# Patient Record
Sex: Male | Born: 1935
Health system: Southern US, Community
[De-identification: ages and names within clinical notes are randomized; demographics above are authoritative.]

## PROBLEM LIST (undated history)

## (undated) DIAGNOSIS — C801 Malignant (primary) neoplasm, unspecified: Secondary | ICD-10-CM

## (undated) DIAGNOSIS — G2581 Restless legs syndrome: Secondary | ICD-10-CM

## (undated) DIAGNOSIS — N400 Enlarged prostate without lower urinary tract symptoms: Secondary | ICD-10-CM

## (undated) DIAGNOSIS — F419 Anxiety disorder, unspecified: Secondary | ICD-10-CM

## (undated) DIAGNOSIS — I10 Essential (primary) hypertension: Secondary | ICD-10-CM

## (undated) DIAGNOSIS — F4310 Post-traumatic stress disorder, unspecified: Secondary | ICD-10-CM

## (undated) DIAGNOSIS — K219 Gastro-esophageal reflux disease without esophagitis: Secondary | ICD-10-CM

## (undated) DIAGNOSIS — R1013 Epigastric pain: Secondary | ICD-10-CM

## (undated) DIAGNOSIS — M1990 Unspecified osteoarthritis, unspecified site: Secondary | ICD-10-CM

## (undated) DIAGNOSIS — I472 Ventricular tachycardia: Secondary | ICD-10-CM

## (undated) DIAGNOSIS — F32A Depression, unspecified: Secondary | ICD-10-CM

## (undated) DIAGNOSIS — R918 Other nonspecific abnormal finding of lung field: Secondary | ICD-10-CM

## (undated) DIAGNOSIS — Z85038 Personal history of other malignant neoplasm of large intestine: Secondary | ICD-10-CM

## (undated) DIAGNOSIS — I4729 Other ventricular tachycardia: Secondary | ICD-10-CM

## (undated) DIAGNOSIS — R51 Headache: Secondary | ICD-10-CM

## (undated) DIAGNOSIS — K5790 Diverticulosis of intestine, part unspecified, without perforation or abscess without bleeding: Secondary | ICD-10-CM

## (undated) DIAGNOSIS — E119 Type 2 diabetes mellitus without complications: Secondary | ICD-10-CM

## (undated) DIAGNOSIS — D494 Neoplasm of unspecified behavior of bladder: Secondary | ICD-10-CM

## (undated) DIAGNOSIS — R519 Headache, unspecified: Secondary | ICD-10-CM

## (undated) DIAGNOSIS — D369 Benign neoplasm, unspecified site: Secondary | ICD-10-CM

## (undated) DIAGNOSIS — F329 Major depressive disorder, single episode, unspecified: Secondary | ICD-10-CM

## (undated) DIAGNOSIS — H409 Unspecified glaucoma: Secondary | ICD-10-CM

## (undated) DIAGNOSIS — D649 Anemia, unspecified: Secondary | ICD-10-CM

## (undated) DIAGNOSIS — G629 Polyneuropathy, unspecified: Secondary | ICD-10-CM

## (undated) HISTORY — DX: Anemia, unspecified: D64.9

## (undated) HISTORY — DX: Gastro-esophageal reflux disease without esophagitis: K21.9

## (undated) HISTORY — DX: Major depressive disorder, single episode, unspecified: F32.9

## (undated) HISTORY — PX: COLONOSCOPY: SHX174

## (undated) HISTORY — PX: JOINT REPLACEMENT: SHX530

## (undated) HISTORY — DX: Essential (primary) hypertension: I10

## (undated) HISTORY — PX: INNER EAR SURGERY: SHX679

## (undated) HISTORY — DX: Depression, unspecified: F32.A

## (undated) HISTORY — PX: COLON SURGERY: SHX602

## (undated) HISTORY — DX: Epigastric pain: R10.13

## (undated) HISTORY — DX: Benign neoplasm, unspecified site: D36.9

## (undated) HISTORY — PX: EYE SURGERY: SHX253

## (undated) HISTORY — DX: Polyneuropathy, unspecified: G62.9

## (undated) HISTORY — DX: Benign prostatic hyperplasia without lower urinary tract symptoms: N40.0

## (undated) HISTORY — DX: Unspecified glaucoma: H40.9

## (undated) HISTORY — DX: Malignant (primary) neoplasm, unspecified: C80.1

## (undated) HISTORY — DX: Type 2 diabetes mellitus without complications: E11.9

## (undated) HISTORY — DX: Unspecified osteoarthritis, unspecified site: M19.90

## (undated) HISTORY — PX: KNEE ARTHROSCOPY: SUR90

## (undated) HISTORY — DX: Personal history of other malignant neoplasm of large intestine: Z85.038

## (undated) HISTORY — DX: Diverticulosis of intestine, part unspecified, without perforation or abscess without bleeding: K57.90

## (undated) HISTORY — DX: Post-traumatic stress disorder, unspecified: F43.10

## (undated) HISTORY — PX: TOTAL KNEE ARTHROPLASTY: SHX125

---

## 1999-02-01 ENCOUNTER — Encounter: Payer: Self-pay | Admitting: Internal Medicine

## 2000-05-09 ENCOUNTER — Encounter: Payer: Self-pay | Admitting: Internal Medicine

## 2001-07-22 ENCOUNTER — Encounter: Payer: Self-pay | Admitting: Internal Medicine

## 2002-04-23 ENCOUNTER — Ambulatory Visit (HOSPITAL_COMMUNITY): Admission: RE | Admit: 2002-04-23 | Discharge: 2002-04-23 | Payer: Self-pay | Admitting: Internal Medicine

## 2002-04-23 ENCOUNTER — Encounter: Payer: Self-pay | Admitting: Internal Medicine

## 2002-08-11 ENCOUNTER — Encounter: Payer: Self-pay | Admitting: Internal Medicine

## 2003-07-15 ENCOUNTER — Encounter: Payer: Self-pay | Admitting: Internal Medicine

## 2003-10-27 ENCOUNTER — Encounter (INDEPENDENT_AMBULATORY_CARE_PROVIDER_SITE_OTHER): Payer: Self-pay | Admitting: Specialist

## 2003-10-27 ENCOUNTER — Inpatient Hospital Stay (HOSPITAL_COMMUNITY): Admission: RE | Admit: 2003-10-27 | Discharge: 2003-11-01 | Payer: Self-pay | Admitting: General Surgery

## 2004-07-10 ENCOUNTER — Ambulatory Visit: Payer: Self-pay | Admitting: Internal Medicine

## 2004-07-31 ENCOUNTER — Encounter: Payer: Self-pay | Admitting: Internal Medicine

## 2004-09-04 ENCOUNTER — Encounter: Payer: Self-pay | Admitting: Internal Medicine

## 2004-10-02 ENCOUNTER — Ambulatory Visit: Payer: Self-pay | Admitting: Internal Medicine

## 2005-01-03 ENCOUNTER — Ambulatory Visit: Payer: Self-pay | Admitting: Internal Medicine

## 2005-01-15 ENCOUNTER — Encounter: Admission: RE | Admit: 2005-01-15 | Discharge: 2005-01-15 | Payer: Self-pay | Admitting: Internal Medicine

## 2005-02-07 ENCOUNTER — Ambulatory Visit: Payer: Self-pay | Admitting: Internal Medicine

## 2005-02-08 ENCOUNTER — Ambulatory Visit: Payer: Self-pay | Admitting: Internal Medicine

## 2005-02-20 ENCOUNTER — Ambulatory Visit: Payer: Self-pay | Admitting: Internal Medicine

## 2005-02-20 ENCOUNTER — Encounter (INDEPENDENT_AMBULATORY_CARE_PROVIDER_SITE_OTHER): Payer: Self-pay | Admitting: Specialist

## 2005-03-27 ENCOUNTER — Emergency Department (HOSPITAL_COMMUNITY): Admission: EM | Admit: 2005-03-27 | Discharge: 2005-03-27 | Payer: Self-pay | Admitting: Emergency Medicine

## 2005-08-28 ENCOUNTER — Encounter: Payer: Self-pay | Admitting: Internal Medicine

## 2006-09-20 ENCOUNTER — Encounter: Payer: Self-pay | Admitting: Internal Medicine

## 2006-11-18 ENCOUNTER — Encounter: Payer: Self-pay | Admitting: Internal Medicine

## 2007-02-05 ENCOUNTER — Ambulatory Visit: Payer: Self-pay | Admitting: Internal Medicine

## 2007-02-05 LAB — CONVERTED CEMR LAB
AST: 35 units/L (ref 0–37)
Albumin: 3.6 g/dL (ref 3.5–5.2)
BUN: 14 mg/dL (ref 6–23)
Basophils Relative: 0.6 % (ref 0.0–1.0)
Chloride: 103 meq/L (ref 96–112)
Creatinine, Ser: 1 mg/dL (ref 0.4–1.5)
Eosinophils Absolute: 0.1 10*3/uL (ref 0.0–0.6)
Eosinophils Relative: 2.2 % (ref 0.0–5.0)
GFR calc Af Amer: 95 mL/min
GFR calc non Af Amer: 78 mL/min
Glucose, Bld: 115 mg/dL — ABNORMAL HIGH (ref 70–99)
HDL: 37.8 mg/dL — ABNORMAL LOW (ref >39.0)
Hemoglobin: 15.1 g/dL (ref 13.0–17.0)
LDL Cholesterol: 134 mg/dL — ABNORMAL HIGH (ref 0–99)
Monocytes Absolute: 0.5 10*3/uL (ref 0.2–0.7)
Neutrophils Relative %: 53.5 % (ref 43.0–77.0)
Potassium: 3.5 meq/L (ref 3.5–5.1)
RDW: 13.8 % (ref 11.5–14.6)
Sodium: 144 meq/L (ref 135–145)
Specific Gravity, Urine: 1.025 (ref 1.000–1.03)
Total CHOL/HDL Ratio: 5
Total Protein, Urine: NEGATIVE mg/dL
Total Protein: 6.7 g/dL (ref 6.0–8.3)
Triglycerides: 81 mg/dL (ref 0–149)
VLDL: 16 mg/dL (ref 0–40)
WBC: 4.4 10*3/uL — ABNORMAL LOW (ref 4.5–10.5)

## 2007-02-11 ENCOUNTER — Ambulatory Visit: Payer: Self-pay | Admitting: Internal Medicine

## 2007-03-11 ENCOUNTER — Ambulatory Visit: Payer: Self-pay | Admitting: Internal Medicine

## 2007-05-23 ENCOUNTER — Encounter: Payer: Self-pay | Admitting: Internal Medicine

## 2007-05-28 ENCOUNTER — Encounter: Payer: Self-pay | Admitting: Internal Medicine

## 2007-08-11 ENCOUNTER — Ambulatory Visit: Payer: Self-pay | Admitting: Internal Medicine

## 2007-08-11 ENCOUNTER — Encounter: Payer: Self-pay | Admitting: Internal Medicine

## 2007-08-11 DIAGNOSIS — J309 Allergic rhinitis, unspecified: Secondary | ICD-10-CM | POA: Insufficient documentation

## 2007-08-11 DIAGNOSIS — K3189 Other diseases of stomach and duodenum: Secondary | ICD-10-CM

## 2007-08-11 DIAGNOSIS — I10 Essential (primary) hypertension: Secondary | ICD-10-CM | POA: Insufficient documentation

## 2007-08-11 DIAGNOSIS — R1013 Epigastric pain: Secondary | ICD-10-CM

## 2007-08-11 DIAGNOSIS — M810 Age-related osteoporosis without current pathological fracture: Secondary | ICD-10-CM | POA: Insufficient documentation

## 2007-08-11 DIAGNOSIS — F4310 Post-traumatic stress disorder, unspecified: Secondary | ICD-10-CM

## 2007-08-11 DIAGNOSIS — M1990 Unspecified osteoarthritis, unspecified site: Secondary | ICD-10-CM | POA: Insufficient documentation

## 2007-08-11 DIAGNOSIS — S62609A Fracture of unspecified phalanx of unspecified finger, initial encounter for closed fracture: Secondary | ICD-10-CM

## 2007-08-11 DIAGNOSIS — N400 Enlarged prostate without lower urinary tract symptoms: Secondary | ICD-10-CM | POA: Insufficient documentation

## 2007-08-11 DIAGNOSIS — G609 Hereditary and idiopathic neuropathy, unspecified: Secondary | ICD-10-CM | POA: Insufficient documentation

## 2007-08-11 DIAGNOSIS — Z85038 Personal history of other malignant neoplasm of large intestine: Secondary | ICD-10-CM | POA: Insufficient documentation

## 2007-08-11 DIAGNOSIS — Z9889 Other specified postprocedural states: Secondary | ICD-10-CM

## 2007-08-17 ENCOUNTER — Encounter: Payer: Self-pay | Admitting: Internal Medicine

## 2007-08-26 ENCOUNTER — Encounter: Payer: Self-pay | Admitting: Internal Medicine

## 2007-09-05 ENCOUNTER — Encounter: Payer: Self-pay | Admitting: Internal Medicine

## 2007-09-15 ENCOUNTER — Encounter: Payer: Self-pay | Admitting: Internal Medicine

## 2007-11-20 ENCOUNTER — Encounter: Payer: Self-pay | Admitting: Internal Medicine

## 2007-12-19 ENCOUNTER — Encounter: Payer: Self-pay | Admitting: Internal Medicine

## 2008-01-27 ENCOUNTER — Ambulatory Visit: Payer: Self-pay | Admitting: Internal Medicine

## 2008-02-17 ENCOUNTER — Encounter: Payer: Self-pay | Admitting: Internal Medicine

## 2008-02-17 ENCOUNTER — Ambulatory Visit: Payer: Self-pay | Admitting: Internal Medicine

## 2008-02-17 DIAGNOSIS — D369 Benign neoplasm, unspecified site: Secondary | ICD-10-CM

## 2008-02-17 HISTORY — DX: Benign neoplasm, unspecified site: D36.9

## 2008-02-19 ENCOUNTER — Encounter: Payer: Self-pay | Admitting: Internal Medicine

## 2008-04-12 ENCOUNTER — Ambulatory Visit: Payer: Self-pay | Admitting: Internal Medicine

## 2008-04-12 DIAGNOSIS — R609 Edema, unspecified: Secondary | ICD-10-CM

## 2008-04-12 DIAGNOSIS — E119 Type 2 diabetes mellitus without complications: Secondary | ICD-10-CM

## 2008-04-15 ENCOUNTER — Encounter: Payer: Self-pay | Admitting: Internal Medicine

## 2008-04-15 LAB — CONVERTED CEMR LAB: Protein, Ur: 48 mg/24hr — ABNORMAL LOW (ref 50–100)

## 2008-04-16 ENCOUNTER — Encounter: Payer: Self-pay | Admitting: Internal Medicine

## 2008-06-15 ENCOUNTER — Ambulatory Visit: Payer: Self-pay | Admitting: Internal Medicine

## 2008-07-09 ENCOUNTER — Emergency Department (HOSPITAL_COMMUNITY): Admission: EM | Admit: 2008-07-09 | Discharge: 2008-07-09 | Payer: Self-pay | Admitting: *Deleted

## 2008-07-14 ENCOUNTER — Encounter: Payer: Self-pay | Admitting: Internal Medicine

## 2008-07-16 ENCOUNTER — Ambulatory Visit: Payer: Self-pay | Admitting: Internal Medicine

## 2008-07-16 DIAGNOSIS — R740 Nonspecific elevation of levels of transaminase and lactic acid dehydrogenase [LDH]: Secondary | ICD-10-CM

## 2008-07-16 DIAGNOSIS — N281 Cyst of kidney, acquired: Secondary | ICD-10-CM

## 2008-07-16 DIAGNOSIS — M109 Gout, unspecified: Secondary | ICD-10-CM

## 2008-07-29 ENCOUNTER — Ambulatory Visit (HOSPITAL_COMMUNITY): Admission: RE | Admit: 2008-07-29 | Discharge: 2008-07-29 | Payer: Self-pay | Admitting: Urology

## 2008-09-21 ENCOUNTER — Encounter: Payer: Self-pay | Admitting: Internal Medicine

## 2008-10-04 ENCOUNTER — Encounter: Payer: Self-pay | Admitting: Internal Medicine

## 2008-10-18 ENCOUNTER — Encounter: Payer: Self-pay | Admitting: Internal Medicine

## 2008-10-29 ENCOUNTER — Encounter: Payer: Self-pay | Admitting: Internal Medicine

## 2008-11-28 ENCOUNTER — Encounter: Payer: Self-pay | Admitting: Internal Medicine

## 2009-02-21 ENCOUNTER — Encounter: Payer: Self-pay | Admitting: Internal Medicine

## 2009-03-18 ENCOUNTER — Encounter: Payer: Self-pay | Admitting: Internal Medicine

## 2009-03-21 ENCOUNTER — Encounter: Payer: Self-pay | Admitting: Internal Medicine

## 2009-04-20 ENCOUNTER — Ambulatory Visit: Payer: Self-pay | Admitting: Internal Medicine

## 2009-04-20 ENCOUNTER — Encounter: Payer: Self-pay | Admitting: Internal Medicine

## 2009-05-08 ENCOUNTER — Telehealth (INDEPENDENT_AMBULATORY_CARE_PROVIDER_SITE_OTHER): Payer: Self-pay | Admitting: *Deleted

## 2009-05-12 ENCOUNTER — Encounter: Payer: Self-pay | Admitting: Internal Medicine

## 2009-05-26 ENCOUNTER — Encounter: Payer: Self-pay | Admitting: Internal Medicine

## 2009-07-11 ENCOUNTER — Encounter: Payer: Self-pay | Admitting: Internal Medicine

## 2009-08-03 ENCOUNTER — Ambulatory Visit: Payer: Self-pay | Admitting: Internal Medicine

## 2009-09-13 ENCOUNTER — Encounter: Payer: Self-pay | Admitting: Internal Medicine

## 2010-01-31 ENCOUNTER — Ambulatory Visit: Payer: Self-pay | Admitting: Internal Medicine

## 2010-03-03 ENCOUNTER — Encounter: Payer: Self-pay | Admitting: Internal Medicine

## 2010-03-13 ENCOUNTER — Encounter: Payer: Self-pay | Admitting: Internal Medicine

## 2010-07-13 ENCOUNTER — Ambulatory Visit: Payer: Self-pay | Admitting: Internal Medicine

## 2010-07-16 ENCOUNTER — Encounter: Payer: Self-pay | Admitting: Internal Medicine

## 2010-07-19 ENCOUNTER — Encounter: Payer: Self-pay | Admitting: Internal Medicine

## 2010-07-19 ENCOUNTER — Telehealth (INDEPENDENT_AMBULATORY_CARE_PROVIDER_SITE_OTHER): Payer: Self-pay | Admitting: *Deleted

## 2010-07-25 ENCOUNTER — Encounter: Payer: Self-pay | Admitting: Internal Medicine

## 2010-07-28 ENCOUNTER — Encounter: Payer: Self-pay | Admitting: Internal Medicine

## 2010-08-04 IMAGING — US US ABDOMEN COMPLETE
1 series · 13 of 25 positions shown · non-contrast
Comparison: Renal ultrasound 01/15/2005

CLINICAL DATA: Fever, jaundice, abdominal pain

ABDOMEN ULTRASOUND
TECHNIQUE: Complete abdominal ultrasound examination was performed
including evaluation of the liver, gallbladder, bile ducts,
pancreas, kidneys, spleen, IVC, and abdominal aorta.

[Series 1: unknown · 0.34mm/px · 13 of 57 slices shown]
[im 1/57]
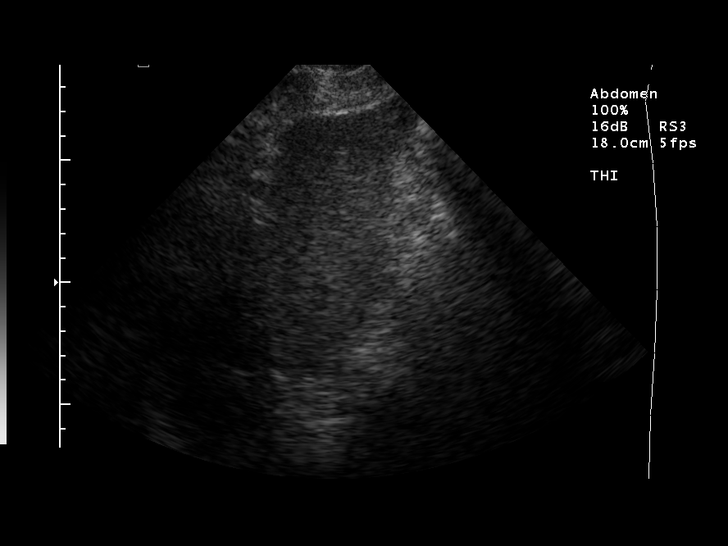
[im 5/57]
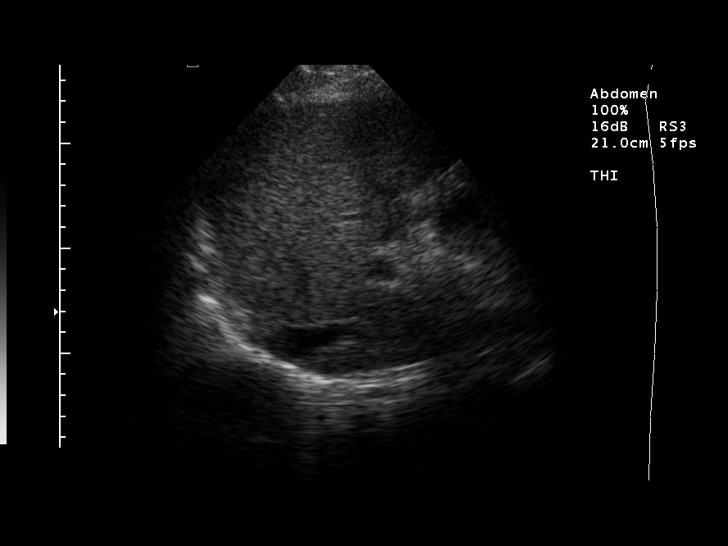
[im 10/57]
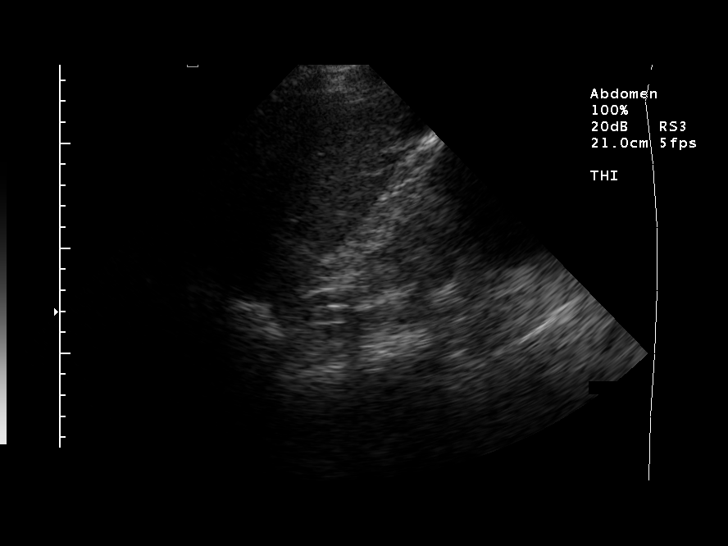
[im 15/57]
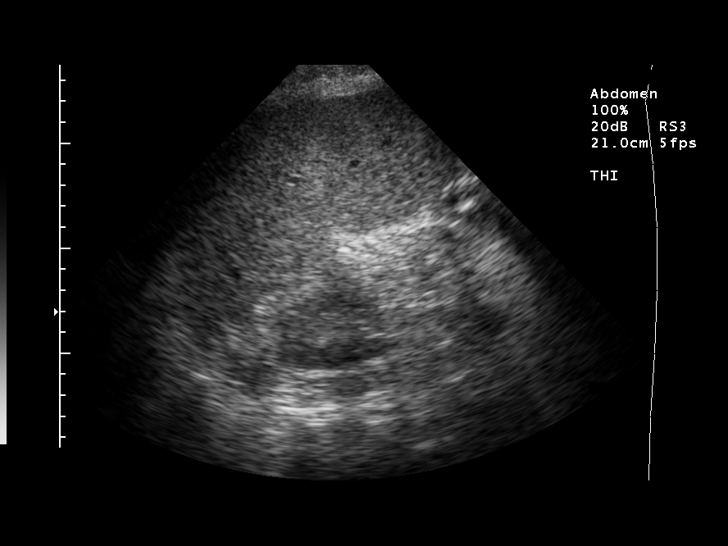
[im 19/57]
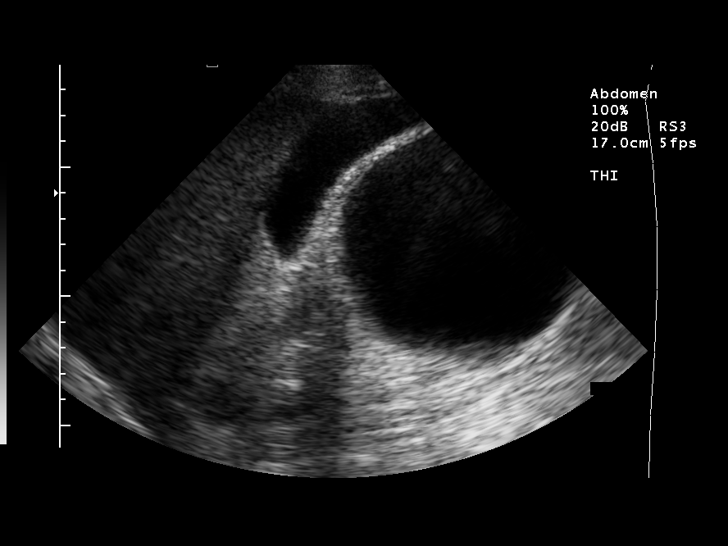
[im 24/57]
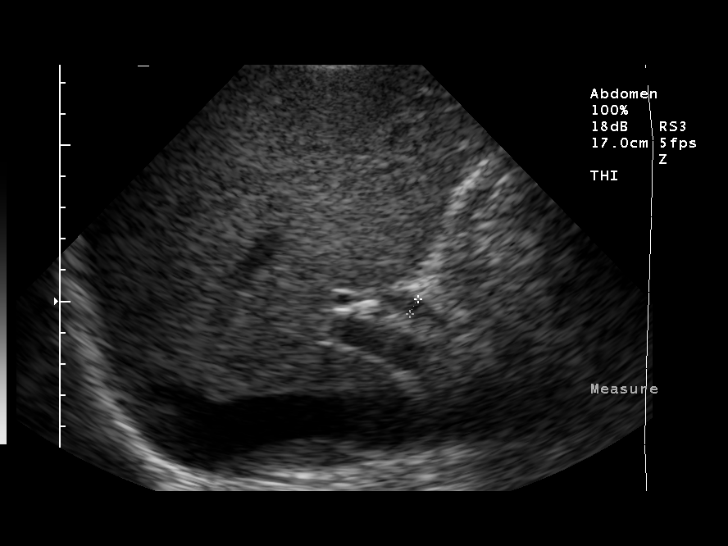
[im 29/57]
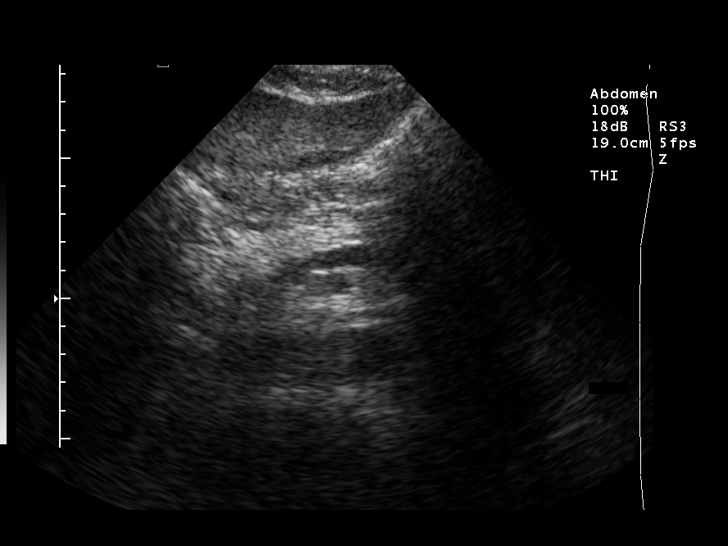
[im 33/57]
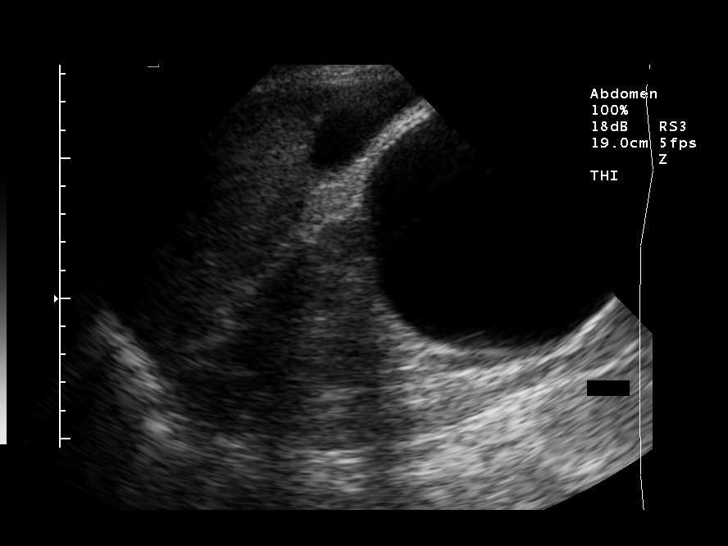
[im 38/57]
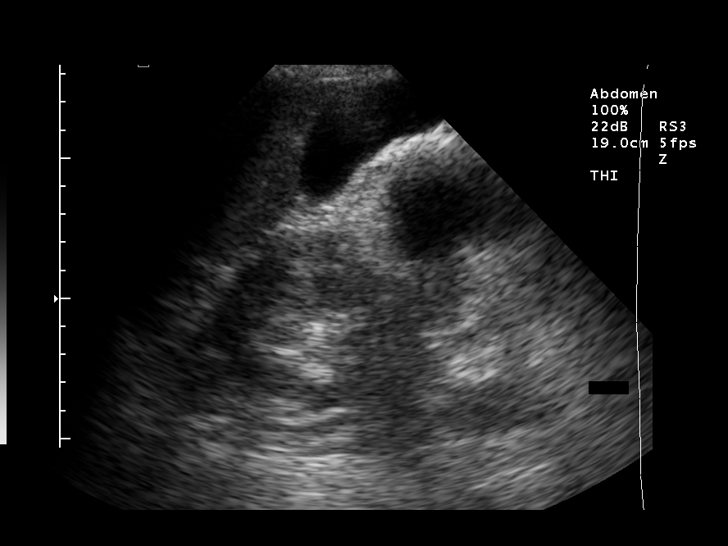
[im 43/57]
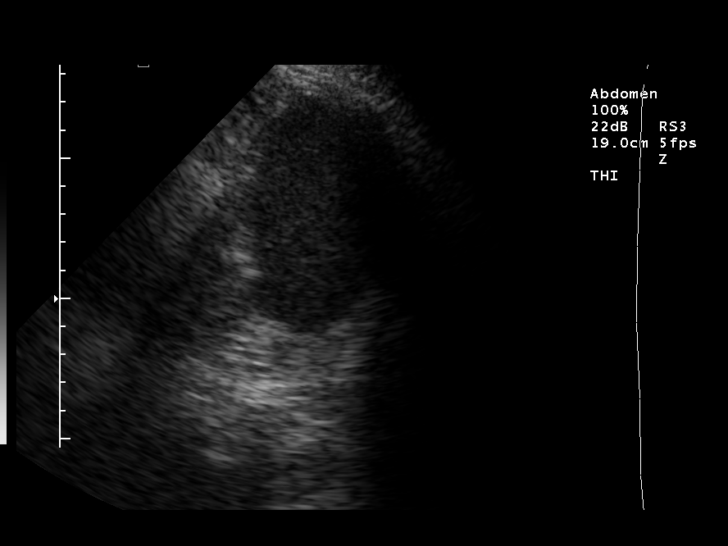
[im 47/57]
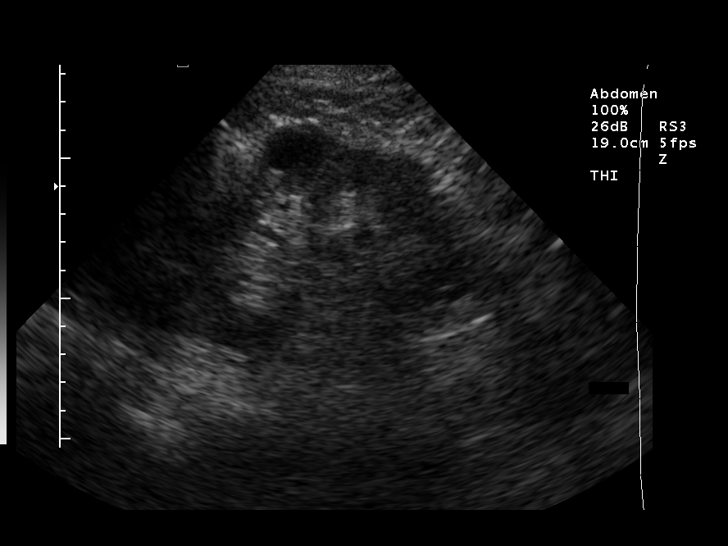
[im 52/57]
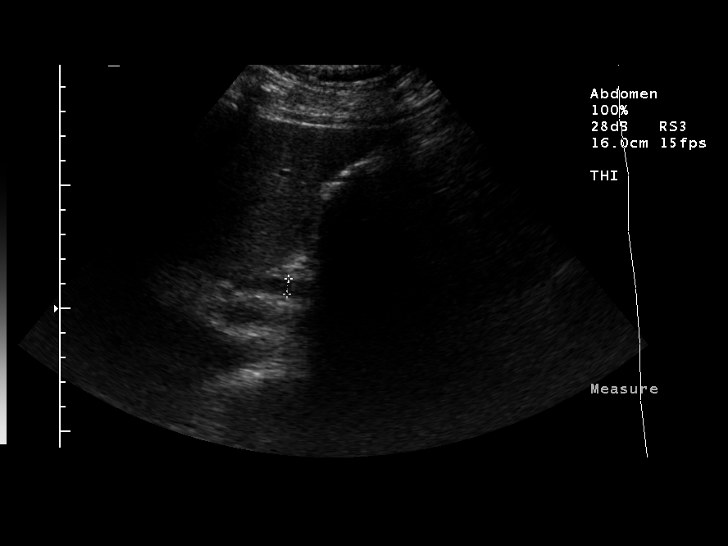
[im 57/57]
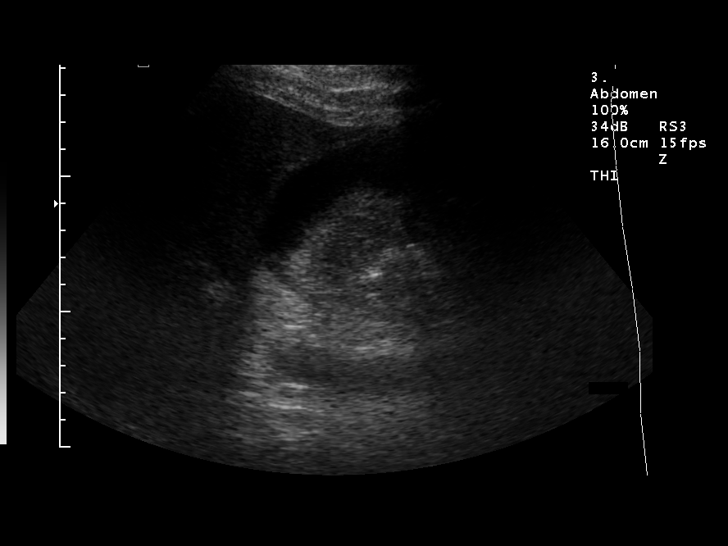

[13 of 25 positions shown; findings below may reference images not displayed]

FINDINGS: Gallbladder normal without stones or wall thickening.
Common bile duct normal caliber for age, 6.3 mm diameter.
Echogenic liver, likely fatty infiltration, though this can be seen
with cirrhosis and certain infiltrative disorders.
No focal hepatic mass or intrahepatic biliary dilatation.
Suboptimal pancreatic and aortic visualization due to bowel gas.
Spleen unremarkable, 9.1 cm length.
Kidneys measure 12.3 cm length right and 4.6 cm left.
Large right renal cyst, 10.1 x 8.9 x 10.0 cm, with wall thickening
and question debris dependently.
This lesion has increased in size since 3449 when it measured 8 cm
greatest diameter.
Small cyst mid left kidney stable, 2.4 x 2.0 x 2.2 cm.
IVC unremarkable.
No ascites.
IMPRESSION: Echogenic liver, question fatty infiltration as above.
Suboptimal pancreatic and aortic visualization.
Increased size of a mildly complicated cystic lesion of the right
kidney, now 10.1 cm greatest diameter, previously 8 cm in 3449,
recommend routine / non emergent follow-up multi phase MR or CT
characterization pre/post contrast.

## 2010-08-22 ENCOUNTER — Telehealth (INDEPENDENT_AMBULATORY_CARE_PROVIDER_SITE_OTHER): Payer: Self-pay | Admitting: *Deleted

## 2010-08-24 ENCOUNTER — Encounter: Payer: Self-pay | Admitting: Internal Medicine

## 2010-09-03 ENCOUNTER — Encounter: Payer: Self-pay | Admitting: Internal Medicine

## 2010-09-14 NOTE — Medication Information (Signed)
Summary: [facility]  [facility]   Imported By: Lennie Odor 07/27/2010 10:47:50  _____________________________________________________________________  External Attachment:    Type:   Image     Comment:   External Document

## 2010-09-14 NOTE — Medication Information (Signed)
Summary: [facility]  [facility]   Imported By: Lester [place] 08/01/2010 09:18:46  _____________________________________________________________________  External Attachment:    Type:   Image     Comment:   External Document

## 2010-09-14 NOTE — Medication Information (Signed)
Summary: [facility]  [facility]   Imported By: Sherian Rein 08/29/2010 11:33:35  _____________________________________________________________________  External Attachment:    Type:   Image     Comment:   External Document

## 2010-09-14 NOTE — Progress Notes (Signed)
  Phone Note Other Incoming   Request: Send information Summary of Call: Received request for document to be completed by Physician. [facility] document forwarded to Dr. Debby Bud.

## 2010-09-14 NOTE — Progress Notes (Signed)
  Phone Note Other Incoming   Request: Send information Summary of Call: Request for records received from [employer]. Request forwarded to [employer].

## 2010-09-14 NOTE — Assessment & Plan Note (Signed)
Summary: REFILL AND REFERRALS/[facility]   Vital Signs:  Patient profile:   77 year old male Height:      74 inches Weight:      218 pounds BMI:     28.09 O2 Sat:      97 % on Room air Temp:     98.4 degrees F oral Pulse rate:   75 / minute BP sitting:   138 / 80  (left arm) Cuff size:   large  Vitals Entered By: Bill Salinas CMA (January 31, 2010 1:14 PM)  O2 Flow:  Room air CC: follow-up visit/ pt needs a refill on boniva   Primary Care Provider:  Khylie Larmore  CC:  follow-up visit/ pt needs a refill on boniva.  History of Present Illness: Patient presents for follow-up. He is saying he needs a neurologist:   Pt had Life screening - had normal Ankle - brachial index - no peripheral vascular disease to explain paresthesia in left leg.   He continues to have weakness and paresthesia on the left side. Today he is using a platform cane for ambulation assist. He feels that his condition has been progressive since his last visit in December '10.  He had a fall and struck the side of his head and since then he has had drainage from the right ear.    Current Medications (verified): 1)  Omeprazole 20 Mg  Tbec (Omeprazole) .... Take 1 Tablet By Mouth Once A Day 2)  Chlorpheniramine Maleate 4 Mg  Tabs (Chlorpheniramine Maleate) .... Take 1 Tablet By Mouth Once A Day 3)  Calcium 500 500 Mg  Tabs (Calcium Carbonate) .... Take 1 Tablet By Mouth Once A Day 4)  Hydrochlorothiazide 25 Mg  Tabs (Hydrochlorothiazide) .... Take 1 Tablet By Mouth Once A Day 5)  Salsalate 750 Mg  Tabs (Salsalate) .... Take 1 Tablet By Mouth Once A Day 6)  Fish Oil 1000 Mg  Caps (Omega-3 Fatty Acids) .... Take 1 Tablet By Mouth Once A Day 7)  Effexor 37.5 Mg  Tabs (Venlafaxine Hcl) .... Take 1 Tablet By Mouth Once A Day 8)  Viagra 100 Mg  Tabs (Sildenafil Citrate) .... As Needed 9)  Boniva 150 Mg  Tabs (Ibandronate Sodium) .... Use As Directed 10)  Lisinopril 40 Mg Tabs (Lisinopril) .... Take 1/2 Tablet By Mouth Once A  Day 11)  Metformin Hcl 500 Mg Tabs (Metformin Hcl) .... Take 1 Tablet By Mouth Two Times A Day 12)  Indomethacin 50 Mg Caps (Indomethacin) .Marland Kitchen.. 1 Cap Q 6 Hours As Needed 13)  Clonazepam 0.5 Mg Tabs (Clonazepam) .Marland Kitchen.. 1 Tab Q 6 Hours As Needed  Allergies (verified): No Known Drug Allergies  Past History:  Past Medical History: Last updated: 04/12/2008 Colon cancer, hx of Hypertension Osteoporosis Peripheral neuropathy Allergic rhinitis Benign prostatic hypertrophy POSTTRAUMATIC STRESS DISORDER (ICD-309.81) DEGENERATIVE JOINT DISEASE (ICD-715.90) DYSPEPSIA (ICD-536.8) Diabetes mellitus, type II Torn posterior horn medial meniscus - by MRI Oct '00 Hearing loss - bilateral  Past Surgical History: Last updated: 08/11/2007 * RIGHT HEMICOLECTOMY FOR COLON CANCER MARCH 2005 * HEEL SURGERY OTHER POSTSURGICAL STATUS OTHER (ICD-V45.89) FRACTURE, INDEX FINGER, RIGHT (ICD-816.00) ARTHROSCOPY, RIGHT KNEE, HX OF (ICD-V45.89)  Family History: Last updated: July 21, 2008 father - deceased @72 : lung cancer,  mother - deceased @ 44: cancer death-breast Neg- colon cancer; CAD/MI Brother - prostate cancer Sister with pancreatic cancer 2 sisters with breast cancer.  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  Risk Factors: Caffeine Use: 2 (04/12/2008) Exercise: no (04/12/2008)  Risk Factors: Smoking Status: quit (08/11/2007)  Review of Systems       The patient complains of fever, decreased hearing, dyspnea on exertion, abdominal pain, incontinence, and difficulty walking.  The patient denies anorexia, weight loss, weight gain, vision loss, hoarseness, chest pain, syncope, prolonged cough, hemoptysis, melena, hematochezia, severe indigestion/heartburn, hematuria, genital sores, transient blindness, unusual weight change, abnormal  bleeding, enlarged lymph nodes, and angioedema.         Night sweats  Physical Exam  General:  Well nourished heavy set AA male looking younger than his stated age.  Head:  Normocephalic and atraumatic without obvious abnormalities. No apparent alopecia or balding. Eyes:  vision grossly intact, pupils equal, pupils round, corneas and lenses clear, and no injection.   Ears:  TM's normal. Cerumen and question of blood in right EAC, left EAC clear Nose:  no external deformity and no external erythema.   Mouth:  edentulous with no buccal lesions Neck:  supple, full ROM, no thyromegaly, and no carotid bruits.   Chest Wall:  no deformities.   Lungs:  Normal respiratory effort, chest expands symmetrically. Lungs are clear to auscultation, no crackles or wheezes. Heart:  Normal rate and regular rhythm. S1 and S2 normal without gallop, murmur, click, rub or other extra sounds. Abdomen:  soft, non-tender, normal bowel sounds, no distention, no guarding, no rigidity, and no hepatomegaly.   Prostate:  deferred Msk:  normal ROM, no joint tenderness, no joint swelling, no joint warmth, and no redness over joints.  Mild enlargement of DIP joints hand, especially index finger right Pulses:  2+ radial and dorsalis pedis pulses Extremities:  no peripheral edema on exam today Neurologic:  alert & oriented X3, cranial nerves II-XII intact, and strength normal in all extremities.  decreased sensation to light touch distal lower extremities, left worse than right. Poor soft dull discrimination distal left LE. Diminished deep vibratory sensation bilateral distal lower extremities. MInimal decrease in deep vibratory sensation hands left worse than right. DTRs 2+ equal throughout Skin:  turgor normal, color normal, no rashes, no suspicious lesions, and no ulcerations.   Cervical Nodes:  no anterior cervical adenopathy and no posterior cervical adenopathy.   Psych:  Oriented X3, memory intact for recent and remote,  normally interactive, good eye contact, and not anxious appearing.     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 No recent lab. [facility]  His updated medication list for this problem includes:    Salsalate 750 Mg Tabs (Salsalate) .Marland Kitchen... Take 1 tablet by mouth once a day    Lisinopril 40 Mg Tabs (Lisinopril) .Marland Kitchen... Take 1/2 tablet by mouth once a day    Metformin Hcl 500 Mg Tabs (Metformin hcl) .Marland Kitchen... Take 1 tablet by mouth two times a day  Problem # 3:  POSTTRAUMATIC STRESS DISORDER (ICD-309.81) Established diagnosis. Seems emotionally stable. Has follow-up with private psychiatrist  Problem # 4:  DEGENERATIVE JOINT DISEASE (ICD-715.90) No inflammed joints on exam today. Symptoms well controlled with present medications.   His updated medication list for this problem includes:    Salsalate 750 Mg Tabs (Salsalate) .Marland Kitchen... Take 1 tablet by mouth once a day    Indomethacin  50 Mg Caps (Indomethacin) .Marland Kitchen... 1 cap q 6 hours as needed  Problem # 5:  PERIPHERAL NEUROPATHY (ICD-356.9) Exam as noted with what appears to be increased neuropathy, i.e. progressive decrease in sensation and deep vibratory sensation. Strength seems to be in normal range.  Orders: Neurology Referral (Neuro)  Problem # 6:  HYPERTENSION (ICD-401.9)  His updated medication list for this problem includes:    Hydrochlorothiazide 25 Mg Tabs (Hydrochlorothiazide) .Marland Kitchen... Take 1 tablet by mouth once a day    Lisinopril 40 Mg Tabs (Lisinopril) .Marland Kitchen... Take 1/2 tablet by mouth once a day  BP today: 138/80 Prior BP: 132/86 (08/03/2009)  Adequate control on present medications.   Complete Medication List: 1)  Omeprazole 20 Mg Tbec (Omeprazole) .... Take 1 tablet by mouth once a day 2)  Chlorpheniramine Maleate 4 Mg Tabs (Chlorpheniramine  maleate) .... Take 1 tablet by mouth once a day 3)  Calcium 500 500 Mg Tabs (Calcium carbonate) .... Take 1 tablet by mouth once a day 4)  Hydrochlorothiazide 25 Mg Tabs (Hydrochlorothiazide) .... Take 1 tablet by mouth once a day 5)  Salsalate 750 Mg Tabs (Salsalate) .... Take 1 tablet by mouth once a day 6)  Fish Oil 1000 Mg Caps (Omega-3 fatty acids) .... Take 1 tablet by mouth once a day 7)  Effexor 37.5 Mg Tabs (Venlafaxine hcl) .... Take 1 tablet by mouth once a day 8)  Viagra 100 Mg Tabs (Sildenafil citrate) .... As needed 9)  Boniva 150 Mg Tabs (Ibandronate sodium) .... Use as directed 10)  Lisinopril 40 Mg Tabs (Lisinopril) .... Take 1/2 tablet by mouth once a day 11)  Metformin Hcl 500 Mg Tabs (Metformin hcl) .... Take 1 tablet by mouth two times a day 12)  Indomethacin 50 Mg Caps (Indomethacin) .Marland Kitchen.. 1 cap q 6 hours as needed 13)  Clonazepam 0.5 Mg Tabs (Clonazepam) .Marland Kitchen.. 1 tab q 6 hours as needed Prescriptions: BONIVA 150 MG  TABS (IBANDRONATE SODIUM) use as directed  #3 x 3   Entered and Authorized by:   Jacques Navy MD   Signed by:   Jacques Navy MD on 01/31/2010   Method used:   Electronically to        [facility] (retail)       [address]       [place], [place]  [address]       Ph: [phone]       Fax: [phone]   RxID:   [id number]

## 2010-09-14 NOTE — Medication Information (Signed)
Summary: [facility]  [facility]   Imported By: Sherian Rein 07/28/2010 12:03:46  _____________________________________________________________________  External Attachment:    Type:   Image     Comment:   External Document

## 2010-09-14 NOTE — Letter (Signed)
[facility]   Imported By: Lennie Odor 09/26/2009 16:57:31  _____________________________________________________________________  External Attachment:    Type:   Image     Comment:   External Document

## 2010-09-14 NOTE — Medication Information (Signed)
[facility]: [facility]   Imported By: Lester Pollard 07/19/2010 10:27:30  _____________________________________________________________________  External Attachment:    Type:   Image     Comment:   External Document

## 2010-09-14 NOTE — Medication Information (Signed)
Summary: [facility]  [facility]   Imported By: Lester Franquez 07/24/2010 08:06:39  _____________________________________________________________________  External Attachment:    Type:   Image     Comment:   External Document

## 2010-09-14 NOTE — Consult Note (Signed)
Summary: [facility]  [facility]   Imported By: Sherian Rein 03/07/2010 09:17:13  _____________________________________________________________________  External Attachment:    Type:   Image     Comment:   External Document

## 2010-09-14 NOTE — Assessment & Plan Note (Signed)
Summary: PER PT INHOME SERVICE EVAL--PT HAVING KNEE SURG--STC   Vital Signs:  Patient profile:   77 year old male Height:      74 inches Weight:      215 pounds BMI:     27.70 O2 Sat:      97 % on Room air Temp:     97.9 degrees F oral Pulse rate:   81 / minute BP sitting:   146 / 88  (left arm) Cuff size:   large  Vitals Entered By: Bill Salinas CMA (July 13, 2010 3:47 PM)  O2 Flow:  Room air  Primary Care Provider:  [facility]   History of Present Illness: Patient presents for administrative purposes: he needs up-dated certification of his disability status. He also seeks assistance in making qualified application for in-home non-medical services. He is informed that there is no medicare benefit for this but that the [place] will provide assistance in this regard. He already has started the [place] process. He does state that he is going to have TKR and will need in-home PT following surgery. This can be arranged at the time of hospital discharge.  Current Medications (verified): 1)  Omeprazole 20 Mg  Tbec (Omeprazole) .... Take 1 Tablet By Mouth Once A Day 2)  Chlorpheniramine Maleate 4 Mg  Tabs (Chlorpheniramine Maleate) .... Take 1 Tablet By Mouth Once A Day 3)  Calcium 500 500 Mg  Tabs (Calcium Carbonate) .... Take 1 Tablet By Mouth Once A Day 4)  Hydrochlorothiazide 25 Mg  Tabs (Hydrochlorothiazide) .... Take 1 Tablet By Mouth Once A Day 5)  Salsalate 750 Mg  Tabs (Salsalate) .... Take 1 Tablet By Mouth Once A Day 6)  Fish Oil 1000 Mg  Caps (Omega-3 Fatty Acids) .... Take 1 Tablet By Mouth Once A Day 7)  Effexor 37.5 Mg  Tabs (Venlafaxine Hcl) .... Take 1 Tablet By Mouth Once A Day 8)  Viagra 100 Mg  Tabs (Sildenafil Citrate) .... As Needed 9)  Boniva 150 Mg  Tabs (Ibandronate Sodium) .... Use As Directed 10)  Lisinopril 40 Mg Tabs (Lisinopril) .... Take 1/2 Tablet By Mouth Once A Day 11)  Metformin Hcl 500 Mg Tabs (Metformin Hcl) .... Take 1 Tablet By Mouth Two Times A Day 12)   Indomethacin 50 Mg Caps (Indomethacin) .Marland Kitchen.. 1 Cap Q 6 Hours As Needed 13)  Clonazepam 0.5 Mg Tabs (Clonazepam) .Marland Kitchen.. 1 Tab Q 6 Hours As Needed  Allergies (verified): No Known Drug Allergies  Past History:  Past Medical History: Last updated: 04/12/2008 Colon cancer, hx of Hypertension Osteoporosis Peripheral neuropathy Allergic rhinitis Benign prostatic hypertrophy POSTTRAUMATIC STRESS DISORDER (ICD-309.81) DEGENERATIVE JOINT DISEASE (ICD-715.90) DYSPEPSIA (ICD-536.8) Diabetes mellitus, type II Torn posterior horn medial meniscus - by MRI Oct '00 Hearing loss - bilateral  Past Surgical History: Last updated: 08/11/2007 * RIGHT HEMICOLECTOMY FOR COLON CANCER MARCH 2005 * HEEL SURGERY OTHER POSTSURGICAL STATUS OTHER (ICD-V45.89) FRACTURE, INDEX FINGER, RIGHT (ICD-816.00) ARTHROSCOPY, RIGHT KNEE, HX OF (ICD-V45.89)  Family History: Last updated: 2008/07/27 father - deceased @72 : lung cancer,  mother - deceased @ 54: cancer death-breast Neg- colon cancer; CAD/MI Brother - prostate cancer Sister with pancreatic cancer 2 sisters with breast cancer.  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  Review of Systems       The patient complains of genital sores and difficulty walking.  The patient denies anorexia, fever, weight loss, weight gain, vision loss, decreased hearing, chest pain, peripheral edema, abdominal pain, severe indigestion/heartburn, suspicious skin lesions, depression, abnormal bleeding, and enlarged lymph nodes.    Physical Exam  General:  well nourished very vocal AA male in a power scouter Head:  normocephalic and atraumatic.   Eyes:  pupils equal and pupils round.   Lungs:  normal respiratory effort.   Heart:  normal rate and regular rhythm.   Pulses:  2+ Neurologic:  alert & oriented X3.     Skin:  turgor normal and color normal.   Psych:  Oriented X3, memory intact for recent and remote, normally interactive, and good eye contact.     Impression & Recommendations:  Problem # 1:  ENCOUNTERS OTHER SPEC ADMINISTRATIVE PURPOSE OTH (ICD-V68.89) Patinet's primary reason for coming in this visit is to up-date certification of previously documented change in disability status that has been documented in the previous [facility] determinations and neurologic evaluations, including NCS/EMG testing.   Plan - letter provided.   ( greater than 50% of 30 minutes spent in counseling and coordination of benefits.)  Complete Medication List: 1)  Omeprazole 20 Mg Tbec (Omeprazole) .... Take 1 tablet by mouth once a day 2)  Chlorpheniramine Maleate 4 Mg Tabs (Chlorpheniramine maleate) .... Take 1 tablet by mouth once a day 3)  Calcium 500 500 Mg Tabs (Calcium carbonate) .... Take 1 tablet by mouth once a day 4)  Hydrochlorothiazide 25 Mg Tabs (Hydrochlorothiazide) .... Take 1 tablet by mouth once a day 5)  Salsalate 750 Mg Tabs (Salsalate) .... Take 1 tablet by mouth once a day 6)  Fish Oil 1000 Mg Caps (Omega-3 fatty acids) .... Take 1 tablet by mouth once a day 7)  Effexor 37.5 Mg Tabs (Venlafaxine hcl) .... Take 1 tablet by mouth once a day 8)  Viagra 100 Mg Tabs (Sildenafil citrate) .... As needed 9)  Boniva 150 Mg Tabs (Ibandronate sodium) .... Use as directed 10)  Lisinopril 40 Mg Tabs (Lisinopril) .... Take 1/2 tablet by mouth once a day 11)  Metformin Hcl 500 Mg Tabs (Metformin hcl) .... Take 1 tablet by mouth two times a day 12)  Indomethacin 50 Mg Caps (Indomethacin) .Marland Kitchen.. 1 cap q 6 hours as needed 13)  Clonazepam 0.5 Mg Tabs (Clonazepam) .Marland Kitchen.. 1 tab q 6 hours as needed   Orders Added: 1)  Est. Patient Level III [29562]

## 2010-09-14 NOTE — Letter (Signed)
Summary: Generic Letter  [facility]  [address] [place], [place] [address]   Phone: [phone]  Fax: [phone]    07/16/2010  [name] [address] [place], [place]  [address]  Dear Mr. Botkins,  In regard to your peripherial neuropathy this is to certify and confirm the findings of your annual physical exam. This in addition to previous documentation by other agencies, with copies provided to me by you, of a change in your limitations per expert examiners.  There is a persistent and progressive peripheral neuropathy affecting the left leg and the leg upper extremity. It is my estimation, without benefit of disability determination training, the you have a 10% loss of sensation in the left lower extremity and a 20% loss of sensation in the left proximal upper extremity.  If I can provide any additional information please do not hesitate to contact me.   Sincerely,           Sincerely,   Illene Regulus MD

## 2010-10-17 ENCOUNTER — Encounter: Payer: Self-pay | Admitting: Internal Medicine

## 2010-10-24 NOTE — Letter (Signed)
Summary: [facility]  [facility]   Imported By: Lennie Odor 10/20/2010 10:40:50  _____________________________________________________________________  External Attachment:    Type:   Image     Comment:   External Document

## 2010-12-26 NOTE — Assessment & Plan Note (Signed)
[facility]                           PRIMARY CARE OFFICE NOTE   NAME:[name], [name]                       MRN:          [id number]  DATE:02/11/2007                            DOB:          27-Dec-1932    [name] is a 77 year old African American gentleman I have followed  by many years who presents for followup evaluation and exam.  He was  last seen in the office February 08, 2005 for completion of disability  paperwork.  In the interval, the patient reports that he has been  relatively well, but has undergone reoperation on his right ear for  decreased hearing, and he has had hearing aids provided.  Patient has  had left eye cataract extraction with right eye extraction planned for  August.  Patient has had reevaluation for his neuropathy with full  evaluation and nerve conduction study performed by [name], M.D.  [facility] in November 2007 confirming his peripheral  neuropathy at L5/S1 level.  Patient also continues to be treated for his  posttraumatic stress disorder.   PAST MEDICAL HISTORY:   SURGERIES:  1. Right knee arthroscopy.  2. Right hemicolectomy March 2005 for colon cancer.  3. Reconstructive ear surgery on the right.  4. Heel surgery.  5. Index finger surgery.   MEDICAL ILLNESSES:  1. Usual childhood diseases.  2. Severe degenerative joint disease exacerbated by parachute jumps.  3. Hypertension.  4. Dyspepsia.  5. Osteoporosis.  6. History of colon cancer.  7. Peripheral neuropathy.  8. BPH.  9. Posttraumatic stress disorder.  10.Allergic rhinitis.   CURRENT MEDICATIONS:  1. Omeprazole 20 mg q.a.m.  2. Chlorphentermine 4 mg daily.  3. Calcium 500 mg daily.  4. __________ 70 mg weekly.  5. Hydrochlorothiazide 25 mg daily.  6. Salicylate 750 mg daily.  7. Fish oil.  8. Effexor 37.5 mg daily.   FAMILY HISTORY:  Noncontributory.   SOCIAL HISTORY:  Patient is fully retired from the [employer]. 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  He also out on disability in regards to his  peripheral neuropathy and degenerative joint disease that is service  related.  He is very active in helping other veterans in regards to  their disabilities and posttraumatic stress disorder.   CHART REVIEW:  Patient's last colonoscopy February 20, 2005 with  diverticulosis of the colon, benign neoplasia of the rectum with a 5-mm  rectal polyp removed.  Patient's last stress Cardiolite study March 23, 2004 read out as a normal study with an EF of 60%.  Last hospitalization  on our record in 2005, hemicolectomy with Casimiro Needle __________ .   RADIOLOGY REVIEW:  Patient had an ultrasound of the kidney January 15, 2005  which revealed 8-cm simple cyst in the lower pole of the right kidney  with 2 hyperechoic lower pole lesions in the left kidney.   Last bone density study in our office from May 06, 2002 with a T  score at the PA spine of -2.027, at the left hip -0.73, at the left  femoral neck -2.109.   Reviewing outside information,  patient had x-ray studies November 18, 2006.  PA and lateral chest, which showed minimal degenerative changes in the  thoracic spine with dorsal kyphosis, mild underlying COPD with linear  scarring.  Knee films, which revealed degenerative arthritis involving  the medial and patellofemoral knee joint compartments as described more  advanced than previous study, that was right knee.  Left knee standing,  minimal degenerative change involving the patellofemoral joint.   REVIEW OF SYSTEMS:  Patient has had no fever, sweats, chills, or weight  changes.  __________ as above with patient followed at [facility] with cataracts, having had a cataract extraction left,  cataract extraction on the right planned for August.  No ENT complaints,  although he has had recent surgery, and of course he is adjusting to his  hearing aids with some significant hearing problems.  No cardiovascular,   respiratory, GI, or GU complaints.  Patient is up to date with Dr. Darvin Neighbours for GU followup.  Musculoskeletal well documented with significant  degenerative joint disease affecting left knee greater than right.  No  dermatologic problems.  Neurologic is significant for patient's known  peripheral neuropathy well documented by the [facility]  physicians with nerve conduction studies.  Psychiatric is significant  for posttraumatic stress disorder, which the patient continues to be  seen for on a regular basis.  Patient does admit to ongoing bouts of  depression and anxiety.   EXAMINATION:  Temperature was 98.8.  Blood pressure 131/79.  Pulse 92.  Weight 226.  Height 6 feet 2 inches.  GENERAL APPEARANCE:  This is a mildly overweight muscular gentleman  looking younger than his stated chronologic age, in no acute distress.  HEENT EXAM:  Normocephalic and atraumatic.  Right EAC was clear with  obvious surgical changes to the tympanic membrane.  Patient had a  hearing aid in the left ear, and therefore, not examined.  Oropharynx  with no lesions on the buccal membranes.  Posterior pharynx was clear.  Conjunctivae and sclerae were clear.  Further examination of the eyes  deferred to Ophthalmology.  NECK:  Supple without thyromegaly.  NODES:  No adenopathy was noted in the cervical or supraclavicular  regions.  CHEST:  No deformities noted.  Normal AP diameter was noted.  LUNGS:  Clear with no rales, wheezes, or rhonchi.  CARDIOVASCULAR:  Two plus radial pulses.  No JVD.  No carotid bruits.  He had a quiet precordium with a regular rate and rhythm without  murmurs, rubs, or gallops.  ABDOMEN:  Soft.  No guarding or rebound.  No organosplenomegaly was  appreciated.  GENITALIA, RECTAL, PROSTATE:  Exam referred to Dr. Darvin Neighbours.  EXTREMITIES:  Patient has no clubbing.  No cyanosis.  No obvious  deformity.  Formal joint testing was deferred to [facility] examinations.  NEUROLOGIC EXAM:   Patient is awake, alert, oriented to person, place,  time, and context.  Cranial nerves 2 through 12 were grossly intact.  Motor strength was grossly normal with the patient having normal gait,  station.  He had normal step-up to the exam table without assistance  required.  Patient had 2+ DTRs at the patellar tendons.  Sensory testing  was not performed due to recent rehab evaluation with nerve conduction  studies.   DATABASE:  Patient had 12-lead echocardiogram which revealed normal  sinus rhythm, and a normal EKG.  Laboratory in our office from February 05, 2007 revealed a hemoglobin of 15.1 gm, white count was 4.4  with a normal  differential.  Chemistries were remarkable for serum glucose of 115.  Electrolytes were normal.  Renal function normal with a creatinine of 1.  Liver functions were normal.  Cholesterol was 188, triglycerides 81, HDL  37.8, LDL 134.  Thyroid function was normal with a TSH of 2.09.  PSA was  normal 0.89.  Urinalysis was negative.   ASSESSMENT AND PLAN:  1. Blood pressure is adequately controlled on patient's present      regimen, which is hydrochlorothiazide alone.  He will continue the      same.  2. Neurologic.  Patient with a known peripheral neuropathy for which      he has right leg weakness at times, particularly with prolonged      sitting.  This does limit his activities.  Patient has a mild      peripheral neuropathy also affecting his right upper extremity.  No      further evaluation is directed from this office, and will defer to      the [facility] for his ongoing evaluations and      treatment.  3. Gastrointestinal.  Patient is stable on omeprazole.  4. Osteoporosis.  Patient does want to switch to once-a-month Boniva,      which we will be happy to accommodate, although prescription was      forgotten at today's visit.  Patient will be a candidate for      followup bone density study at his convenience.  5. Oncology.  Patient is a  colon cancer survivor, doing well at this      time with recent colonoscopy as noted.  6. Genitourinary.  The patient is followed by Dr. Darvin Neighbours for benign      prostatic hypertrophy, and is generally doing well at this time.      Of note, his PSA was normal.  7. Ophthalmology.  Patient is status post cataract extraction left.      Extraction is planned for the right.  8. Psycho/Social.  Patient does seem to be stable and doing well.  He      does continue with ongoing psychiatric care.   SUMMARY:  This is a gentleman with multiple medical problems.  He  carries his burden well.  At this point, with his multiple medical  problems, I believe he does have significant limitations in his  activities preventing him from doing the normal activities he wishes to  pursue.  I believe his condition is unchanged significantly from  previous visits.   Patient is asked to follow up with the [facility] for  immunization, and I would recommend pneumonia vaccine as well as  Zostavax, tetanus if it is not up to date.   [name] is asked to return to see me on a p.r.n. basis.     Rosalyn Gess Norins, MD  Electronically Signed    MEN/MedQ  DD: 02/12/2007  DT: 02/12/2007  Job #: [id number]   cc:   Lamonte Richer. Earlene Plater, M.D.  Deidre Ala, M.D.

## 2010-12-29 NOTE — Op Note (Signed)
[name], [name]                          ACCOUNT NO.:  [id number]   MEDICAL RECORD NO.:  [id number]                   PATIENT TYPE:  INP   LOCATION:  0010                                 FACILITY:  [facility]   PHYSICIAN:  Gita Kudo, M.D.              DATE OF BIRTH:  10-07-1932   DATE OF PROCEDURE:  10/27/2003  DATE OF DISCHARGE:                                 OPERATIVE REPORT   OPERATION/PROCEDURE:  Right colon resection.   SURGEON:  Gita Kudo, M.D.   ASSISTANT:  Lebron Conners, M.D.   ANESTHESIA:  General endotracheal.   PREOPERATIVE DIAGNOSIS:  Polyps, right colon, TVA, also adenomatous polyps.   POSTOPERATIVE DIAGNOSIS:  Polyps, right colon, TVA, also adenomatous polyps.   CLINICAL SUMMARY:  A 77 year old male had routine colonoscopy showing  multiple lesions in his right colon as well as the cecum.  Biopsies by Dr.  Leone Payor performed and he did a tattooing of the distal polyp to aid in  resection.  This was done approximately five weeks ago and the patient now  presents for elective resection after undergoing outpatient bowel prep.   OPERATIVE FINDINGS:  A full laparotomy was performed.  He had a normal  feeling liver and gallbladder.  The entire small bowel felt normal.  The  large bowel felt normal except for the cecum where I could feel the mass  that Dr. Leone Payor had biopsied.  I did not see any evidence of any tattoo in  the ascending or transverse colon.  The pathologist reported that there was  a large lesion in the cecum.  He did not see any other polyps in the  ascending colon and he is going to check further.  Dr. Leone Payor did biopsy  other areas and perhaps he removed them by biopsy.   DESCRIPTION OF PROCEDURE:  Under satisfactory general endotracheal  anesthesia, having received preoperative bowel prep, mechanical and  antibiotic, heparin, __________.  His abdomen was prepped and draped in the  standard fashion.  Nasogastric and Foley catheters  were placed.   The midline incision was made and because of the man's size, it was extended  superiorly.  The laparotomy was performed with the findings mentioned above.  Self-retaining retractors were placed and the small bowel packed away.  The  peritoneal reflection taken down along the right colon starting at the  ileocecum and extending up to the hepatic flexure.  The hepatic flexure was  divided between clamps and ties of 2-0 silk.  The ureter, kidney and  duodenum were identified and not injured.  Then appropriate sites were  picked for the transection, the terminal ileum cleared and the GIA stapler  fired.  Likewise on the right portion of the transverse colon, an area was  selected and divided with the stapler.  The middle colic vessels were well  to the midline of this transection.  Then the mesentery was controlled  with  clamps and ties of 2-0 silk and the specimen sent to pathology for  inspection. Bleeders of the mesentery were oversewn with silk suture and  then an anastomosis performed.   The GIA was fired through stab wounds creating a functional end-to-end, side-  to-side anastomosis.  The anastomotic staple line was checked for hemostasis  and required two 3-0 silk sutures for hemostasis which was then good.  The  stab wounds then closed with interrupted 3-0 silk in a simple and Gambee  type.  These sutures were then placed over this, creating a two-layer  anastomosis.  At this point the gloves and pads were changed.   The anastomosis was __________ for hemostasis and patency.  It felt fine and  lay without touching.  Blood supply was good.  Then the mesentery was closed  with interrupted 2-0 silk figure-of-eight sutures.  The entire abdomen was  lavaged with saline, checked for hemostasis and then closed after the sponge  and needle counts were correct.  Running #1 PDS were approximated the  midline and staples for the skin.  Sterile absorbent dressings were then   applied and the patient went to the recovery room from the operating room in  good condition without complications.                                               Gita Kudo, M.D.    MRL/MEDQ  D:  10/27/2003  T:  10/27/2003  Job:  [id number]   cc:   Wardell Heath, M.D.   Iva Boop, M.D. [facility]   Rosalyn Gess. Norins, M.D. [facility]

## 2010-12-29 NOTE — Discharge Summary (Signed)
NAMEJAHKING, LESSER                          ACCOUNT NO.:  [id number]   MEDICAL RECORD NO.:  [id number]                   PATIENT TYPE:  INP   LOCATION:  0343                                 FACILITY:  [facility]   PHYSICIAN:  Gita Kudo, M.D.              DATE OF BIRTH:  [birth year]   DATE OF ADMISSION:  10/27/2003  DATE OF DISCHARGE:  11/01/2003                                 DISCHARGE SUMMARY   CHIEF COMPLAINT:  Colon lesion.   HISTORY OF PRESENT ILLNESS:  A 77 year old male brought in for elective  colon resection.  He had routine colonoscopy showing multiple lesions in the  right colon with a very large cecal lesion.  Dr. Leone Payor biopsied this and  felt he could not remove it safely through the scope, and excision was  recommended.  Tattooing of the distal polyp in the right colon was also  performed at that time.   The patient's past medical history is fairly negative.  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  He takes  several medications for his hypertension.   LABORATORY STUDIES:  Pathology right colon, terminal ileum.  Invasive  adenocarcinoma NATVA.  Eighteen negative mesenteric nodes.  The polyp was  4.0 cm in size, and the adenoma had only a few microscopic foci of invasive  disease.  His CBC, chest x-ray, and EKG were within normal limits.   HOSPITAL COURSE:  On the morning of admission, the patient underwent a right  colectomy.  Postoperatively, he did quite well.  He had some difficulty  voiding, but this eventually straightened out; catheter was withdrawn.  He  tolerated a diet, was having bowel function.  Accordingly, by his fifth  postoperative day, he was discharged.  His pressures had crept up in the  170/180 range, and he was maintained on his antihypertensives.  He was  discharged on a regular diet, limited activity, Vicodin for pain.  He was to  be seen in the office in 2-3 weeks.   Of note, his CEA level was  normal at 2.0.   DISCHARGE DIAGNOSIS:  Tubovillous adenoma with invasive carcinoma (foci).   OPERATIONS:  Right hemicolectomy, October 27, 2003.   COMPLICATIONS:  Infectious consultations - none.   CONDITION AT DISCHARGE:  Good.                                               Gita Kudo, M.D.    MRL/MEDQ  D:  11/09/2003  T:  11/09/2003  Job:  [id number]   cc:   Wardell Heath, MD   Iva Boop, M.D. [facility]   Rosalyn Gess. Norins, M.D. [facility]

## 2011-02-01 ENCOUNTER — Encounter: Payer: Self-pay | Admitting: Internal Medicine

## 2011-02-02 ENCOUNTER — Ambulatory Visit (INDEPENDENT_AMBULATORY_CARE_PROVIDER_SITE_OTHER): Payer: Medicare Other | Admitting: Internal Medicine

## 2011-02-02 ENCOUNTER — Encounter: Payer: Self-pay | Admitting: Internal Medicine

## 2011-02-02 VITALS — BP 136/70 | HR 100 | Temp 98.8°F | Resp 16 | Wt 196.5 lb

## 2011-02-02 DIAGNOSIS — Z029 Encounter for administrative examinations, unspecified: Secondary | ICD-10-CM

## 2011-02-02 NOTE — Progress Notes (Signed)
  Subjective:    Patient ID: [name], male    DOB: 1933/07/01, 77 y.o.   MRN: [id number]  HPI [name] presents for follow-up. In the interval since his last visit he has TKR right. He had a lot of pain but is doing better and able to ambulate with a cane. He plans to schedule a full exam in order to completely bring me up to date on his medical problems.  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   PMH, FamHx and SocHx reviewed for any changes and relevance.   Review of Systems deferred    Objective:   Physical Exam Vitals noted WNWD AA male in no acute distress Ext - TKR right knee - well healed scar       Assessment & Plan:  Admin visit - letter done re: chronic constipation dating to service and exacerbated over the years by chronic pain meds.

## 2011-02-12 ENCOUNTER — Ambulatory Visit (INDEPENDENT_AMBULATORY_CARE_PROVIDER_SITE_OTHER): Payer: Medicare Other | Admitting: Internal Medicine

## 2011-02-12 ENCOUNTER — Encounter: Payer: Self-pay | Admitting: Internal Medicine

## 2011-02-12 DIAGNOSIS — M1990 Unspecified osteoarthritis, unspecified site: Secondary | ICD-10-CM

## 2011-02-12 DIAGNOSIS — I10 Essential (primary) hypertension: Secondary | ICD-10-CM

## 2011-02-12 DIAGNOSIS — G609 Hereditary and idiopathic neuropathy, unspecified: Secondary | ICD-10-CM

## 2011-02-12 MED ORDER — IBANDRONATE SODIUM 150 MG PO TABS: 150.0000 mg | ORAL_TABLET | ORAL | Status: DC

## 2011-02-12 NOTE — Progress Notes (Signed)
Subjective:    Patient ID: [name], male    DOB: [birth year], 77 y.o.   MRN: [id number]  HPI [name] presents for a Dept of Labor evaluation in regard to orthopedic and neuropathic issues. He has had a right TKR recently. His overall health has been good. He does complain for leg pain and weakness.   Past Medical History  Diagnosis Date  . History of colon cancer   . Hypertension   . Osteoporosis   . Peripheral neuropathy   . Allergic rhinitis   . BPH (benign prostatic hypertrophy)   . Posttraumatic stress disorder   . DJD (degenerative joint disease)   . Dyspepsia   . DM2 (diabetes mellitus, type 2)   . Hearing loss     bilateral  . Cataract     bilateral extrraction and lens implants '88-,'89   Past Surgical History  Procedure Date  . Colon surgery     for colon cancer march 2005  . Arthroscopy, right knee   . Total knee arthroplasty     marthc '12, right  . Inner ear surgery     excision of tumor, then two surgeries: prosthesis, then had to remove.  '10   Family History  Problem Relation Age of Onset  . Cancer Mother   . Cancer Father   . Cancer Sister     pancreatic  . Cancer Brother     prostate  . Cancer Sister     Breast cancer  . Cancer Sister     Breast cancer   History   Social History  . Marital Status: Married    Spouse Name: N/A    Number of Children: N/A  . Years of Education: N/A   Occupational History  . Not on file.   Social History Main Topics  . Smoking status: Former Games developer  . Smokeless tobacco: Not on file  . Alcohol Use: Not on file  . Drug Use: Not on file  . Sexually Active: Not on file   Other Topics Concern  . 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  HEENT:  Negative for hearing loss, ear pain, congestion, neck stiffness and postnasal drip. Negative for sore throat or swallowing problems. Negative for dental complaints.   Eyes: Negative for vision loss or change in visual acuity.  Respiratory: Negative for chest tightness and wheezing.   Cardiovascular: Negative for chest pain and palpitation. No decreased exercise tolerance Gastrointestinal: No change in bowel habit. No bloating or gas. No reflux or indigestion Genitourinary: Negative for urgency, frequency, flank pain and difficulty urinating.  Musculoskeletal: Negative for myalgias, back pain, arthralgias and gait problem.  Neurological: Negative for dizziness, tremors, weakness and headaches.  Hematological: Negative for adenopathy.  Psychiatric/Behavioral: Negative for behavioral problems and dysphoric mood.       Objective:   Physical Exam Vitals signs reviewed Gen'l - pleasant, slender AA man who looks younger than his 67 years. HEENT - atraumatic Pulmonary - no increased work of breathing, no rales or wheezes. Cor - RRR MSK/Neuro - Back exam: normal stand; normal flex to greater than 100 degrees; normal gait; normal toe/heel walk; normal step up to exam table; normal SLR sitting; normal DTRs at the patellar  tendon left, could not elicit right; normal sensation to light touch, pin-prick but decreased deep vibratory sensation both feet; no  CVA tenderness; able to move supine to sitting witout assistance. Walked in to the visit without assist: no cane or scooter.         Assessment & Plan:

## 2011-02-13 NOTE — Assessment & Plan Note (Signed)
BP Readings from Last 3 Encounters:  02/12/11 114/78  02/02/11 136/70  07/13/10 146/88   Good control on present regimen

## 2011-02-13 NOTE — Assessment & Plan Note (Signed)
Persistent symptoms and decreased sensation to deep vibration on exam that is consistent with diagnosis with no change from previous exams.

## 2011-02-13 NOTE — Assessment & Plan Note (Signed)
Exam as noted with improved gait s/p right TKR. No focal abnormality on exam. However, exam is limited and does not reflect full functional testing in regard to endurance or strength testing, i.e. Use of dynometer, etc, which is beyond capability in this office ([facility] office)

## 2011-02-20 ENCOUNTER — Telehealth: Payer: Self-pay | Admitting: *Deleted

## 2011-02-20 MED ORDER — METRONIDAZOLE 500 MG PO TABS: ORAL_TABLET | ORAL | Status: DC

## 2011-02-20 NOTE — Telephone Encounter (Signed)
Per MD needs flagyl - Wife has recurrent BV

## 2011-02-21 ENCOUNTER — Other Ambulatory Visit: Payer: Self-pay | Admitting: *Deleted

## 2011-02-21 MED ORDER — METRONIDAZOLE 500 MG PO TABS: ORAL_TABLET | ORAL | Status: AC

## 2011-05-04 ENCOUNTER — Other Ambulatory Visit: Payer: Self-pay | Admitting: Internal Medicine

## 2011-05-15 LAB — CBC
HCT: 42.9
Hemoglobin: 14.2
MCHC: 33
RDW: 14.2

## 2011-05-15 LAB — COMPREHENSIVE METABOLIC PANEL
Albumin: 3.4 — ABNORMAL LOW
BUN: 22
Chloride: 99
Creatinine, Ser: 1.62 — ABNORMAL HIGH
GFR calc non Af Amer: 42 — ABNORMAL LOW
Glucose, Bld: 101 — ABNORMAL HIGH
Total Bilirubin: 2.4 — ABNORMAL HIGH

## 2011-05-15 LAB — URINALYSIS, ROUTINE W REFLEX MICROSCOPIC
Glucose, UA: NEGATIVE
Ketones, ur: 15 — AB
Urobilinogen, UA: 1
pH: 5.5

## 2011-05-15 LAB — URINE MICROSCOPIC-ADD ON

## 2011-05-15 LAB — DIFFERENTIAL
Basophils Absolute: 0
Eosinophils Relative: 0
Lymphocytes Relative: 1 — ABNORMAL LOW
Monocytes Absolute: 0.6

## 2011-05-15 LAB — LIPASE, BLOOD: Lipase: 10 — ABNORMAL LOW

## 2011-06-22 ENCOUNTER — Encounter: Payer: Self-pay | Admitting: Internal Medicine

## 2011-06-22 ENCOUNTER — Ambulatory Visit (INDEPENDENT_AMBULATORY_CARE_PROVIDER_SITE_OTHER): Payer: Medicare Other | Admitting: Internal Medicine

## 2011-06-22 DIAGNOSIS — Z0279 Encounter for issue of other medical certificate: Secondary | ICD-10-CM

## 2011-06-22 DIAGNOSIS — N281 Cyst of kidney, acquired: Secondary | ICD-10-CM

## 2011-06-22 NOTE — Progress Notes (Signed)
  Subjective:    Patient ID: [name], male    DOB: 06-10-1933, 77 y.o.   MRN: [id number]  HPI He presents to review labs in regard to renal function and to have a letter in regard to his service related injuries.   Lab - review Cr 1.33 ( nl up to 1.27) with eGFR 63 - normal.  I have reviewed the patient's medical history in detail and updated the computerized patient record.    Review of Systems No constitutional, cardio-pulmonary, GI problems.    Objective:   Physical Exam Vitals noted Gen'l - WNWD AA mn in no distress PUlm - normal respirations MSK - ambulating well in the office.       Assessment & Plan:  Administrative - provided letter in regard to his medical status and how it may relate to service related problem.

## 2011-06-24 NOTE — Assessment & Plan Note (Signed)
Reviewed labs and outside labs: patient reassured that he has normal renal function.

## 2011-08-27 ENCOUNTER — Ambulatory Visit (INDEPENDENT_AMBULATORY_CARE_PROVIDER_SITE_OTHER): Payer: Medicare Other | Admitting: Internal Medicine

## 2011-08-27 VITALS — BP 132/84 | HR 81 | Temp 98.0°F | Wt 212.0 lb

## 2011-08-27 DIAGNOSIS — Z136 Encounter for screening for cardiovascular disorders: Secondary | ICD-10-CM

## 2011-08-27 DIAGNOSIS — R0789 Other chest pain: Secondary | ICD-10-CM | POA: Insufficient documentation

## 2011-08-27 NOTE — Assessment & Plan Note (Signed)
Patient with atypical chest pain. Risk factors include gender, age, hypertension, diabetes. EKG today is normal and exam is normal.  Plan - obtain stress study results from [facility] ASA 325 mg daily           Refer to cardiology for further risk stratification           For increase severity, frequency or duration of chest pain, especially if accompanied by SOB, diaphoresis - to [facility] ED

## 2011-08-27 NOTE — Patient Instructions (Signed)
Exertional chest pain - normal physical exam. Normal EKG. This is a problem that is worth working up. Will obtain stress test from [place]. Will arrange for an appointment with [facility] Cardiology. Please start taking aspirin 325 mg once a day. For worsening chest pain, shortness of breath, sweating please go to [facility] ED and ask to be seen by [facility] Cardiology.

## 2011-08-27 NOTE — Progress Notes (Signed)
  Subjective:    Patient ID: [name], male    DOB: 06/27/1933, 78 y.o.   MRN: [id number]  HPI Mr. Alberta is added to the schedule for urgent evaluation of chest pain. He reports a 3 week h/o chest pain that is exertional, has associated SOB and has had one or two episodes of diaphoresis. The intensity of the pain has not changed. The frequency has not changed. He reports having a nuclear stress test at the [place] about 1 year ago to be cleared for knee surgery. He was having similar pains at that time.   Past Medical History  Diagnosis Date  . History of colon cancer   . Hypertension   . Osteoporosis   . Peripheral neuropathy   . Allergic rhinitis   . BPH (benign prostatic hypertrophy)   . Posttraumatic stress disorder   . DJD (degenerative joint disease)   . Dyspepsia   . DM2 (diabetes mellitus, type 2)   . Hearing loss     bilateral  . Cataract     bilateral extrraction and lens implants '88-,'89   Past Surgical History  Procedure Date  . Colon surgery     for colon cancer march 2005  . Arthroscopy, right knee   . Total knee arthroplasty     marthc '12, right  . Inner ear surgery     excision of tumor, then two surgeries: prosthesis, then had to remove.  '10   Family History  Problem Relation Age of Onset  . Cancer Mother   . Cancer Father   . Cancer Sister     pancreatic  . Cancer Brother     prostate  . Cancer Sister     Breast cancer  . Cancer Sister     Breast cancer   History   Social History  . Marital Status: Married    Spouse Name: N/A    Number of Children: N/A  . Years of Education: N/A   Occupational History  . Not on file.   Social History Main Topics  . Smoking status: Former Games developer  . Smokeless tobacco: Not on file  . Alcohol Use: Not on file  . Drug Use: Not on file  . Sexually Active: Not on file   Other Topics Concern  . Not on file   Social History Narrative   HSG.  Married '61- '73, divorced; married '79.  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  1 adopted son- from [place], 2 step-sons. Disability with multiple military related problems. Did work for the [employer] until 1993.        Review of Systems System review is negative for any constitutional, cardiac, pulmonary, GI or neuro symptoms or complaints other than as described in the HPI.     Objective:   Physical Exam Filed Vitals:   08/27/11 1309  BP: 132/84  Pulse: 81  Temp: 98 F (36.7 C)   Gen'l- WNWD AA male in no distress Chest- CTAP Cor - 2+ radial pulse, RRR, normal heart sounds Neuro A&O x 3, ambulates w/o assistance      Assessment & Plan:

## 2011-09-05 ENCOUNTER — Telehealth: Payer: Self-pay | Admitting: Internal Medicine

## 2011-09-05 NOTE — Telephone Encounter (Signed)
[facility]10. Forwarded  2pages to Dr. Reva Bores review.

## 2011-09-12 ENCOUNTER — Encounter: Payer: Self-pay | Admitting: Cardiovascular Disease

## 2011-09-12 ENCOUNTER — Encounter (INDEPENDENT_AMBULATORY_CARE_PROVIDER_SITE_OTHER): Payer: Medicare Other

## 2011-09-12 ENCOUNTER — Ambulatory Visit (INDEPENDENT_AMBULATORY_CARE_PROVIDER_SITE_OTHER): Payer: Medicare Other | Admitting: Cardiovascular Disease

## 2011-09-12 DIAGNOSIS — R55 Syncope and collapse: Secondary | ICD-10-CM

## 2011-09-12 DIAGNOSIS — R072 Precordial pain: Secondary | ICD-10-CM

## 2011-09-12 DIAGNOSIS — R0789 Other chest pain: Secondary | ICD-10-CM

## 2011-09-12 NOTE — Patient Instructions (Signed)
Your physician recommends that you schedule a follow-up appointment in: 6 weeks.   Your physician has recommended that you wear an event monitor. Event monitors are medical devices that record the heart's electrical activity. Doctors most often [place] these monitors to diagnose arrhythmias. Arrhythmias are problems with the speed or rhythm of the heartbeat. The monitor is a small, portable device. You can wear one while you do your normal daily activities. This is usually used to diagnose what is causing palpitations/syncope (passing out).   Your physician has requested that you have a dobutamine echocardiogram. For further information please visit [email or web address] Please follow instruction sheet as given.

## 2011-09-14 ENCOUNTER — Encounter: Payer: Self-pay | Admitting: Internal Medicine

## 2011-09-17 ENCOUNTER — Other Ambulatory Visit (HOSPITAL_COMMUNITY): Payer: Medicare Other | Admitting: Radiology

## 2011-09-17 DIAGNOSIS — I472 Ventricular tachycardia: Secondary | ICD-10-CM | POA: Insufficient documentation

## 2011-09-17 NOTE — Progress Notes (Signed)
HPI:  This 78 year old gentleman presenting for cardiac evaluation. The patient really is presenting for a second opinion as he has undergone previous cardiac evaluation through the [facility]. He has multiple complaints including palpitations, chest pain, lightheadedness, and syncope. He has episodes of "heart racing."  The patient has had multiple episodes of lightheadedness which precipitated spell where he falls to the ground. He denies frank loss of consciousness. He has never undergone outpatient telemetry by his report.  The patient also complains of chest pain that feels like a pressure across the front of his chest. This is not related to exertion or emotional stress. It is unpredictable and typically has been self-limited. He denies significant shortness of breath. He has frequent lightheadedness which mainly occurs when he goes from lying to sitting or from sitting to standing.  The patient has undergone some previous cardiac evaluation with stress testing which was performed before knee surgery in 2011. His nuclear stress scan showed no significant ischemia. Left ventricular function has been normal in the past(by Myoview was 63%).  Outpatient Encounter Prescriptions as of 09/12/2011  Medication Sig Dispense Refill  . allopurinol (ZYLOPRIM) 300 MG tablet Take 300 mg by mouth daily. For Gout.       . bisacodyl (DULCOLAX) 5 MG EC tablet Take 10 mg by mouth 2 (two) times daily as needed.        Marland Kitchen BONIVA 150 MG tablet TAKE AS DIRECTED  3 each  4  . Calcium Carbonate-Vitamin D (CALCIUM 500 + D PO) Take by mouth.        . cetirizine (ZYRTEC) 10 MG tablet Take 10 mg by mouth as needed.      . ciprofloxacin-dexamethasone (CIPRODEX) otic suspension Place 4 drops into the right ear as needed.      . clonazePAM (KLONOPIN) 0.5 MG tablet Take 0.5 mg by mouth every 6 (six) hours as needed.        . clotrimazole (LOTRIMIN) 1 % cream Apply 1 application topically 2 (two) times daily. To toenails and  bottom of feet       . cyanocobalamin 1000 MCG tablet Take 100 mcg by mouth daily.        . folic acid (FOLVITE) 1 MG tablet Take 1 mg by mouth daily.        Marland Kitchen gabapentin (NEURONTIN) 300 MG capsule Take 300 mg by mouth 3 (three) times daily as needed. Foot pain.       Marland Kitchen glucose blood test strip 1 each by Other route as needed. Precision Xtra Blood Glucose Strip. Use as instructed       . HYDROcodone-acetaminophen (NORCO) 5-325 MG per tablet Take 1 tablet by mouth every 6 (six) hours as needed.      . indomethacin (INDOCIN) 50 MG capsule Take 50 mg by mouth every 6 (six) hours as needed.        Marland Kitchen lisinopril-hydrochlorothiazide (PRINZIDE,ZESTORETIC) 20-25 MG per tablet Take 1 tablet by mouth daily.        . meclizine (ANTIVERT) 12.5 MG tablet Take 25 mg by mouth 3 (three) times daily as needed. Dizziness.      . meloxicam (MOBIC) 15 MG tablet Take 15 mg by mouth daily.      . metFORMIN (GLUCOPHAGE) 500 MG tablet Take 500 mg by mouth 2 (two) times daily with a meal.        . multivitamin-lutein (OCUVITE-LUTEIN) CAPS Take 1 capsule by mouth as needed.      . naproxen (NAPROSYN)  250 MG tablet Take 250 mg by mouth 2 (two) times daily with a meal.      . ofloxacin (FLOXIN) 0.3 % otic solution Place 10 drops into the right ear 2 (two) times daily.        Marland Kitchen omeprazole (PRILOSEC) 20 MG capsule Take 20 mg by mouth daily.        . phosphate (FLEET) enema Place 1 enema rectally once as needed.        . pyridoxine (B-6) 50 MG tablet Take 50 mg by mouth daily.        . salsalate (DISALCID) 750 MG tablet Take 750 mg by mouth 2 (two) times daily.        . sildenafil (VIAGRA) 100 MG tablet Take 100 mg by mouth daily as needed.        . vardenafil (LEVITRA) 20 MG tablet Take 10 mg by mouth daily as needed.        . venlafaxine (EFFEXOR-XR) 75 MG 24 hr capsule Take 75 mg by mouth 2 (two) times daily.        . chlorpheniramine (CHLOR-TRIMETON) 4 MG tablet Take 4 mg by mouth daily.        Marland Kitchen DISCONTD: bisacodyl  (DULCOLAX) 10 MG suppository Place 10 mg rectally as needed.        Marland Kitchen DISCONTD: hydrochlorothiazide 25 MG tablet Take 25 mg by mouth daily.        Marland Kitchen DISCONTD: lisinopril (PRINIVIL,ZESTRIL) 40 MG tablet Take 40 mg by mouth daily.        Marland Kitchen DISCONTD: Omega-3 Fatty Acids (FISH OIL) 1000 MG CAPS Take by mouth.        . DISCONTD: venlafaxine (EFFEXOR) 37.5 MG tablet Take 37.5 mg by mouth 2 (two) times daily.          Review of patient's allergies indicates no known allergies.  Past Medical History  Diagnosis Date  . History of colon cancer   . Hypertension   . Osteoporosis   . Peripheral neuropathy   . Allergic rhinitis   . BPH (benign prostatic hypertrophy)   . Posttraumatic stress disorder   . DJD (degenerative joint disease)   . Dyspepsia   . DM2 (diabetes mellitus, type 2)   . Hearing loss     bilateral  . Cataract     bilateral extrraction and lens implants '88-,'89    Past Surgical History  Procedure Date  . Colon surgery     for colon cancer march 2005  . Arthroscopy, right knee   . Total knee arthroplasty     marthc '12, right  . Inner ear surgery     excision of tumor, then two surgeries: prosthesis, then had to remove.  '10    History   Social History  . Marital Status: Married    Spouse Name: N/A    Number of Children: N/A  . Years of Education: N/A   Occupational History  . Not on file.   Social History Main Topics  . Smoking status: Former Games developer  . Smokeless tobacco: Not on file  . Alcohol Use: Not on file  . Drug Use: Not on file  . Sexually Active: Not on file   Other Topics Concern  . Not on file   Social History Narrative   HSG.  Married '61- '73, divorced; married '79.  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  1 adopted son- from [place], 2 step-sons. Disability with multiple military related problems. Did work for the [employer] until  5.     Family History  Problem Relation Age of Onset  . Cancer Mother   . Cancer Father   . Cancer Sister      pancreatic  . Cancer Brother     prostate  . Cancer Sister     Breast cancer  . Cancer Sister     Breast cancer    ROS: General: no fevers/chills/night sweats Eyes: no blurry vision, diplopia, or amaurosis ENT: no sore throat or hearing loss Resp: no cough, wheezing, or hemoptysis CV: no edema or palpitations GI: no abdominal pain, nausea, vomiting, diarrhea, or constipation GU: no dysuria, frequency, or hematuria Skin: no rash Neuro: no headache, numbness, tingling, or weakness of extremities Musculoskeletal: no joint pain or swelling Heme: no bleeding, DVT, or easy bruising Endo: no polydipsia or polyuria  BP 142/80  Pulse 80  Ht 6\' 2"  (1.88 m)  Wt 95.255 kg (210 lb)  BMI 26.96 kg/m2  SpO2 96%  PHYSICAL EXAM: Pt is alert and oriented, WD, WN, in no distress. HEENT: normal Neck: JVP normal. Carotid upstrokes normal without bruits. No thyromegaly. Lungs: equal expansion, clear bilaterally CV: Apex is discrete and nondisplaced, RRR without murmur or gallop Abd: soft, NT, +BS, no bruit, no hepatosplenomegaly Back: no CVA tenderness Ext: no C/C/E        Femoral pulses 2+= without bruits        DP/PT pulses intact and = Skin: warm and dry without rash Neuro: CNII-XII intact             Strength intact = bilaterally  EKG:  August 27, 2011: Normal sinus rhythm, within normal limits.  ASSESSMENT AND PLAN:

## 2011-09-17 NOTE — Assessment & Plan Note (Signed)
The patient has frequent episodes of lightheadedness and near-syncope with collapse. There is no clear explanation for this. He does not have conduction disease that I appreciate on his 12-lead EKG. However, I think we should rule out significant arrhythmia with an event recorder.

## 2011-09-17 NOTE — Assessment & Plan Note (Signed)
The patient has chest pain with typical and atypical features. He had a stress test about 18 months ago. I have recommended a repeat dobutamine echocardiogram to rule out significant ischemia.

## 2011-09-20 ENCOUNTER — Ambulatory Visit (HOSPITAL_COMMUNITY): Payer: Medicare Other | Attending: Cardiovascular Disease | Admitting: Radiology

## 2011-09-20 ENCOUNTER — Other Ambulatory Visit (HOSPITAL_COMMUNITY): Payer: Self-pay | Admitting: Cardiovascular Disease

## 2011-09-20 DIAGNOSIS — E119 Type 2 diabetes mellitus without complications: Secondary | ICD-10-CM | POA: Insufficient documentation

## 2011-09-20 DIAGNOSIS — R55 Syncope and collapse: Secondary | ICD-10-CM | POA: Insufficient documentation

## 2011-09-20 DIAGNOSIS — R0989 Other specified symptoms and signs involving the circulatory and respiratory systems: Secondary | ICD-10-CM

## 2011-09-20 DIAGNOSIS — Z87891 Personal history of nicotine dependence: Secondary | ICD-10-CM | POA: Insufficient documentation

## 2011-09-20 DIAGNOSIS — R079 Chest pain, unspecified: Secondary | ICD-10-CM | POA: Insufficient documentation

## 2011-09-20 DIAGNOSIS — R072 Precordial pain: Secondary | ICD-10-CM

## 2011-09-20 MED ORDER — ATROPINE SULFATE 0.1 MG/ML IJ SOLN
0.2500 mg | Freq: Once | INTRAMUSCULAR | Status: AC
  Administered 2011-09-20: 0.25 mg via INTRAVENOUS

## 2011-09-20 MED ORDER — SODIUM CHLORIDE 0.9 % IV SOLN
40.0000 ug/kg | Freq: Once | INTRAVENOUS | Status: AC
  Administered 2011-09-20: 40 ug/kg/min via INTRAVENOUS

## 2011-11-01 ENCOUNTER — Ambulatory Visit (INDEPENDENT_AMBULATORY_CARE_PROVIDER_SITE_OTHER): Payer: Medicare Other | Admitting: Internal Medicine

## 2011-11-01 ENCOUNTER — Encounter: Payer: Self-pay | Admitting: Internal Medicine

## 2011-11-01 ENCOUNTER — Ambulatory Visit: Payer: Medicare Other | Admitting: Cardiovascular Disease

## 2011-11-01 VITALS — BP 124/80 | HR 76 | Wt 212.4 lb

## 2011-11-01 DIAGNOSIS — I472 Ventricular tachycardia, unspecified: Secondary | ICD-10-CM

## 2011-11-01 DIAGNOSIS — I4729 Other ventricular tachycardia: Secondary | ICD-10-CM

## 2011-11-01 DIAGNOSIS — R0789 Other chest pain: Secondary | ICD-10-CM

## 2011-11-01 DIAGNOSIS — I10 Essential (primary) hypertension: Secondary | ICD-10-CM

## 2011-11-01 MED ORDER — METOPROLOL SUCCINATE ER 25 MG PO TB24: 25.0000 mg | ORAL_TABLET | Freq: Every day | ORAL | Status: AC

## 2011-11-01 NOTE — Progress Notes (Signed)
Primary Care Physician: Illene Regulus, MD, MD Referring Physician:  Dr Vance Peper is a 78 y.o. male with a h/o preserved ejection fraction and HTN/ DM who presents today for EP consultation regarding nonsustained ventricular tachycardia recently observed on telemetry.  He has been evaluated by Dr Excell Seltzer previously as a second opinion for palpitations and atypical chest pain.  He has undergone previous cardiac evaluation through [facility] by Dr Runell Gess. Today, he is a difficult historian with multiple chronic medical concerns including diabetes, arthritis, neuropathy, and hypertension.  He attributes each of these to "agent orange".  In addition, he states that he is medically disabled for PTSD and follows primarily at the [place]. Presently he does not appear to have cardiac concerns.  He reports that he is occasional "weak" but denies associated chest pains or syncope.  He attributes these more to neuropathy.  He has occasional palpitations which he states are worse when he is "upset".  Presently, he feels that his chest pain has improved.  He has previously undergone normal stress echo here which was normal.  The patient has also undergone previous cardiac evaluation with stress testing which was performed before knee surgery in 2011. His nuclear stress scan showed no significant ischemia. Left ventricular function has been normal in the past(by Myoview was 63%). He recently wore an event monitor.  This documented predominantly sinus rhythm.  He had occasional PVCs not closely coupled to the T wave as well as episodes of NSVT.  Today, he denies symptoms of  chest pain, shortness of breath, orthopnea, PND, lower extremity edema, dizziness, presyncope, or syncope. The patient is tolerating medications without difficulties and is otherwise without complaint today.   Past Medical History  Diagnosis Date  . History of colon cancer   . Hypertension   . Osteoporosis   . Peripheral  neuropathy   . Allergic rhinitis   . BPH (benign prostatic hypertrophy)   . Posttraumatic stress disorder   . DJD (degenerative joint disease)   . Dyspepsia   . DM2 (diabetes mellitus, type 2)   . Hearing loss     bilateral  . Cataract     bilateral extrraction and lens implants '88-,'89   Past Surgical History  Procedure Date  . Colon surgery     for colon cancer march 2005  . Arthroscopy, right knee   . Total knee arthroplasty     marthc '12, right  . Inner ear surgery     excision of tumor, then two surgeries: prosthesis, then had to remove.  '10    Current Outpatient Prescriptions  Medication Sig Dispense Refill  . allopurinol (ZYLOPRIM) 300 MG tablet Take 300 mg by mouth daily. For Gout.       . bisacodyl (DULCOLAX) 5 MG EC tablet Take 10 mg by mouth 2 (two) times daily as needed.        Marland Kitchen BONIVA 150 MG tablet TAKE AS DIRECTED  3 each  4  . Calcium Carbonate-Vitamin D (CALCIUM 500 + D PO) Take by mouth.        . cetirizine (ZYRTEC) 10 MG tablet Take 10 mg by mouth as needed.      . chlorpheniramine (CHLOR-TRIMETON) 4 MG tablet Take 4 mg by mouth daily.        . ciprofloxacin-dexamethasone (CIPRODEX) otic suspension Place 4 drops into the right ear as needed.      . clonazePAM (KLONOPIN) 0.5 MG tablet Take 0.5 mg by mouth  every 6 (six) hours as needed.        . clotrimazole (LOTRIMIN) 1 % cream Apply 1 application topically 2 (two) times daily. To toenails and bottom of feet       . cyanocobalamin 1000 MCG tablet Take 100 mcg by mouth daily.        . folic acid (FOLVITE) 1 MG tablet Take 1 mg by mouth daily.        Marland Kitchen gabapentin (NEURONTIN) 300 MG capsule Take 300 mg by mouth 3 (three) times daily as needed. Foot pain.       Marland Kitchen glucose blood test strip 1 each by Other route as needed. Precision Xtra Blood Glucose Strip. Use as instructed       . HYDROcodone-acetaminophen (NORCO) 5-325 MG per tablet Take 1 tablet by mouth every 6 (six) hours as needed.      . indomethacin  (INDOCIN) 50 MG capsule Take 50 mg by mouth every 6 (six) hours as needed.        Marland Kitchen lisinopril-hydrochlorothiazide (PRINZIDE,ZESTORETIC) 20-25 MG per tablet Take 1 tablet by mouth daily.        . meclizine (ANTIVERT) 12.5 MG tablet Take 25 mg by mouth 3 (three) times daily as needed. Dizziness.      . meloxicam (MOBIC) 15 MG tablet Take 15 mg by mouth daily.      . metFORMIN (GLUCOPHAGE) 500 MG tablet Take 500 mg by mouth 2 (two) times daily with a meal.        . multivitamin-lutein (OCUVITE-LUTEIN) CAPS Take 1 capsule by mouth as needed.      . naproxen (NAPROSYN) 250 MG tablet Take 250 mg by mouth 2 (two) times daily with a meal.      . ofloxacin (FLOXIN) 0.3 % otic solution Place 10 drops into the right ear 2 (two) times daily.        Marland Kitchen omeprazole (PRILOSEC) 20 MG capsule Take 20 mg by mouth daily.        . phosphate (FLEET) enema Place 1 enema rectally once as needed.        . pyridoxine (B-6) 50 MG tablet Take 50 mg by mouth daily.        . salsalate (DISALCID) 750 MG tablet Take 750 mg by mouth 2 (two) times daily.        . sildenafil (VIAGRA) 100 MG tablet Take 100 mg by mouth daily as needed.        . vardenafil (LEVITRA) 20 MG tablet Take 10 mg by mouth daily as needed.        . venlafaxine (EFFEXOR-XR) 75 MG 24 hr capsule Take 75 mg by mouth 2 (two) times daily.          No Known Allergies  History   Social History  . Marital Status: Married    Spouse Name: N/A    Number of Children: N/A  . Years of Education: N/A   Occupational History  . Not on file.   Social History Main Topics  . Smoking status: Former Games developer  . Smokeless tobacco: Not on file  . Alcohol Use: Yes     occasional  . Drug Use: No  . Sexually Active: Not on file   Other Topics Concern  . Not on file   Social History Narrative   HSG.  Married '61- '73, divorced; married '79.  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  1 adopted son- from [place], 2 step-sons. Disability with multiple military related problems.  Did  work for the [employer] until 1993. Lives in [place].    Family History  Problem Relation Age of Onset  . Cancer Mother   . Cancer Father   . Cancer Sister     pancreatic  . Cancer Brother     prostate  . Cancer Sister     Breast cancer  . Cancer Sister     Breast cancer    ROS- All systems are reviewed and negative except as per the HPI above  Physical Exam: Filed Vitals:   11/01/11 1219  BP: 124/80  Pulse: 76  Weight: 212 lb 6.4 oz (96.344 kg)    GEN- The patient is anxious appearing,  At times, he appears to have almost pressured speech when talking about PTSD and agent orange exposure, he requires redirection and is a difficult historian,  alert and oriented x 3 today.   Head- normocephalic, atraumatic Eyes-  Sclera clear, conjunctiva pink Ears- hearing intact Oropharynx- clear Neck- supple, no JVP Lymph- no cervical lymphadenopathy Lungs- Clear to ausculation bilaterally, normal work of breathing Heart- Regular rate and rhythm, no murmurs, rubs or gallops, PMI not laterally displaced GI- soft, NT, ND, + BS Extremities- no clubbing, cyanosis, or edema MS- no significant deformity or atrophy Skin- no rash or lesion Psych- as above Neuro- strength and sensation are intact  EKG today reveals likely sinus rhythm 76 bpm though the P wave axis is atypical of sinus, PR 160, QRS 70,   Assessment and Plan:

## 2011-11-01 NOTE — Patient Instructions (Signed)
Your physician recommends that you schedule a follow-up appointment in: 3 months with Dr Johney Frame  Your physician has recommended you make the following change in your medication:  1) Start Toprol XL 25mg  daily

## 2011-11-04 NOTE — Assessment & Plan Note (Signed)
Add toprol as above

## 2011-11-04 NOTE — Assessment & Plan Note (Signed)
Recent stress echo was normal I have discussed with Dr Excell Seltzer.  At this time, we do not plan any further testing.

## 2011-11-04 NOTE — Assessment & Plan Note (Addendum)
The patient has occasional palpitations but is mostly asymptomatic with NSVT.  He has not had syncope.  His EF is normal and recent stress testing uneventful.  He denies family history of sudden death or arrhythmias. At this time, I would recommend inititiation of beta blocker therapy.  I will start Toprol XL 25mg  daily which can be uptitrated. He will return in 2 months.  IF he is doing well at that time, then I will likely return his care to his doctors at the [place].

## 2012-01-23 ENCOUNTER — Ambulatory Visit: Payer: Medicare Other | Admitting: Internal Medicine

## 2012-02-06 ENCOUNTER — Encounter: Payer: Self-pay | Admitting: Internal Medicine

## 2012-04-16 ENCOUNTER — Ambulatory Visit (INDEPENDENT_AMBULATORY_CARE_PROVIDER_SITE_OTHER): Payer: Medicare Other | Admitting: Internal Medicine

## 2012-04-16 ENCOUNTER — Telehealth: Payer: Self-pay | Admitting: Internal Medicine

## 2012-04-16 ENCOUNTER — Encounter: Payer: Self-pay | Admitting: Internal Medicine

## 2012-04-16 VITALS — BP 132/86 | HR 70 | Resp 18 | Ht 74.0 in | Wt 217.0 lb

## 2012-04-16 DIAGNOSIS — G479 Sleep disorder, unspecified: Secondary | ICD-10-CM

## 2012-04-16 DIAGNOSIS — I472 Ventricular tachycardia: Secondary | ICD-10-CM

## 2012-04-16 DIAGNOSIS — I10 Essential (primary) hypertension: Secondary | ICD-10-CM

## 2012-04-16 NOTE — Progress Notes (Signed)
PCP: Illene Regulus, MD Primary Cardiologist:  Dr [facility]  Mejia Mejia is a 78 y.o. male who presents today for routine electrophysiology followup.  Since last being seen in our clinic, the patient reports doing very well.  Today, he denies symptoms of palpitations, chest Mejia, shortness of breath,  lower extremity edema, dizziness, presyncope, or syncope.  His toprol started by me was switched to atenolol by the [place].  He is tolerating this well.  The patient is otherwise without complaint today.   Past Medical History  Diagnosis Date  . History of colon cancer   . Hypertension   . Osteoporosis   . Peripheral neuropathy   . Allergic rhinitis   . BPH (benign prostatic hypertrophy)   . Posttraumatic stress disorder   . DJD (degenerative joint disease)   . Dyspepsia   . DM2 (diabetes mellitus, type 2)   . Hearing loss     bilateral  . Cataract     bilateral extrraction and lens implants '88-,'89   Past Surgical History  Procedure Date  . Colon surgery     for colon cancer march 2005  . Arthroscopy, right knee   . Total knee arthroplasty     marthc '12, right  . Inner ear surgery     excision of tumor, then two surgeries: prosthesis, then had to remove.  '10    Current Outpatient Prescriptions  Medication Sig Dispense Refill  . allopurinol (ZYLOPRIM) 300 MG tablet Take 300 mg by mouth daily. For Gout.       . bisacodyl (DULCOLAX) 5 MG EC tablet Take 10 mg by mouth 2 (two) times daily as needed.        Marland Kitchen BONIVA 150 MG tablet TAKE AS DIRECTED  3 each  4  . Calcium Carbonate-Vitamin D (CALCIUM 500 + D PO) Take by mouth.        . cetirizine (ZYRTEC) 10 MG tablet Take 10 mg by mouth as needed.      . chlorpheniramine (CHLOR-TRIMETON) 4 MG tablet Take 4 mg by mouth daily.        . ciprofloxacin-dexamethasone (CIPRODEX) otic suspension Place 4 drops into the right ear as needed.      . clonazePAM (KLONOPIN) 0.5 MG tablet Take 0.5 mg by mouth every 6 (six) hours as  needed.        . clotrimazole (LOTRIMIN) 1 % cream Apply 1 application topically 2 (two) times daily. To toenails and bottom of feet       . cyanocobalamin 1000 MCG tablet Take 100 mcg by mouth daily.        . folic acid (FOLVITE) 1 MG tablet Take 1 mg by mouth daily.        Marland Kitchen gabapentin (NEURONTIN) 300 MG capsule Take 300 mg by mouth 3 (three) times daily as needed. Mejia Mejia.       Marland Kitchen glucose blood test strip 1 each by Other route as needed. Precision Xtra Blood Glucose Strip. Use as instructed       . HYDROcodone-acetaminophen (NORCO) 5-325 MG per tablet Take 1 tablet by mouth every 6 (six) hours as needed.      . indomethacin (INDOCIN) 50 MG capsule Take 50 mg by mouth every 6 (six) hours as needed.        Marland Kitchen lisinopril-hydrochlorothiazide (PRINZIDE,ZESTORETIC) 20-25 MG per tablet Take 1 tablet by mouth daily.        . meclizine (ANTIVERT) 12.5 MG tablet Take 25 mg by mouth  3 (three) times daily as needed. Dizziness.      . meloxicam (MOBIC) 15 MG tablet Take 15 mg by mouth daily.      . metFORMIN (GLUCOPHAGE) 500 MG tablet Take 500 mg by mouth 2 (two) times daily with a meal.        . metoprolol succinate (TOPROL XL) 25 MG 24 hr tablet Take 1 tablet (25 mg total) by mouth daily.  30 tablet  11  . multivitamin-lutein (OCUVITE-LUTEIN) CAPS Take 1 capsule by mouth as needed.      . naproxen (NAPROSYN) 250 MG tablet Take 250 mg by mouth 2 (two) times daily with a meal.      . ofloxacin (FLOXIN) 0.3 % otic solution Place 10 drops into the right ear 2 (two) times daily.        Marland Kitchen omeprazole (PRILOSEC) 20 MG capsule Take 20 mg by mouth daily.        . phosphate (FLEET) enema Place 1 enema rectally once as needed.        . pyridoxine (B-6) 50 MG tablet Take 50 mg by mouth daily.        . salsalate (DISALCID) 750 MG tablet Take 750 mg by mouth 2 (two) times daily.        . sildenafil (VIAGRA) 100 MG tablet Take 100 mg by mouth daily as needed.        . vardenafil (LEVITRA) 20 MG tablet Take 10 mg by  mouth daily as needed.        . venlafaxine (EFFEXOR-XR) 75 MG 24 hr capsule Take 75 mg by mouth 2 (two) times daily.          Physical Exam: Filed Vitals:   04/16/12 1440  BP: 132/86  Pulse: 70  Resp: 18  Height: 6\' 2"  (1.88 m)  Weight: 217 lb (98.431 kg)  SpO2: 92%    GEN- The patient is well appearing, alert and oriented x 3 today.   Head- normocephalic, atraumatic Eyes-  Sclera clear, conjunctiva pink Ears- hearing intact Oropharynx- clear Lungs- Clear to ausculation bilaterally, normal work of breathing Heart- Regular rate and rhythm, no murmurs, rubs or gallops, PMI not laterally displaced GI- soft, NT, ND, + BS Extremities- no clubbing, cyanosis, or edema   Assessment and Plan:

## 2012-04-16 NOTE — Patient Instructions (Signed)
Follow up as needed

## 2012-04-16 NOTE — Telephone Encounter (Signed)
pcc notified 

## 2012-04-16 NOTE — Assessment & Plan Note (Signed)
Asymptomatic Improved with atenolol No further workup planned  He wishes to follow-up with cardiology through the [facility] and see me as needed

## 2012-04-16 NOTE — Assessment & Plan Note (Signed)
Stable No change required today  

## 2012-04-16 NOTE — Telephone Encounter (Signed)
Pt needs referral [facility] per [facility]. Pt needs appt with ofc that will accept his [facility]

## 2012-04-21 ENCOUNTER — Ambulatory Visit (INDEPENDENT_AMBULATORY_CARE_PROVIDER_SITE_OTHER): Payer: Medicare Other | Admitting: Internal Medicine

## 2012-04-21 ENCOUNTER — Other Ambulatory Visit (INDEPENDENT_AMBULATORY_CARE_PROVIDER_SITE_OTHER): Payer: Medicare Other

## 2012-04-21 ENCOUNTER — Encounter: Payer: Self-pay | Admitting: Internal Medicine

## 2012-04-21 VITALS — BP 142/90 | HR 70 | Temp 97.0°F | Resp 16 | Wt 218.0 lb

## 2012-04-21 DIAGNOSIS — D519 Vitamin B12 deficiency anemia, unspecified: Secondary | ICD-10-CM

## 2012-04-21 DIAGNOSIS — D509 Iron deficiency anemia, unspecified: Secondary | ICD-10-CM

## 2012-04-21 DIAGNOSIS — D518 Other vitamin B12 deficiency anemias: Secondary | ICD-10-CM

## 2012-04-21 DIAGNOSIS — D649 Anemia, unspecified: Secondary | ICD-10-CM

## 2012-04-21 DIAGNOSIS — G479 Sleep disorder, unspecified: Secondary | ICD-10-CM

## 2012-04-21 DIAGNOSIS — G4710 Hypersomnia, unspecified: Secondary | ICD-10-CM

## 2012-04-21 LAB — CBC WITH DIFFERENTIAL/PLATELET
Eosinophils Relative: 2.2 % (ref 0.0–5.0)
HCT: 39 % (ref 39.0–52.0)
Lymphocytes Relative: 26.4 % (ref 12.0–46.0)
Monocytes Relative: 8.6 % (ref 3.0–12.0)
Neutrophils Relative %: 62.7 % (ref 43.0–77.0)
Platelets: 256 10*3/uL (ref 150.0–400.0)
WBC: 5.3 10*3/uL (ref 4.5–10.5)

## 2012-04-21 LAB — IBC PANEL
Iron: 44 ug/dL (ref 42–165)
Saturation Ratios: 11.5 % — ABNORMAL LOW (ref 20.0–50.0)
Transferrin: 272.5 mg/dL (ref 212.0–360.0)

## 2012-04-21 LAB — RETICULOCYTES
RBC.: 4.73 MIL/uL (ref 4.22–5.81)
Retic Ct Pct: 1.2 % (ref 0.4–2.3)

## 2012-04-21 NOTE — Progress Notes (Signed)
Subjective:    Patient ID: [name], male    DOB: [birth year], 78 y.o.   MRN: [id number]  HPI Mr. Gens presents for a second opinion in regard to the evaluation for iron deficiency anemia. He reports that the [place] has checked his H/H and it was low and that he was iron deficient and he was advised to have colonoscopy and EGD. He last had colonoscopy in '09 with an adenomatous polyp excised. He was to have repeat study in '14. He has not seen any blood in his stools, he has not had any hematemesis or dyspepsia  and has been feeling well.   Past Medical History  Diagnosis Date  . History of colon cancer   . Hypertension   . Osteoporosis   . Peripheral neuropathy   . Allergic rhinitis   . BPH (benign prostatic hypertrophy)   . Posttraumatic stress disorder   . DJD (degenerative joint disease)   . Dyspepsia   . DM2 (diabetes mellitus, type 2)   . Hearing loss     bilateral  . Cataract     bilateral extrraction and lens implants '88-,'89   Past Surgical History  Procedure Date  . Colon surgery     for colon cancer march 2005  . Arthroscopy, right knee   . Total knee arthroplasty     marthc '12, right  . Inner ear surgery     excision of tumor, then two surgeries: prosthesis, then had to remove.  '10   Family History  Problem Relation Age of Onset  . Cancer Mother   . Cancer Father   . Cancer Sister     pancreatic  . Cancer Brother     prostate  . Cancer Sister     Breast cancer  . Cancer Sister     Breast cancer   History   Social History  . Marital Status: Married    Spouse Name: N/A    Number of Children: N/A  . Years of Education: N/A   Occupational History  . Not on file.   Social History Main Topics  . Smoking status: Former Games developer  . Smokeless tobacco: Not on file  . Alcohol Use: Yes     occasional  . Drug Use: No  . Sexually Active: Not on file   Other Topics Concern  . Not on file   Social History Narrative   HSG.  Married '61- '73,  divorced; married '79.  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  1 adopted son- from [place], 2 step-sons. Disability with multiple military related problems. Did work for the [employer] until 1993. Lives in [place].    Current Outpatient Prescriptions on File Prior to Visit  Medication Sig Dispense Refill  . allopurinol (ZYLOPRIM) 300 MG tablet Take 300 mg by mouth daily. For Gout.       . bisacodyl (DULCOLAX) 5 MG EC tablet Take 10 mg by mouth 2 (two) times daily as needed.        Marland Kitchen BONIVA 150 MG tablet TAKE AS DIRECTED  3 each  4  . Calcium Carbonate-Vitamin D (CALCIUM 500 + D PO) Take by mouth.        . cetirizine (ZYRTEC) 10 MG tablet Take 10 mg by mouth as needed.      . chlorpheniramine (CHLOR-TRIMETON) 4 MG tablet Take 4 mg by mouth daily.        . ciprofloxacin-dexamethasone (CIPRODEX) otic suspension Place 4 drops into the right ear as needed.      Marland Kitchen  clonazePAM (KLONOPIN) 0.5 MG tablet Take 0.5 mg by mouth every 6 (six) hours as needed.        . clotrimazole (LOTRIMIN) 1 % cream Apply 1 application topically 2 (two) times daily. To toenails and bottom of feet       . cyanocobalamin 1000 MCG tablet Take 100 mcg by mouth daily.        . folic acid (FOLVITE) 1 MG tablet Take 1 mg by mouth daily.        Marland Kitchen gabapentin (NEURONTIN) 300 MG capsule Take 300 mg by mouth 3 (three) times daily as needed. Foot pain.       Marland Kitchen glucose blood test strip 1 each by Other route as needed. Precision Xtra Blood Glucose Strip. Use as instructed       . HYDROcodone-acetaminophen (NORCO) 5-325 MG per tablet Take 1 tablet by mouth every 6 (six) hours as needed.      . indomethacin (INDOCIN) 50 MG capsule Take 50 mg by mouth every 6 (six) hours as needed.        Marland Kitchen lisinopril-hydrochlorothiazide (PRINZIDE,ZESTORETIC) 20-25 MG per tablet Take 1 tablet by mouth daily.        . meclizine (ANTIVERT) 12.5 MG tablet Take 25 mg by mouth 3 (three) times daily as needed. Dizziness.      . meloxicam (MOBIC) 15 MG tablet Take 15 mg by  mouth daily.      . metFORMIN (GLUCOPHAGE) 500 MG tablet Take 500 mg by mouth 2 (two) times daily with a meal.        . metoprolol succinate (TOPROL XL) 25 MG 24 hr tablet Take 1 tablet (25 mg total) by mouth daily.  30 tablet  11  . multivitamin-lutein (OCUVITE-LUTEIN) CAPS Take 1 capsule by mouth as needed.      . naproxen (NAPROSYN) 250 MG tablet Take 250 mg by mouth 2 (two) times daily with a meal.      . ofloxacin (FLOXIN) 0.3 % otic solution Place 10 drops into the right ear 2 (two) times daily.        Marland Kitchen omeprazole (PRILOSEC) 20 MG capsule Take 20 mg by mouth daily.        . phosphate (FLEET) enema Place 1 enema rectally once as needed.        . pyridoxine (B-6) 50 MG tablet Take 50 mg by mouth daily.        . salsalate (DISALCID) 750 MG tablet Take 750 mg by mouth 2 (two) times daily.        . sildenafil (VIAGRA) 100 MG tablet Take 100 mg by mouth daily as needed.        . vardenafil (LEVITRA) 20 MG tablet Take 10 mg by mouth daily as needed.        . venlafaxine (EFFEXOR-XR) 75 MG 24 hr capsule Take 75 mg by mouth 2 (two) times daily.            Review of Systems System review is negative for any constitutional, cardiac, pulmonary, GI or neuro symptoms or complaints other than as described in the HPI.     Objective:   Physical Exam Filed Vitals:   04/21/12 1043  BP: 142/90  Pulse: 70  Temp: 97 F (36.1 C)  Resp: 16   Wt Readings from Last 3 Encounters:  04/21/12 218 lb (98.884 kg)  04/16/12 217 lb (98.431 kg)  11/01/11 212 lb 6.4 oz (96.344 kg)   Gen'l- WNWD older AA man no distress  Cor- RRR PUlm normal respirations.  Neuro - A&O x 3, ambulating with the use of a cane.        Assessment & Plan:  Anemia - [facility] report of [facility] he has iron deficiency anemia but no labs are available to me.  Plan -  patient agrees to repeat labs: CBC, IBC, retic count with recommendations to follow.  If he needs GI work-up he prefers having it here with Dr. Leone Payor

## 2012-04-22 NOTE — Assessment & Plan Note (Signed)
Patient is to be scheduled for sleep study - [facility] recommendation and they are covering cost.

## 2012-04-27 ENCOUNTER — Encounter: Payer: Self-pay | Admitting: Internal Medicine

## 2012-05-13 ENCOUNTER — Ambulatory Visit (HOSPITAL_BASED_OUTPATIENT_CLINIC_OR_DEPARTMENT_OTHER): Payer: Medicare Other | Attending: Internal Medicine | Admitting: Radiology

## 2012-05-13 VITALS — Ht 74.0 in | Wt 218.0 lb

## 2012-05-13 DIAGNOSIS — G4733 Obstructive sleep apnea (adult) (pediatric): Secondary | ICD-10-CM

## 2012-05-13 DIAGNOSIS — G479 Sleep disorder, unspecified: Secondary | ICD-10-CM

## 2012-05-13 DIAGNOSIS — G4710 Hypersomnia, unspecified: Secondary | ICD-10-CM | POA: Insufficient documentation

## 2012-05-17 DIAGNOSIS — G4710 Hypersomnia, unspecified: Secondary | ICD-10-CM

## 2012-05-17 DIAGNOSIS — G4730 Sleep apnea, unspecified: Secondary | ICD-10-CM

## 2012-05-17 NOTE — Procedures (Signed)
NAMECUSTER, PIMENTA                ACCOUNT NO.:  [id number]  MEDICAL RECORD NO.:  [id number]          PATIENT TYPE:  OUT  LOCATION:  SLEEP CENTER                 FACILITY:  [facility]  PHYSICIAN:  Barbaraann Share, MD,FCCPDATE OF BIRTH:  Dec 08[birth year] 1934  DATE OF STUDY:  05/13/2012                           NOCTURNAL POLYSOMNOGRAM  REFERRING PHYSICIAN:  Rosalyn Gess. Norins, MD  INDICATION FOR STUDY:  Hypersomnia with sleep apnea.  EPWORTH SLEEPINESS SCORE:  23.  SLEEP ARCHITECTURE:  The patient had a total sleep time of 276 minutes with no slow-wave sleep or REM being noted.  Sleep onset latency was normal at 8 minutes and sleep efficiency was moderately reduced at 71%.  RESPIRATORY DATA:  The patient had no obstructive apneas and only 1 obstructive hypopnea, giving him an apnea/hypopnea index of 0.2 events per hour.  It should be noted that he did not achieve slow wave sleep or REM, but had more than adequate sleep in order to manifest clinically significant sleep-disordered breathing.  There was moderate snoring noted throughout.  OXYGEN DATA:  Transient O2 desaturation as low as 80% during the night.  CARDIAC DATA:  Occasional PAC noted, but no clinically significant arrhythmias were seen.  MOVEMENT/PARASOMNIA:  The patient was found to have 248 periodic limb movements, with 29 per hour resulting in arousal or awakening.  There were no abnormal behaviors seen.  IMPRESSION/RECOMMENDATION: 1. Small numbers of obstructive events, which do not meet the AHI     criteria for the obstructive sleep apnea syndrome. 2. Occasional premature atrial contraction noted, but no clinically     significant arrhythmias were seen. 3. Very large numbers of leg jerks with significant sleep disruption.     This is very suspicious for a movement disorder of sleep such as     the restless legs syndrome or periodic limb movement syndrome.  It     is noted the patient has a history of iron deficiency anemia,  which     is a common cause for the restless legs syndrome.  This     will typically respond to repletion of iron stores, and also a     dopamine agonist if needed.  Clinical correlation is suggested.     Barbaraann Share, MD,FCCP Diplomate, American Board of Sleep Medicine    [facility]/MEDQ  D:  05/17/2012 14:59:33  T:  05/17/2012 20:42:06  Job:  [id number]

## 2012-06-10 ENCOUNTER — Ambulatory Visit (INDEPENDENT_AMBULATORY_CARE_PROVIDER_SITE_OTHER): Payer: Medicare Other | Admitting: Internal Medicine

## 2012-06-10 ENCOUNTER — Other Ambulatory Visit (INDEPENDENT_AMBULATORY_CARE_PROVIDER_SITE_OTHER): Payer: Medicare Other

## 2012-06-10 ENCOUNTER — Encounter: Payer: Self-pay | Admitting: Internal Medicine

## 2012-06-10 VITALS — BP 154/68 | HR 76 | Ht 74.5 in | Wt 217.0 lb

## 2012-06-10 DIAGNOSIS — D509 Iron deficiency anemia, unspecified: Secondary | ICD-10-CM

## 2012-06-10 DIAGNOSIS — Z85038 Personal history of other malignant neoplasm of large intestine: Secondary | ICD-10-CM

## 2012-06-10 DIAGNOSIS — Z791 Long term (current) use of non-steroidal anti-inflammatories (NSAID): Secondary | ICD-10-CM

## 2012-06-10 LAB — CBC WITH DIFFERENTIAL/PLATELET
Basophils Relative: 0.5 % (ref 0.0–3.0)
Eosinophils Absolute: 0.2 10*3/uL (ref 0.0–0.7)
Lymphs Abs: 1.4 10*3/uL (ref 0.7–4.0)
MCHC: 31.9 g/dL (ref 30.0–36.0)
MCV: 83.6 fl (ref 78.0–100.0)
Monocytes Absolute: 0.4 10*3/uL (ref 0.1–1.0)
Neutrophils Relative %: 55.8 % (ref 43.0–77.0)
Platelets: 274 10*3/uL (ref 150.0–400.0)

## 2012-06-10 LAB — FERRITIN: Ferritin: 13.9 ng/mL — ABNORMAL LOW (ref 22.0–322.0)

## 2012-06-10 MED ORDER — SUPREP BOWEL PREP KIT 17.5-3.13-1.6 GM/177ML PO SOLN: ORAL | Status: DC

## 2012-06-10 NOTE — Progress Notes (Signed)
  Subjective:    Patient ID: [name], male    DOB: December 09, 1932, 78 y.o.   MRN: [id number]  HPI This very pleasant elderly African American man presents with his wife. He was diagnosed with an iron deficiency anemia at the [place]. He saw his primary care physician, who confirmed this with a low iron saturation and low iron though his TIBC was in the middle. His B12 level is 225 which is low normal. His hemoglobin was 12.3 in September with an RDW of 16% which is mildly elevated but a normal MCV. He thinks his stools have become dark since starting some iron recently though is not 100% convinced it was iron tablets he was told to start those a month ago after being seen at the [place]. [facility] endoscopy and a colonoscopy. Because of his history with me he elected to return here to see about having procedures done. He denies rectal bleeding. There is occasional constipation. He has rare abdominal pain and heartburn but otherwise has no significant GI problems.  He does have a history of colon cancer resected in 2005 after I discovered the colonoscopy, followup colonoscopies have shown adenomatous polyps, routine screening and surveillance colonoscopy was due in 2014.  Medications, allergies, past medical history, past surgical history, family history and social history are reviewed and updated in the EMR.  Review of Systems Decreased hearing. All other review of systems are negative at this time except as mentioned in the history of present illness.    Objective:   Physical Exam General:  Well-developed, well-nourished and in no acute distress Eyes:  anicteric. ENT:   Mouth and posterior pharynx free of lesions. Hearing is reduced Neck:   supple w/o thyromegaly or mass.  Lungs: Clear to auscultation bilaterally. Heart:  S1S2, no rubs, murmurs, gallops. Abdomen:  soft, non-tender, no hepatosplenomegaly, hernia, or mass and BS+.  Rectal: Soft brown stool heme-negative no rectal mass  normal tone normal anoderm Lymph:  no cervical or supraclavicular adenopathy. Extremities:   no edema Skin   no rash. Neuro:  A&O x 3.  Psych:  appropriate mood and  Affect.   Data Reviewed: Lab Results  Component Value Date   WBC 5.3 04/21/2012   HGB 12.3* 04/21/2012   HCT 39.0 04/21/2012   MCV 83.9 04/21/2012   PLT 256.0 04/21/2012   Lab Results  Component Value Date   IRON 44 04/21/2012   Lab Results  Component Value Date   VITAMINB12 225 04/21/2012      Assessment & Plan:   1. Iron deficiency anemia, unspecified   I do have some question as to whether or not this could be chronic disease anemia along with a B12 deficiency though could have a component of iron deficiency.   2. Personal history of colon cancer   3. Long term current use of non-steroidal anti-inflammatories (NSAID) indomethacin and or meloxicam    1. CBC and ferritin level today 2. Given his overall situation with chronic NSAID use, history of colon cancer and his lab results I do think an upper GI endoscopy and colonoscopy are reasonable.The risks and benefits as well as alternatives of endoscopic procedure(s) have been discussed and reviewed. All questions answered. The patient agrees to proceed. 3. Further plans pending these results and clinical course.  I appreciate the opportunity to care for this patient.   CC: Illene Regulus, MD

## 2012-06-10 NOTE — Patient Instructions (Addendum)
You have been scheduled for an endoscopy and colonoscopy with propofol. Please follow the written instructions given to you at your visit today. Please pick up your prep at the pharmacy within the next 1-3 days. If you use inhalers (even only as needed) or a CPAP machine, please bring them with you on the day of your procedure.  We have sent the following medications to your pharmacy for you to pick up at your convenience: CBC, Ferritin

## 2012-06-13 ENCOUNTER — Ambulatory Visit (AMBULATORY_SURGERY_CENTER): Payer: Medicare Other | Admitting: Internal Medicine

## 2012-06-13 ENCOUNTER — Encounter: Payer: Self-pay | Admitting: Internal Medicine

## 2012-06-13 VITALS — BP 180/88 | HR 67 | Temp 97.2°F | Resp 20 | Ht 74.5 in | Wt 217.0 lb

## 2012-06-13 DIAGNOSIS — Z85038 Personal history of other malignant neoplasm of large intestine: Secondary | ICD-10-CM

## 2012-06-13 DIAGNOSIS — K2990 Gastroduodenitis, unspecified, without bleeding: Secondary | ICD-10-CM

## 2012-06-13 DIAGNOSIS — D126 Benign neoplasm of colon, unspecified: Secondary | ICD-10-CM

## 2012-06-13 DIAGNOSIS — K2970 Gastritis, unspecified, without bleeding: Secondary | ICD-10-CM

## 2012-06-13 DIAGNOSIS — D509 Iron deficiency anemia, unspecified: Secondary | ICD-10-CM

## 2012-06-13 LAB — GLUCOSE, CAPILLARY: Glucose-Capillary: 86 mg/dL (ref 70–99)

## 2012-06-13 MED ORDER — SODIUM CHLORIDE 0.9 % IV SOLN: 500.0000 mL | INTRAVENOUS | Status: DC

## 2012-06-13 MED ORDER — DEXTROSE 5 % IV SOLN: INTRAVENOUS | Status: DC

## 2012-06-13 NOTE — Op Note (Signed)
[facility] [address] [place], [address]   ENDOSCOPY PROCEDURE REPORT  PATIENT: [name], [name]  MR#: [id number] BIRTHDATE: 05/26/1933 , 79  yrs. old GENDER: Male ENDOSCOPIST: Iva Boop, MD, [facility] PROCEDURE DATE:  06/13/2012 PROCEDURE:  EGD w/ biopsy ASA CLASS:     Class II INDICATIONS:  iron deficiency anemia. MEDICATIONS: propofol (Diprivan) 100mg  IV, MAC sedation, administered by CRNA, and These medications were titrated to patient response per physician's verbal order TOPICAL ANESTHETIC: Cetacaine Spray  DESCRIPTION OF PROCEDURE: After the risks benefits and alternatives of the procedure were thoroughly explained, informed consent was obtained.  The LB-GIF [id number] [id number] endoscope was introduced through the mouth and advanced to the second portion of the duodenum. Without limitations.  The instrument was slowly withdrawn as the mucosa was fully examined.      STOMACH: Mild non-erosive gastritis (inflammation) was found in the gastric antrum.  Multiple biopsies were performed using cold forceps.  Sample sent for histology.  The remainder of the upper endoscopy exam was otherwise normal. Retroflexed views revealed no abnormalities.     The scope was then withdrawn from the patient and the procedure completed.  COMPLICATIONS: There were no complications. ENDOSCOPIC IMPRESSION: 1.   Non-erosive gastritis (inflammation) was found in the gastric antrum; multiple biopsies 2.   The remainder of the upper endoscopy exam was otherwise normal  RECOMMENDATIONS: 1.  await pathology results 2.  Proceed with a Colonoscopy.    eSigned:  Iva Boop, MD, [facility] 06/13/2012 9:38 AM   XB:[id number] Esther Hardy, MD and The Patient

## 2012-06-13 NOTE — Op Note (Signed)
[facility] [address] [place], [address]   COLONOSCOPY PROCEDURE REPORT  PATIENT: [name], [name]  MR#: [id number] BIRTHDATE: 03-10-1933 , 79  yrs. old GENDER: Male ENDOSCOPIST: Iva Boop, MD, [facility] PROCEDURE DATE:  06/13/2012 PROCEDURE:   Colonoscopy with biopsy ASA CLASS:   Class II INDICATIONS:iron deficiency anemia. MEDICATIONS: There was residual sedation effect present from prior procedure, propofol (Diprivan) 100mg  IV, MAC sedation, administered by CRNA, and These medications were titrated to patient response per physician's verbal order  DESCRIPTION OF PROCEDURE:   After the risks benefits and alternatives of the procedure were thoroughly explained, informed consent was obtained.  A digital rectal exam revealed no abnormalities of the rectum.   The LB [id number]  endoscope was introduced through the anus and advanced to the surgical anastomosis. No adverse events experienced.   The quality of the prep was Suprep excellent  The instrument was then slowly withdrawn as the colon was fully examined.      COLON FINDINGS: A polypoid shaped sessile polyp measuring 3 mm in size was found in the proximal transverse colon near anastomosis. A polypectomy was performed with cold forceps.  The resection was complete and the polyp tissue was completely retrieved.   There was evidence of a prior end-to-side ileocolonic surgical anastomosis in the transverse colon.   This was stenotic and ileum coul not be entered. The colon mucosa was otherwise normal.  Retroflexed views revealed no abnormalities. The time to cecum=3 minutes 19 seconds. Withdrawal time=12 minutes 16 seconds.  The scope was withdrawn and the procedure completed. COMPLICATIONS: There were no complications.  ENDOSCOPIC IMPRESSION: 1.   Sessile polyp measuring 3 mm in size was found in the proximal transverse colon; polypectomy was performed with cold forceps 2.   There was  evidence of a prior ileocolonic surgical anastomosis in the transverse colon - there was some stenosis 3.   The colon mucosa was otherwise normal  RECOMMENDATIONS: 1.  Await pathology results 2.  Office will call with the results. NSAIFD enteropathy possible cause - gastritis bxs obn EGD pending also 3.  Repeat Colonoscopy in 5 years. - personal hx colon cancer   eSigned:  Iva Boop, MD, [facility] 06/13/2012 9:44 AM   cc: Jacques Navy, MD and The Patient

## 2012-06-13 NOTE — Progress Notes (Signed)
1610 - Pt in recovery, AxO x 3, VSS

## 2012-06-13 NOTE — Progress Notes (Signed)
Patient did not experience any of the following events: a burn prior to discharge; a fall within the facility; wrong site/side/patient/procedure/implant event; or a hospital transfer or hospital admission upon discharge from the facility. (G8907) Patient did not have preoperative order for IV antibiotic SSI prophylaxis. (G8918)  

## 2012-06-13 NOTE — Patient Instructions (Addendum)
The stomach lining looked irritated - this is called gastritis. I took biopsies of that. One tiny polyp was removed from the colon. The area where your colon and small intestine were sewn together looks a litte narrowed - probably not a problem. I cannot tell you why your iron is low at this time. My office will call with biopsy results and recommendations. Stay on your iron. You can show your reports to your [facility] doctors.  Thank you for choosing me and [facility]. Iva Boop, MD, FACG YOU HAD AN ENDOSCOPIC PROCEDURE TODAY AT THE [facility]: Refer to the procedure report that was given to you for any specific questions about what was found during the examination.  If the procedure report does not answer your questions, please call your gastroenterologist to clarify.  If you requested that your care partner not be given the details of your procedure findings, then the procedure report has been included in a sealed envelope for you to review at your convenience later.  YOU SHOULD EXPECT: Some feelings of bloating in the abdomen. Passage of more gas than usual.  Walking can help get rid of the air that was put into your GI tract during the procedure and reduce the bloating. If you had a lower endoscopy (such as a colonoscopy or flexible sigmoidoscopy) you may notice spotting of blood in your stool or on the toilet paper. If you underwent a bowel prep for your procedure, then you may not have a normal bowel movement for a few days.  DIET: Your first meal following the procedure should be a light meal and then it is ok to progress to your normal diet.  A half-sandwich or bowl of soup is an example of a good first meal.  Heavy or fried foods are harder to digest and may make you feel nauseous or bloated.  Likewise meals heavy in dairy and vegetables can cause extra gas to form and this can also increase the bloating.  Drink plenty of fluids but you should avoid alcoholic  beverages for 24 hours.  ACTIVITY: Your care partner should take you home directly after the procedure.  You should plan to take it easy, moving slowly for the rest of the day.  You can resume normal activity the day after the procedure however you should NOT DRIVE or use heavy machinery for 24 hours (because of the sedation medicines used during the test).    SYMPTOMS TO REPORT IMMEDIATELY: A gastroenterologist can be reached at any hour.  During normal business hours, 8:30 AM to 5:00 PM Monday through Friday, call [phone].  After hours and on weekends, please call the GI answering service at [phone] who will take a message and have the physician on call contact you.   Following lower endoscopy (colonoscopy or flexible sigmoidoscopy):  Excessive amounts of blood in the stool  Significant tenderness or worsening of abdominal pains  Swelling of the abdomen that is new, acute  Fever of 100F or higher  Following upper endoscopy (EGD)  Vomiting of blood or coffee ground material  New chest pain or pain under the shoulder blades  Painful or persistently difficult swallowing  New shortness of breath  Fever of 100F or higher  Black, tarry-looking stools  FOLLOW UP: If any biopsies were taken you will be contacted by phone or by letter within the next 1-3 weeks.  Call your gastroenterologist if you have not heard about the biopsies in 3 weeks.  Our staff  will call the home number listed on your records the next business day following your procedure to check on you and address any questions or concerns that you may have at that time regarding the information given to you following your procedure. This is a courtesy call and so if there is no answer at the home number and we have not heard from you through the emergency physician on call, we will assume that you have returned to your regular daily activities without incident.  SIGNATURES/CONFIDENTIALITY: You and/or your care partner  have signed paperwork which will be entered into your electronic medical record.  These signatures attest to the fact that that the information above on your After Visit Summary has been reviewed and is understood.  Full responsibility of the confidentiality of this discharge information lies with you and/or your care-partner.

## 2012-06-16 ENCOUNTER — Telehealth: Payer: Self-pay | Admitting: *Deleted

## 2012-06-16 NOTE — Telephone Encounter (Signed)
Left message on number given in admitting per pt on Friday. ewm

## 2012-06-17 ENCOUNTER — Encounter: Payer: Self-pay | Admitting: Internal Medicine

## 2012-06-17 NOTE — Progress Notes (Signed)
Quick Note:  He has H. Pylori gastritis  Treat with Amoxicillin 1000 mg and Biaxin 500 mg both bid x 10 days and needs to take omeprazole 20 mg bid while on antibiotics, then back to 20 mg daily omeprazole (can use a Prilosec OTC as extra dose)  Will also send him a letter - colon polyp was benign ______

## 2012-06-19 ENCOUNTER — Other Ambulatory Visit: Payer: Self-pay

## 2012-06-19 MED ORDER — OMEPRAZOLE 20 MG PO CPDR: DELAYED_RELEASE_CAPSULE | ORAL | Status: DC

## 2012-06-19 MED ORDER — CLARITHROMYCIN 500 MG PO TABS: 500.0000 mg | ORAL_TABLET | Freq: Two times a day (BID) | ORAL | Status: DC

## 2012-06-19 MED ORDER — AMOXICILLIN 500 MG PO CAPS: 1000.0000 mg | ORAL_CAPSULE | Freq: Two times a day (BID) | ORAL | Status: DC

## 2012-06-23 ENCOUNTER — Other Ambulatory Visit: Payer: Self-pay | Admitting: Internal Medicine

## 2012-09-04 ENCOUNTER — Ambulatory Visit (INDEPENDENT_AMBULATORY_CARE_PROVIDER_SITE_OTHER): Payer: Medicare Other | Admitting: Family Medicine

## 2012-09-04 VITALS — BP 166/105 | HR 75 | Temp 97.9°F | Resp 18 | Ht 73.5 in | Wt 216.0 lb

## 2012-09-04 DIAGNOSIS — J40 Bronchitis, not specified as acute or chronic: Secondary | ICD-10-CM

## 2012-09-04 DIAGNOSIS — R05 Cough: Secondary | ICD-10-CM

## 2012-09-04 DIAGNOSIS — J069 Acute upper respiratory infection, unspecified: Secondary | ICD-10-CM

## 2012-09-04 DIAGNOSIS — I10 Essential (primary) hypertension: Secondary | ICD-10-CM

## 2012-09-04 DIAGNOSIS — R0602 Shortness of breath: Secondary | ICD-10-CM

## 2012-09-04 LAB — POCT CBC
HCT, POC: 42.4 % — AB (ref 43.5–53.7)
Hemoglobin: 13.2 g/dL — AB (ref 14.1–18.1)
Lymph, poc: 2.2 (ref 0.6–3.4)
MCH, POC: 27.7 pg (ref 27–31.2)
MCHC: 31.1 g/dL — AB (ref 31.8–35.4)
MCV: 88.8 fL (ref 80–97)
POC Granulocyte: 2.5 (ref 2–6.9)
POC LYMPH PERCENT: 41.8 %L (ref 10–50)
RDW, POC: 17.5 %
WBC: 5.3 10*3/uL (ref 4.6–10.2)

## 2012-09-04 LAB — POCT INFLUENZA A/B: Influenza A, POC: NEGATIVE

## 2012-09-04 MED ORDER — AZITHROMYCIN 250 MG PO TABS: ORAL_TABLET | ORAL | Status: DC

## 2012-09-04 MED ORDER — BENZONATATE 200 MG PO CAPS: 200.0000 mg | ORAL_CAPSULE | Freq: Three times a day (TID) | ORAL | Status: DC | PRN

## 2012-09-04 MED ORDER — GUAIFENESIN-CODEINE 100-10 MG/5ML PO SYRP: 5.0000 mL | ORAL_SOLUTION | Freq: Three times a day (TID) | ORAL | Status: DC | PRN

## 2012-09-04 NOTE — Patient Instructions (Addendum)
Upper Respiratory Infection, Adult  An upper respiratory infection (URI) is also known as the common cold. It is often caused by a type of germ (virus). Colds are easily spread (contagious). You can pass it to others by kissing, coughing, sneezing, or drinking out of the same glass. Usually, you get better in 1 or 2 weeks.   HOME CARE    Only take medicine as told by your doctor.   Use a warm mist humidifier or breathe in steam from a hot shower.   Drink enough water and fluids to keep your pee (urine) clear or pale yellow.   Get plenty of rest.   Return to work when your temperature is back to normal or as told by your doctor. You may use a face mask and wash your hands to stop your cold from spreading.  GET HELP RIGHT AWAY IF:    After the first few days, you feel you are getting worse.   You have questions about your medicine.   You have chills, shortness of breath, or brown or red spit (mucus).   You have yellow or brown snot (nasal discharge) or pain in the face, especially when you bend forward.   You have a fever, puffy (swollen) neck, pain when you swallow, or white spots in the back of your throat.   You have a bad headache, ear pain, sinus pain, or chest pain.   You have a high-pitched whistling sound when you breathe in and out (wheezing).   You have a lasting cough or cough up blood.   You have sore muscles or a stiff neck.  MAKE SURE YOU:    Understand these instructions.   Will watch your condition.   Will get help right away if you are not doing well or get worse.  Document Released: 01/16/2008 Document Revised: 10/22/2011 Document Reviewed: 12/04/2010  ExitCare Patient Information 2013 [employer].    Bronchitis  Bronchitis is the body's way of reacting to injury and/or infection (inflammation) of the bronchi. Bronchi are the air tubes that extend from the windpipe into the lungs. If the inflammation becomes severe, it may cause shortness of breath.  CAUSES   Inflammation may be  caused by:   A virus.   Germs (bacteria).   Dust.   Allergens.   Pollutants and many other irritants.  The cells lining the bronchial tree are covered with tiny hairs (cilia). These constantly beat upward, away from the lungs, toward the mouth. This keeps the lungs free of pollutants. When these cells become too irritated and are unable to do their job, mucus begins to develop. This causes the characteristic cough of bronchitis. The cough clears the lungs when the cilia are unable to do their job. Without either of these protective mechanisms, the mucus would settle in the lungs. Then you would develop pneumonia.  Smoking is a common cause of bronchitis and can contribute to pneumonia. Stopping this habit is the single most important thing you can do to help yourself.  TREATMENT    Your caregiver may prescribe an antibiotic if the cough is caused by bacteria. Also, medicines that open up your airways make it easier to breathe. Your caregiver may also recommend or prescribe an expectorant. It will loosen the mucus to be coughed up. Only take over-the-counter or prescription medicines for pain, discomfort, or fever as directed by your caregiver.   Removing whatever causes the problem (smoking, for example) is critical to preventing the problem from getting worse.     Cough suppressants may be prescribed for relief of cough symptoms.   Inhaled medicines may be prescribed to help with symptoms now and to help prevent problems from returning.   For those with recurrent (chronic) bronchitis, there may be a need for steroid medicines.  SEEK IMMEDIATE MEDICAL CARE IF:    During treatment, you develop more pus-like mucus (purulent sputum).   You have a fever.   Your baby is older than 3 months with a rectal temperature of 102 F (38.9 C) or higher.   Your baby is 3 months old or younger with a rectal temperature of 100.4 F (38 C) or higher.   You become progressively more ill.   You have increased difficulty  breathing, wheezing, or shortness of breath.  It is necessary to seek immediate medical care if you are elderly or sick from any other disease.  MAKE SURE YOU:    Understand these instructions.   Will watch your condition.   Will get help right away if you are not doing well or get worse.  Document Released: 07/30/2005 Document Revised: 10/22/2011 Document Reviewed: 06/08/2008  Exit[employer] Patient Information 2013 [employer].

## 2012-09-04 NOTE — Progress Notes (Signed)
Subjective:    Patient ID: [name], male    DOB: [birth year], 79 y.o.   MRN: [id number] Chief Complaint  Patient presents with  . Cough  . Shortness of Breath    HPI  Sxs started 2d prev with nasal congestion and post-nasal drip, can't sleep due to cough. HA this a.m. - took tylenol which helped temporarily.  Cough is causing chest soreness.  Coughing up mucous. Has been taking some throat lozenges and some mucous med. Have had a temp but no chills. He was going to the [place] [facility] in [place] and was checked in and waiting but his ride had to come back to [place] so he left and the friend who was driving suggested he come here instead of going to [facility].    Did get flu shot sev mos ago and TDaP 2 wks ago.  Prosthesis in Rt ear Sees cardiologist Dr. Excell Seltzer - been exposed to [facility] and had calcification of his heart years ago so wore 30d heart monitor continuous and was told he was having heart palpitations - nothing serious - but they did put him on a medication (Toprol) as it occurred at night while he was sleeping - all this was about 5 mos ago.  Past Medical History  Diagnosis Date  . History of colon cancer   . Hypertension   . Osteoporosis   . Peripheral neuropathy   . Allergic rhinitis   . BPH (benign prostatic hypertrophy)   . Posttraumatic stress disorder   . DJD (degenerative joint disease)   . Dyspepsia   . DM2 (diabetes mellitus, type 2)   . Hearing loss     bilateral  . Cataract     bilateral extrraction and lens implants '88-,'89  . Tubular adenoma 02/17/2008  . Diverticulosis   . Anemia   . Cancer   . GERD (gastroesophageal reflux disease)   . Depression   . Glaucoma    Current Outpatient Prescriptions on File Prior to Visit  Medication Sig Dispense Refill  . allopurinol (ZYLOPRIM) 300 MG tablet Take 300 mg by mouth daily. For Gout.       . bisacodyl (DULCOLAX) 5 MG EC tablet Take 10 mg by mouth 2 (two) times daily as needed.        . Calcium Carbonate-Vitamin  D (CALCIUM 500 + D PO) Take by mouth.        . cetirizine (ZYRTEC) 10 MG tablet Take 10 mg by mouth as needed.      . chlorpheniramine (CHLOR-TRIMETON) 4 MG tablet Take 4 mg by mouth daily.        . ciprofloxacin-dexamethasone (CIPRODEX) otic suspension Place 4 drops into the right ear as needed.      . clonazePAM (KLONOPIN) 0.5 MG tablet Take 0.5 mg by mouth every 6 (six) hours as needed.        . clotrimazole (LOTRIMIN) 1 % cream Apply 1 application topically 2 (two) times daily. To toenails and bottom of feet       . cyanocobalamin 1000 MCG tablet Take 100 mcg by mouth daily.        . folic acid (FOLVITE) 1 MG tablet Take 1 mg by mouth daily.        Marland Kitchen gabapentin (NEURONTIN) 300 MG capsule Take 300 mg by mouth 3 (three) times daily as needed. Foot pain.       Marland Kitchen glucose blood test strip 1 each by Other route as needed. Precision Xtra Blood Glucose  Strip. Use as instructed       . HYDROcodone-acetaminophen (NORCO) 5-325 MG per tablet Take 1 tablet by mouth every 6 (six) hours as needed.      . ibandronate (BONIVA) 150 MG tablet TAKE AS DIRECTED  3 tablet  4  . indomethacin (INDOCIN) 50 MG capsule Take 50 mg by mouth every 6 (six) hours as needed.        . IRON PO Take by mouth once.      Marland Kitchen lisinopril-hydrochlorothiazide (PRINZIDE,ZESTORETIC) 20-25 MG per tablet Take 1 tablet by mouth daily.        . meclizine (ANTIVERT) 12.5 MG tablet Take 25 mg by mouth 3 (three) times daily as needed. Dizziness.      . meloxicam (MOBIC) 15 MG tablet Take 15 mg by mouth daily.      . metFORMIN (GLUCOPHAGE) 500 MG tablet Take 500 mg by mouth 2 (two) times daily with a meal.        . metoprolol succinate (TOPROL XL) 25 MG 24 hr tablet Take 1 tablet (25 mg total) by mouth daily.  30 tablet  11  . multivitamin-lutein (OCUVITE-LUTEIN) CAPS Take 1 capsule by mouth as needed.      Marland Kitchen ofloxacin (FLOXIN) 0.3 % otic solution Place 10 drops into the right ear 2 (two) times daily.        Marland Kitchen omeprazole (PRILOSEC) 20 MG  capsule Take 20 mg by mouth daily.        . phosphate (FLEET) enema Place 1 enema rectally once as needed.        . pyridoxine (B-6) 50 MG tablet Take 50 mg by mouth daily.        . sildenafil (VIAGRA) 100 MG tablet Take 100 mg by mouth daily as needed.        . vardenafil (LEVITRA) 20 MG tablet Take 10 mg by mouth daily as needed.        . venlafaxine (EFFEXOR-XR) 75 MG 24 hr capsule Take 75 mg by mouth 2 (two) times daily.         No Known Allergies   Review of Systems  Constitutional: Positive for fever and fatigue. Negative for chills and diaphoresis.  HENT: Positive for hearing loss, nosebleeds, congestion, sore throat, rhinorrhea and postnasal drip. Negative for ear pain.   Respiratory: Positive for cough and shortness of breath. Negative for chest tightness and wheezing.   Cardiovascular: Positive for chest pain.  Musculoskeletal: Positive for arthralgias.  Neurological: Positive for headaches.  Hematological: Negative for adenopathy.  Psychiatric/Behavioral: Positive for sleep disturbance.      BP 166/105  Pulse 75  Temp 97.9 F (36.6 C) (Oral)  Resp 18  Ht 6' 1.5" (1.867 m)  Wt 216 lb (97.977 kg)  BMI 28.11 kg/m2  SpO2 96% Objective:   Physical Exam  Constitutional: He is oriented to person, place, and time. He appears well-developed and well-nourished. No distress.  HENT:  Head: Normocephalic and atraumatic.  Right Ear: External ear normal. Decreased hearing is noted.  Left Ear: Tympanic membrane, external ear and ear canal normal. Decreased hearing is noted.  Nose: Mucosal edema and rhinorrhea present. Right sinus exhibits no maxillary sinus tenderness. Left sinus exhibits no maxillary sinus tenderness.  Mouth/Throat: Uvula is midline and mucous membranes are normal. Posterior oropharyngeal erythema present. No oropharyngeal exudate or posterior oropharyngeal edema.       Rt prosthetic TM white  Eyes: Conjunctivae normal are normal. No scleral icterus.  Neck:  Normal range  of motion. Neck supple. No thyromegaly present.  Cardiovascular: Normal rate, normal heart sounds and intact distal pulses.  An irregular rhythm present.  Pulmonary/Chest: Effort normal and breath sounds normal. No respiratory distress. He has no wheezes. He has no rales.  Musculoskeletal: He exhibits no edema.  Lymphadenopathy:    He has no cervical adenopathy.  Neurological: He is alert and oriented to person, place, and time.  Skin: Skin is warm and dry. He is not diaphoretic. No erythema.  Psychiatric: He has a normal mood and affect. His behavior is normal.      Results for orders placed in visit on 09/04/12  POCT CBC      Component Value Range   WBC 5.3  4.6 - 10.2 K/uL   Lymph, poc 2.2  0.6 - 3.4   POC LYMPH PERCENT 41.8  10 - 50 %L   MID (cbc) 0.5  0 - 0.9   POC MID % 10.2  0 - 12 %M   POC Granulocyte 2.5  2 - 6.9   Granulocyte percent 48.0  37 - 80 %G   RBC 4.77  4.69 - 6.13 M/uL   Hemoglobin 13.2 (*) 14.1 - 18.1 g/dL   HCT, POC 45.4 (*) 09.8 - 53.7 %   MCV 88.8  80 - 97 fL   MCH, POC 27.7  27 - 31.2 pg   MCHC 31.1 (*) 31.8 - 35.4 g/dL   RDW, POC 11.9     Platelet Count, POC 254  142 - 424 K/uL   MPV 11.2  0 - 99.8 fL  POCT INFLUENZA A/B      Component Value Range   Influenza A, POC Negative     Influenza B, POC Negative      Assessment & Plan:   1. HYPERTENSION    2. Cough  POCT CBC, POCT Influenza A/B  3. Bronchitis  azithromycin (ZITHROMAX) 250 MG tablet, guaiFENesin-codeine (ROBITUSSIN AC) 100-10 MG/5ML syrup, benzonatate (TESSALON) 200 MG capsule  4. URI, acute

## 2012-10-07 ENCOUNTER — Encounter: Payer: Self-pay | Admitting: Internal Medicine

## 2012-10-07 ENCOUNTER — Ambulatory Visit (INDEPENDENT_AMBULATORY_CARE_PROVIDER_SITE_OTHER): Payer: Medicare Other | Admitting: Internal Medicine

## 2012-10-07 VITALS — BP 140/88 | HR 70 | Temp 97.6°F | Resp 10 | Wt 213.0 lb

## 2012-10-07 DIAGNOSIS — E119 Type 2 diabetes mellitus without complications: Secondary | ICD-10-CM

## 2012-10-07 NOTE — Progress Notes (Signed)
Subjective:    Patient ID: [name], male    DOB: [birth year], 79 y.o.   MRN: [id number]  HPI Mr. Vaile present for completion of application for diabetic shoes under medicare. He does not meet eligibility criteria.  He was seen at urgent care for bronchitis 1/25/'14 and has made a full recovery  PMH, FamHx and SocHx reviewed for any changes and relevance.  Current Outpatient Prescriptions on File Prior to Visit  Medication Sig Dispense Refill  . allopurinol (ZYLOPRIM) 300 MG tablet Take 300 mg by mouth daily. For Gout.       . bisacodyl (DULCOLAX) 5 MG EC tablet Take 10 mg by mouth 2 (two) times daily as needed.        . Calcium Carbonate-Vitamin D (CALCIUM 500 + D PO) Take by mouth.        . cetirizine (ZYRTEC) 10 MG tablet Take 10 mg by mouth as needed.      . chlorpheniramine (CHLOR-TRIMETON) 4 MG tablet Take 4 mg by mouth daily.        . clonazePAM (KLONOPIN) 0.5 MG tablet Take 0.5 mg by mouth every 6 (six) hours as needed.        . clotrimazole (LOTRIMIN) 1 % cream Apply 1 application topically 2 (two) times daily. To toenails and bottom of feet       . folic acid (FOLVITE) 1 MG tablet Take 1 mg by mouth daily.        Marland Kitchen glucose blood test strip 1 each by Other route as needed. Precision Xtra Blood Glucose Strip. Use as instructed       . HYDROcodone-acetaminophen (NORCO) 5-325 MG per tablet Take 1 tablet by mouth every 6 (six) hours as needed.      . ibandronate (BONIVA) 150 MG tablet TAKE AS DIRECTED  3 tablet  4  . indomethacin (INDOCIN) 50 MG capsule Take 50 mg by mouth every 6 (six) hours as needed.        Marland Kitchen lisinopril-hydrochlorothiazide (PRINZIDE,ZESTORETIC) 20-25 MG per tablet Take 1 tablet by mouth daily.        . meclizine (ANTIVERT) 12.5 MG tablet Take 25 mg by mouth 3 (three) times daily as needed. Dizziness.      . metFORMIN (GLUCOPHAGE) 500 MG tablet Take 500 mg by mouth 2 (two) times daily with a meal.        . metoprolol succinate (TOPROL XL) 25 MG 24 hr tablet  Take 1 tablet (25 mg total) by mouth daily.  30 tablet  11  . multivitamin-lutein (OCUVITE-LUTEIN) CAPS Take 1 capsule by mouth as needed.      Marland Kitchen ofloxacin (FLOXIN) 0.3 % otic solution Place 10 drops into the right ear 2 (two) times daily.        Marland Kitchen omeprazole (PRILOSEC) 20 MG capsule Take 20 mg by mouth 2 (two) times daily.       Marland Kitchen pyridoxine (B-6) 50 MG tablet Take 50 mg by mouth daily.        . sildenafil (VIAGRA) 100 MG tablet Take 100 mg by mouth daily as needed.        . venlafaxine (EFFEXOR-XR) 75 MG 24 hr capsule Take 75 mg by mouth 2 (two) times daily.        . cyanocobalamin 1000 MCG tablet Take 100 mcg by mouth daily.        Marland Kitchen gabapentin (NEURONTIN) 300 MG capsule Take 300 mg by mouth 3 (three) times daily as needed. Foot pain.       Marland Kitchen  IRON PO Take by mouth once.       No current facility-administered medications on file prior to visit.      Review of Systems System review is negative for any constitutional, cardiac, pulmonary, GI or neuro symptoms or complaints other than as described in the HPI.     Objective:   Physical Exam Filed Vitals:   10/07/12 1319  BP: 140/88  Pulse: 70  Temp: 97.6 F (36.4 C)  Resp: 10   Gen'l- WNWD well dressed AA man in no distress Cor- RRR Pulm - normal respirations Ext - feet are normal in appearance with no deformity, no callus, no ulceration. Normal 2+ DP pulses.       Assessment & Plan:  Diabetic foot exam - normal feet. Does not meet medicare criteria for diabetic shoes or inserts.

## 2013-08-14 DIAGNOSIS — E119 Type 2 diabetes mellitus without complications: Secondary | ICD-10-CM | POA: Diagnosis not present

## 2013-08-14 DIAGNOSIS — I10 Essential (primary) hypertension: Secondary | ICD-10-CM | POA: Diagnosis not present

## 2013-08-14 DIAGNOSIS — R5383 Other fatigue: Secondary | ICD-10-CM | POA: Diagnosis not present

## 2013-08-14 DIAGNOSIS — E236 Other disorders of pituitary gland: Secondary | ICD-10-CM | POA: Diagnosis not present

## 2013-08-14 DIAGNOSIS — E782 Mixed hyperlipidemia: Secondary | ICD-10-CM | POA: Diagnosis not present

## 2013-08-14 DIAGNOSIS — R5381 Other malaise: Secondary | ICD-10-CM | POA: Diagnosis not present

## 2013-08-14 DIAGNOSIS — E785 Hyperlipidemia, unspecified: Secondary | ICD-10-CM | POA: Diagnosis not present

## 2013-08-14 DIAGNOSIS — S0993XA Unspecified injury of face, initial encounter: Secondary | ICD-10-CM | POA: Diagnosis not present

## 2013-08-14 DIAGNOSIS — E069 Thyroiditis, unspecified: Secondary | ICD-10-CM | POA: Diagnosis not present

## 2013-08-14 DIAGNOSIS — E039 Hypothyroidism, unspecified: Secondary | ICD-10-CM | POA: Diagnosis not present

## 2013-08-14 DIAGNOSIS — E279 Disorder of adrenal gland, unspecified: Secondary | ICD-10-CM | POA: Diagnosis not present

## 2013-08-14 DIAGNOSIS — S199XXA Unspecified injury of neck, initial encounter: Secondary | ICD-10-CM | POA: Diagnosis not present

## 2013-08-14 DIAGNOSIS — Z79899 Other long term (current) drug therapy: Secondary | ICD-10-CM | POA: Diagnosis not present

## 2013-08-18 DIAGNOSIS — E559 Vitamin D deficiency, unspecified: Secondary | ICD-10-CM | POA: Diagnosis not present

## 2013-08-18 DIAGNOSIS — S0993XA Unspecified injury of face, initial encounter: Secondary | ICD-10-CM | POA: Diagnosis not present

## 2013-08-18 DIAGNOSIS — E119 Type 2 diabetes mellitus without complications: Secondary | ICD-10-CM | POA: Diagnosis not present

## 2013-08-19 DIAGNOSIS — E119 Type 2 diabetes mellitus without complications: Secondary | ICD-10-CM | POA: Diagnosis not present

## 2013-08-19 DIAGNOSIS — I10 Essential (primary) hypertension: Secondary | ICD-10-CM | POA: Diagnosis not present

## 2013-08-24 DIAGNOSIS — E119 Type 2 diabetes mellitus without complications: Secondary | ICD-10-CM | POA: Diagnosis not present

## 2013-08-24 DIAGNOSIS — G589 Mononeuropathy, unspecified: Secondary | ICD-10-CM | POA: Diagnosis not present

## 2013-08-24 DIAGNOSIS — F4310 Post-traumatic stress disorder, unspecified: Secondary | ICD-10-CM | POA: Diagnosis not present

## 2013-08-24 DIAGNOSIS — I10 Essential (primary) hypertension: Secondary | ICD-10-CM | POA: Diagnosis not present

## 2013-09-07 DIAGNOSIS — I200 Unstable angina: Secondary | ICD-10-CM | POA: Diagnosis not present

## 2013-09-18 DIAGNOSIS — I200 Unstable angina: Secondary | ICD-10-CM | POA: Diagnosis not present

## 2013-10-05 DIAGNOSIS — I70219 Atherosclerosis of native arteries of extremities with intermittent claudication, unspecified extremity: Secondary | ICD-10-CM | POA: Diagnosis not present

## 2013-10-05 DIAGNOSIS — I77811 Abdominal aortic ectasia: Secondary | ICD-10-CM | POA: Diagnosis not present

## 2013-10-05 DIAGNOSIS — R0602 Shortness of breath: Secondary | ICD-10-CM | POA: Diagnosis not present

## 2013-10-05 DIAGNOSIS — I10 Essential (primary) hypertension: Secondary | ICD-10-CM | POA: Diagnosis not present

## 2013-10-05 DIAGNOSIS — E782 Mixed hyperlipidemia: Secondary | ICD-10-CM | POA: Diagnosis not present

## 2013-10-13 DIAGNOSIS — I70219 Atherosclerosis of native arteries of extremities with intermittent claudication, unspecified extremity: Secondary | ICD-10-CM | POA: Diagnosis not present

## 2013-10-13 DIAGNOSIS — I77811 Abdominal aortic ectasia: Secondary | ICD-10-CM | POA: Diagnosis not present

## 2013-10-27 DIAGNOSIS — H409 Unspecified glaucoma: Secondary | ICD-10-CM | POA: Diagnosis not present

## 2013-10-27 DIAGNOSIS — H4011X0 Primary open-angle glaucoma, stage unspecified: Secondary | ICD-10-CM | POA: Diagnosis not present

## 2013-10-27 DIAGNOSIS — H52229 Regular astigmatism, unspecified eye: Secondary | ICD-10-CM | POA: Diagnosis not present

## 2013-10-27 DIAGNOSIS — H5200 Hypermetropia, unspecified eye: Secondary | ICD-10-CM | POA: Diagnosis not present

## 2013-11-23 DIAGNOSIS — N529 Male erectile dysfunction, unspecified: Secondary | ICD-10-CM | POA: Diagnosis not present

## 2013-11-23 DIAGNOSIS — N401 Enlarged prostate with lower urinary tract symptoms: Secondary | ICD-10-CM | POA: Diagnosis not present

## 2013-11-23 DIAGNOSIS — N318 Other neuromuscular dysfunction of bladder: Secondary | ICD-10-CM | POA: Diagnosis not present

## 2013-11-23 DIAGNOSIS — N3941 Urge incontinence: Secondary | ICD-10-CM | POA: Diagnosis not present

## 2013-11-24 DIAGNOSIS — I70219 Atherosclerosis of native arteries of extremities with intermittent claudication, unspecified extremity: Secondary | ICD-10-CM | POA: Diagnosis not present

## 2014-01-19 DIAGNOSIS — M79609 Pain in unspecified limb: Secondary | ICD-10-CM | POA: Diagnosis not present

## 2014-01-19 DIAGNOSIS — M545 Low back pain, unspecified: Secondary | ICD-10-CM | POA: Diagnosis not present

## 2014-01-19 DIAGNOSIS — R209 Unspecified disturbances of skin sensation: Secondary | ICD-10-CM | POA: Diagnosis not present

## 2014-01-19 DIAGNOSIS — M5430 Sciatica, unspecified side: Secondary | ICD-10-CM | POA: Diagnosis not present

## 2014-04-22 DIAGNOSIS — M79609 Pain in unspecified limb: Secondary | ICD-10-CM | POA: Diagnosis not present

## 2014-04-22 DIAGNOSIS — I739 Peripheral vascular disease, unspecified: Secondary | ICD-10-CM | POA: Diagnosis not present

## 2014-04-22 DIAGNOSIS — G589 Mononeuropathy, unspecified: Secondary | ICD-10-CM | POA: Diagnosis not present

## 2014-05-03 DIAGNOSIS — H4011X0 Primary open-angle glaucoma, stage unspecified: Secondary | ICD-10-CM | POA: Diagnosis not present

## 2014-05-03 DIAGNOSIS — H01009 Unspecified blepharitis unspecified eye, unspecified eyelid: Secondary | ICD-10-CM | POA: Diagnosis not present

## 2014-05-03 DIAGNOSIS — H18419 Arcus senilis, unspecified eye: Secondary | ICD-10-CM | POA: Diagnosis not present

## 2014-05-03 DIAGNOSIS — H409 Unspecified glaucoma: Secondary | ICD-10-CM | POA: Diagnosis not present

## 2014-05-03 DIAGNOSIS — H11159 Pinguecula, unspecified eye: Secondary | ICD-10-CM | POA: Diagnosis not present

## 2014-05-19 DIAGNOSIS — N401 Enlarged prostate with lower urinary tract symptoms: Secondary | ICD-10-CM | POA: Diagnosis not present

## 2014-05-19 DIAGNOSIS — C672 Malignant neoplasm of lateral wall of bladder: Secondary | ICD-10-CM | POA: Diagnosis not present

## 2014-05-19 DIAGNOSIS — R310 Gross hematuria: Secondary | ICD-10-CM | POA: Diagnosis not present

## 2014-05-19 DIAGNOSIS — N3289 Other specified disorders of bladder: Secondary | ICD-10-CM | POA: Diagnosis not present

## 2014-05-19 DIAGNOSIS — N3941 Urge incontinence: Secondary | ICD-10-CM | POA: Diagnosis not present

## 2014-05-19 DIAGNOSIS — N281 Cyst of kidney, acquired: Secondary | ICD-10-CM | POA: Diagnosis not present

## 2014-05-20 ENCOUNTER — Other Ambulatory Visit: Payer: Self-pay | Admitting: Urology

## 2014-05-25 ENCOUNTER — Encounter: Payer: Self-pay | Admitting: Internal Medicine

## 2014-05-25 DIAGNOSIS — J439 Emphysema, unspecified: Secondary | ICD-10-CM | POA: Diagnosis not present

## 2014-05-25 DIAGNOSIS — R918 Other nonspecific abnormal finding of lung field: Secondary | ICD-10-CM | POA: Diagnosis not present

## 2014-05-25 DIAGNOSIS — R942 Abnormal results of pulmonary function studies: Secondary | ICD-10-CM | POA: Diagnosis not present

## 2014-05-25 DIAGNOSIS — C679 Malignant neoplasm of bladder, unspecified: Secondary | ICD-10-CM | POA: Diagnosis not present

## 2014-05-26 ENCOUNTER — Encounter (HOSPITAL_COMMUNITY): Payer: Self-pay | Admitting: Pharmacy Technician

## 2014-05-27 DIAGNOSIS — G8929 Other chronic pain: Secondary | ICD-10-CM | POA: Diagnosis not present

## 2014-05-27 DIAGNOSIS — G589 Mononeuropathy, unspecified: Secondary | ICD-10-CM | POA: Diagnosis not present

## 2014-05-27 DIAGNOSIS — F4310 Post-traumatic stress disorder, unspecified: Secondary | ICD-10-CM | POA: Diagnosis not present

## 2014-05-28 ENCOUNTER — Encounter (INDEPENDENT_AMBULATORY_CARE_PROVIDER_SITE_OTHER): Payer: Self-pay

## 2014-05-28 ENCOUNTER — Encounter (HOSPITAL_COMMUNITY)
Admission: RE | Admit: 2014-05-28 | Discharge: 2014-05-28 | Disposition: A | Discharge status: Home / Self Care | Payer: Medicare Other | Source: Ambulatory Visit | Attending: Urology | Admitting: Urology

## 2014-05-28 ENCOUNTER — Encounter (HOSPITAL_COMMUNITY): Payer: Self-pay

## 2014-05-28 ENCOUNTER — Encounter (HOSPITAL_COMMUNITY): Payer: Self-pay | Admitting: Pharmacy Technician

## 2014-05-28 DIAGNOSIS — Z01818 Encounter for other preprocedural examination: Secondary | ICD-10-CM | POA: Diagnosis present

## 2014-05-28 DIAGNOSIS — I10 Essential (primary) hypertension: Secondary | ICD-10-CM | POA: Insufficient documentation

## 2014-05-28 HISTORY — DX: Neoplasm of unspecified behavior of bladder: D49.4

## 2014-05-28 HISTORY — DX: Other ventricular tachycardia: I47.29

## 2014-05-28 HISTORY — DX: Restless legs syndrome: G25.81

## 2014-05-28 HISTORY — DX: Other nonspecific abnormal finding of lung field: R91.8

## 2014-05-28 HISTORY — DX: Ventricular tachycardia: I47.2

## 2014-05-28 LAB — CBC
HEMATOCRIT: 44.3 % (ref 39.0–52.0)
Hemoglobin: 14.7 g/dL (ref 13.0–17.0)
MCH: 29.1 pg (ref 26.0–34.0)
MCHC: 33.2 g/dL (ref 30.0–36.0)
MCV: 87.5 fL (ref 78.0–100.0)
Platelets: 211 10*3/uL (ref 150–400)
RBC: 5.06 MIL/uL (ref 4.22–5.81)
RDW: 13.6 % (ref 11.5–15.5)
WBC: 3.8 10*3/uL — AB (ref 4.0–10.5)

## 2014-05-28 LAB — BASIC METABOLIC PANEL
ANION GAP: 12 (ref 5–15)
BUN: 21 mg/dL (ref 6–23)
CHLORIDE: 102 meq/L (ref 96–112)
CO2: 29 mEq/L (ref 19–32)
CREATININE: 1.04 mg/dL (ref 0.50–1.35)
Calcium: 10.1 mg/dL (ref 8.4–10.5)
GFR calc Af Amer: 77 mL/min — ABNORMAL LOW (ref >90)
GFR calc non Af Amer: 66 mL/min — ABNORMAL LOW (ref >90)
Glucose, Bld: 80 mg/dL (ref 70–99)
Potassium: 4.3 mEq/L (ref 3.7–5.3)
Sodium: 143 mEq/L (ref 137–147)

## 2014-05-28 NOTE — Pre-Procedure Instructions (Signed)
EKG WAS DONE TODAY PREOP AT [facility]. CHEST CT REPORT ON CHART FROM [facility] - IT WAS DONE 05/25/14.

## 2014-05-28 NOTE — Patient Instructions (Addendum)
YOUR SURGERY IS SCHEDULED AT [facility]  ON:  Thursday  10/22  REPORT TO  SHORT STAY CENTER AT:  5:45 AM      DO NOT EAT OR DRINK ANYTHING AFTER MIDNIGHT THE NIGHT BEFORE YOUR SURGERY.  YOU MAY BRUSH YOUR TEETH, RINSE OUT YOUR MOUTH--BUT NO WATER, NO FOOD, NO CHEWING GUM, NO MINTS, NO CANDIES, NO CHEWING TOBACCO.  PLEASE TAKE THE FOLLOWING MEDICATIONS THE AM OF YOUR SURGERY WITH A FEW SIPS OF WATER:  ATENOLOL, CERTIRIZINE ( ZYRTEC ), GABAPENTIN ( NEURONTIN ), OMEPRAZOLE ( PRILOSEC ), VENLAFAXINE ( EFFEXOR ).     IF YOU ARE DIABETIC:  DO NOT TAKE ANY DIABETIC MEDICATIONS THE AM OF YOUR SURGERY.     DO NOT BRING VALUABLES, MONEY, CREDIT CARDS.  DO NOT WEAR JEWELRY, MAKE-UP, NAIL POLISH AND NO METAL PINS OR CLIPS IN YOUR HAIR. CONTACT LENS, DENTURES / PARTIALS, GLASSES SHOULD NOT BE WORN TO SURGERY AND IN MOST CASES-HEARING AIDS WILL NEED TO BE REMOVED.  BRING YOUR GLASSES CASE, ANY EQUIPMENT NEEDED FOR YOUR CONTACT LENS. FOR PATIENTS ADMITTED TO THE HOSPITAL--CHECK OUT TIME THE DAY OF DISCHARGE IS 11:00 AM.  ALL INPATIENT ROOMS ARE PRIVATE - WITH BATHROOM, TELEPHONE, TELEVISION AND WIFI INTERNET.    PLEASE BE AWARE THAT YOU MAY NEED ADDITIONAL BLOOD DRAWN DAY OF YOUR SURGERY  _______________________________________________________________________   [facility] - Preparing for Surgery Before surgery, you can play an important role.  Because skin is not sterile, your skin needs to be as free of germs as possible.  You can reduce the number of germs on your skin by washing with CHG (chlorahexidine gluconate) soap before surgery.  CHG is an antiseptic cleaner which kills germs and bonds with the skin to continue killing germs even after washing. Please DO NOT use if you have an allergy to CHG or antibacterial soaps.  If your skin becomes reddened/irritated stop using the CHG and inform your nurse when you arrive at Short Stay. Do not shave (including legs and underarms) for  at least 48 hours prior to the first CHG shower.  You may shave your face/neck. Please follow these instructions carefully:  1.  Shower with CHG Soap the night before surgery and the  morning of Surgery.  2.  If you choose to wash your hair, wash your hair first as usual with your  normal  shampoo.  3.  After you shampoo, rinse your hair and body thoroughly to remove the  shampoo.                           4.  Use CHG as you would any other liquid soap.  You can apply chg directly  to the skin and wash                       Gently with a scrungie or clean washcloth.  5.  Apply the CHG Soap to your body ONLY FROM THE NECK DOWN.   Do not use on face/ open                           Wound or open sores. Avoid contact with eyes, ears mouth and genitals (private parts).                       Wash face,  Genitals (private parts) with your normal soap.  6.  Wash thoroughly, paying special attention to the area where your surgery  will be performed.  7.  Thoroughly rinse your body with warm water from the neck down.  8.  DO NOT shower/wash with your normal soap after using and rinsing off  the CHG Soap.                9.  Pat yourself dry with a clean towel.            10.  Wear clean pajamas.            11.  Place clean sheets on your bed the night of your first shower and do not  sleep with pets. Day of Surgery : Do not apply any lotions/deodorants the morning of surgery.  Please wear clean clothes to the hospital/surgery center.  FAILURE TO FOLLOW THESE INSTRUCTIONS MAY RESULT IN THE CANCELLATION OF YOUR SURGERY PATIENT SIGNATURE_________________________________  NURSE SIGNATURE__________________________________  ________________________________________________________________________

## 2014-06-02 DIAGNOSIS — H18413 Arcus senilis, bilateral: Secondary | ICD-10-CM | POA: Diagnosis not present

## 2014-06-02 DIAGNOSIS — Z9849 Cataract extraction status, unspecified eye: Secondary | ICD-10-CM | POA: Diagnosis not present

## 2014-06-02 DIAGNOSIS — H11153 Pinguecula, bilateral: Secondary | ICD-10-CM | POA: Diagnosis not present

## 2014-06-02 DIAGNOSIS — H401232 Low-tension glaucoma, bilateral, moderate stage: Secondary | ICD-10-CM | POA: Diagnosis not present

## 2014-06-02 DIAGNOSIS — E119 Type 2 diabetes mellitus without complications: Secondary | ICD-10-CM | POA: Diagnosis not present

## 2014-06-02 DIAGNOSIS — H4011X2 Primary open-angle glaucoma, moderate stage: Secondary | ICD-10-CM | POA: Diagnosis not present

## 2014-06-02 DIAGNOSIS — Z961 Presence of intraocular lens: Secondary | ICD-10-CM | POA: Diagnosis not present

## 2014-06-02 NOTE — Anesthesia Preprocedure Evaluation (Addendum)
Anesthesia Evaluation  Patient identified by MRN, date of birth, ID band Patient awake    Reviewed: Allergy & Precautions, H&P , NPO status , Patient's Chart, lab work & pertinent test results  Airway Mallampati: II TM Distance: >3 FB Neck ROM: Full    Dental no notable dental hx.    Pulmonary former smoker,  breath sounds clear to auscultation  Pulmonary exam normal       Cardiovascular hypertension, Pt. on medications and Pt. on home beta blockers negative cardio ROS  + dysrhythmias Ventricular Tachycardia Rhythm:Regular Rate:Normal     Neuro/Psych PSYCHIATRIC DISORDERS Anxiety Depression  Neuromuscular disease    GI/Hepatic negative GI ROS, Neg liver ROS, GERD-  Medicated and Controlled,  Endo/Other  negative endocrine ROSdiabetes  Renal/GU negative Renal ROS  negative genitourinary   Musculoskeletal  (+) Arthritis -, Osteoarthritis,    Abdominal   Peds negative pediatric ROS (+)  Hematology negative hematology ROS (+)   Anesthesia Other Findings   Reproductive/Obstetrics negative OB ROS                          Anesthesia Physical Anesthesia Plan  ASA: III  Anesthesia Plan: General   Post-op Pain Management:    Induction: Intravenous  Airway Management Planned: LMA  Additional Equipment:   Intra-op Plan:   Post-operative Plan: Extubation in OR  Informed Consent: I have reviewed the patients History and Physical, chart, labs and discussed the procedure including the risks, benefits and alternatives for the proposed anesthesia with the patient or authorized representative who has indicated his/her understanding and acceptance.   Dental advisory given  Plan Discussed with: CRNA  Anesthesia Plan Comments:         Anesthesia Quick Evaluation

## 2014-06-02 NOTE — H&P (Signed)
Active Problems Problems  1. Benign prostatic hyperplasia with urinary obstruction (N40.1) 2. Constipation (K59.00) 3. 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 Erectile dysfunction due to arterial insufficiency (N52.01) 5. Gross hematuria (R31.0) 6. Malignant neoplasm of lateral wall of bladder (C67.2) 7. Nocturia (R35.1) 8. Post-traumatic stress disorder (F43.10) 9. Renal cyst, acquired (N28.1) 10. Urge incontinence of urine (N39.41)  History of Present Illness [name] returns today in f/u. He had the onset of gross hematuria 2 weeks ago. He has had no new voiding complaints or pain with the bleeding. It was heavy at first but it has improved with fluid intake.  He has a history of BPH with BOO and a small capacity unstable bladder with UUI.  He is also on finasteride and terazosin.  He has ED and was on Viagra but hasn't been active lately. His last PSA was 1.35 from Dr. Kim.   Past Medical History Problems  1. History of Anxiety (F41.9) 2. History of Arthritis 3. History of Colon Cancer 4. History of depression (Z86.59) 5. History of diabetes mellitus (Z86.39) 6. History of hypertension (Z86.79)  Surgical History Problems  1. History of Arthroscopy Knee 2. History of Ear Surgery 3. History of Hand Repair 4. History of Knee Replacement 5. History of Partial Colectomy  Current Meds 1. Acetaminophen TABS;  Therapy: (Recorded:28Apr2014) to Recorded 2. AmLODIPine Besylate 5 MG Oral Tablet;  Therapy: (Recorded:28Apr2014) to Recorded 3. Aspirin 81 MG Oral Tablet;  Therapy: (Recorded:28Apr2014) to Recorded 4. Atenolol 25 MG Oral Tablet;  Therapy: (Recorded:28Apr2014) to Recorded 5. B-12 50 MCG Oral Tablet;  Therapy: 07Oct2015 to Recorded 6. Calcium + D TABS;  Therapy: (Recorded:02Feb2009) to Recorded 7. Finasteride 5 MG Oral Tablet; Take 1 tablet daily; Last Rx:22Dec2014 Ordered 8. Iron TABS;  Therapy: (Recorded:18Oct2013) to Recorded 9. MetFORMIN HCl - 500 MG Oral Tablet;   Therapy: (Recorded:06Mar2012) to Recorded 10. Omeprazole 20 MG Oral Tablet Delayed Release;   Therapy: (Recorded:02Feb2009) to Recorded 11. Oxybutynin Chloride ER 10 MG Oral Tablet Extended Release 24 Hour; take 1 tablet by   mouth once daily;   Therapy: 11Aug2014 to (Evaluate:17Dec2015); Last Rx:22Dec2014 Ordered 12. Terazosin HCl - 2 MG Oral Capsule; TAKE 1 CAPSULE AT BEDTIME NIGHTLY; Last   Rx:22Dec2014 Ordered 13. Venlafaxine HCl ER TB24;   Therapy: (Recorded:18Oct2013) to Recorded 14. Viagra 100 MG Oral Tablet; TAKE ONE TABLET BY MOUTH ONE HOUR BEFORE   NEEDED, DO NOT TAKE MORE THANONE TABLET IN 24 HOURS;   Therapy: 21Jul2008 to (Evaluate:21Apr2015)  Requested for: 22Dec2014; Last   Rx:22Dec2014 Ordered 15. Vitamin D TABS;   Therapy: (Recorded:18Oct2013) to Recorded  Allergies Medication  1. No Known Drug Allergies  Family History Problems  1. Family history of Cancer : Father 2. Family history of Cancer : Mother 3. Family history of Cancer : Sister 4. Family history of Prostate Cancer : Brother  Social History Problems  1. Family history of Death In The Family Father   age 73 old age 2. Family history of Death In The Family Mother   age 74 old age 3. Marital History - Currently Married 4. Occupation:   retired [employer] 5. History of Tobacco Use   1 pack, 35 years, quit 35 years ago  Review of Systems Genitourinary, constitutional, skin, eye, otolaryngeal, hematologic/lymphatic, cardiovascular, pulmonary, endocrine, musculoskeletal, gastrointestinal, neurological and psychiatric system(s) were reviewed and pertinent findings if present are noted.  Genitourinary: incontinence and hematuria, but no dysuria.  Gastrointestinal: nausea, heartburn and constipation, but no flank pain   The patient presents   with complaints of vomiting (that began after a colonoscopy).  Constitutional: recent 10 lb weight loss, but no fever.    Vitals Vital Signs [Data Includes: Last 1  Day]  Recorded: 07Oct2015 09:02AM  Blood Pressure: 150 / 97 Temperature: 96.9 F Heart Rate: 74  Physical Exam Constitutional: Well nourished and well developed . No acute distress.  Pulmonary: No respiratory distress and normal respiratory rhythm and effort.  Cardiovascular: Heart rate and rhythm are normal . No peripheral edema.  Abdomen: The abdomen is rounded. No masses are palpated. The abdomen is not firm, no rebound and no guarding. Mild. Tenderness is present diffusely throughout the abdomen. No CVA tenderness. No hernias are palpable. No hepatosplenomegaly noted.    Results/Data Urine [Data Includes: Last 1 Day]   07Oct2015  COLOR AMBER   APPEARANCE CLOUDY   SPECIFIC GRAVITY 1.015   pH 7.0   GLUCOSE NEG mg/dL  BILIRUBIN NEG   KETONE NEG mg/dL  BLOOD LARGE   PROTEIN NEG mg/dL  UROBILINOGEN 1 mg/dL  NITRITE NEG   LEUKOCYTE ESTERASE NEG   SQUAMOUS EPITHELIAL/HPF FEW   WBC 0-2 WBC/hpf  RBC 21-50 RBC/hpf  BACTERIA NONE SEEN   CRYSTALS NONE SEEN   CASTS NONE SEEN    The following images/tracing/specimen were independently visualized:  CT films reviewed. Report is pendiing. He has bilateral renal cysts and a 2-3cm bladder tumor in the right bladder.  The following clinical lab reports were reviewed:  UA reviewed. Selected Results  BUN & CREATININE 07Oct2015 09:38AM [name]  SPECIMEN TYPE: BLOOD   Test Name Result Flag Reference  CREATININE 1.10 mg/dL  0.50-1.50  BUN 16 mg/dL  6-30  Est GFR, African American 73 mL/min    Est GFR, NonAfrican American 64 mL/min    PERFORMED AT:        [facility]                      [address]                      [place], [place] [address]   THE ESTIMATED GFR IS A CALCULATION VALID FOR ADULTS (>=55 YEARS OLD) THAT USES THE CKD-EPI ALGORITHM TO ADJUST FOR AGE AND SEX. IT IS   NOT TO BE USED FOR CHILDREN, PREGNANT WOMEN, HOSPITALIZED PATIENTS,    PATIENTS ON DIALYSIS, OR WITH RAPIDLY CHANGING KIDNEY  FUNCTION. ACCORDING TO THE NKDEP, EGFR >89 IS NORMAL, 60-89 SHOWS MILD IMPAIRMENT, 30-59 SHOWS MODERATE IMPAIRMENT, 15-29 SHOWS SEVERE IMPAIRMENT AND <15 IS ESRD.   Procedure  Procedure: Cystoscopy   Indication: Hematuria. History of Urothelial Carcinoma.  Informed Consent: Risks, benefits, and potential adverse events were discussed and informed consent was obtained from the patient.  Prep: The patient was prepped with betadine.  Antibiotic prophylaxis: Ciprofloxacin.  Procedure Note:  Urethral meatus:. No abnormalities.  Anterior urethra: No abnormalities.  Prostatic urethra: No abnormalities . Estimated length was 3 cm. There was visual obstruction of the prostatic urethra. The lateral and median prostatic lobes were enlarged. An enlarged intravesical median lobe was visualized.  Bladder: Visulization was clear. The ureteral orifices were in the normal anatomic position bilaterally and had clear efflux of urine. The mucosa was smooth without abnormalities. Examination of the bladder demonstrated mild trabeculation. A solitary tumor was visualized in the bladder. A papillary tumor was seen in the bladder measuring approximately 2 cm in size. This tumor was located on the right side of the bladder.  The patient tolerated the procedure well.  Complications: None.    Assessment Assessed  1. Gross hematuria (R31.0) 2. Benign prostatic hyperplasia with urinary obstruction (N40.1) 3. Urge incontinence of urine (N39.41) 4. Malignant neoplasm of lateral wall of bladder (C67.2) 5. Renal cyst, acquired (N28.1)  He has a 2 cm papillary tumor of the right lateral wall.   He ahs BPH with BOO.  He has bilateral renal cysts with a large cyst on the right.   Plan Gross hematuria  1. AU CT-HEMATURIA PROTOCOL; Status:In Progress - Specimen/Data Collected;   Done:  07Oct2015 12:00AM 2. BUN & CREATININE; Status:Resulted - Requires Verification;   Done: 07Oct2015 09:38AM 3. Cysto; Status:Hold For  - Appointment,Date of Service; Requested for:07Oct2015;  4. VENIPUNCTURE; Status:Complete;   Done: [id number] Health Maintenance  5. UA With REFLEX; [Do Not Release]; Status:Resulted - Requires Verification;   Done:  07Oct2015 08:49AM Malignant neoplasm of lateral wall of bladder  6. URINE CYTOLOGY; Status:Hold For - Specimen/Data Collection,Appointment;  Requested FUW:[id number];  7. Follow-up Schedule Surgery Office  Follow-up  Status: Hold For - Appointment   Requested for: 07Oct2015  I am going to get him set up for a TURBT with [place] instillation. I have reviewed the risks of bleeding, infection, bladder injury, strictures, chemical cystitis, thrombotic events and anesthetic complications.   Discussion/Summary CC: Dr. Jani Gravel.    CC: Dr. Jani Gravel.

## 2014-06-03 ENCOUNTER — Ambulatory Visit (HOSPITAL_COMMUNITY): Payer: Medicare Other | Admitting: Anesthesiology

## 2014-06-03 ENCOUNTER — Observation Stay (HOSPITAL_COMMUNITY)
Admission: RE | Admit: 2014-06-03 | Discharge: 2014-06-04 | Disposition: A | Discharge status: Home / Self Care | Payer: Medicare Other | Source: Ambulatory Visit | Attending: Urology | Admitting: Urology

## 2014-06-03 ENCOUNTER — Encounter (HOSPITAL_COMMUNITY): Payer: Self-pay

## 2014-06-03 ENCOUNTER — Encounter (HOSPITAL_COMMUNITY): Payer: Medicare Other | Admitting: Anesthesiology

## 2014-06-03 ENCOUNTER — Encounter (HOSPITAL_COMMUNITY)
Admission: RE | Disposition: A | Discharge status: Home / Self Care | Payer: Self-pay | Source: Ambulatory Visit | Attending: Urology

## 2014-06-03 DIAGNOSIS — I10 Essential (primary) hypertension: Secondary | ICD-10-CM | POA: Diagnosis not present

## 2014-06-03 DIAGNOSIS — M1990 Unspecified osteoarthritis, unspecified site: Secondary | ICD-10-CM | POA: Diagnosis not present

## 2014-06-03 DIAGNOSIS — C675 Malignant neoplasm of bladder neck: Principal | ICD-10-CM | POA: Insufficient documentation

## 2014-06-03 DIAGNOSIS — H409 Unspecified glaucoma: Secondary | ICD-10-CM | POA: Insufficient documentation

## 2014-06-03 DIAGNOSIS — H918X3 Other specified hearing loss, bilateral: Secondary | ICD-10-CM | POA: Insufficient documentation

## 2014-06-03 DIAGNOSIS — F329 Major depressive disorder, single episode, unspecified: Secondary | ICD-10-CM | POA: Diagnosis not present

## 2014-06-03 DIAGNOSIS — G2581 Restless legs syndrome: Secondary | ICD-10-CM | POA: Insufficient documentation

## 2014-06-03 DIAGNOSIS — N401 Enlarged prostate with lower urinary tract symptoms: Secondary | ICD-10-CM | POA: Diagnosis not present

## 2014-06-03 DIAGNOSIS — Z85038 Personal history of other malignant neoplasm of large intestine: Secondary | ICD-10-CM | POA: Diagnosis not present

## 2014-06-03 DIAGNOSIS — R918 Other nonspecific abnormal finding of lung field: Secondary | ICD-10-CM | POA: Diagnosis not present

## 2014-06-03 DIAGNOSIS — K219 Gastro-esophageal reflux disease without esophagitis: Secondary | ICD-10-CM | POA: Diagnosis not present

## 2014-06-03 DIAGNOSIS — G629 Polyneuropathy, unspecified: Secondary | ICD-10-CM | POA: Insufficient documentation

## 2014-06-03 DIAGNOSIS — N3941 Urge incontinence: Secondary | ICD-10-CM | POA: Diagnosis not present

## 2014-06-03 DIAGNOSIS — C679 Malignant neoplasm of bladder, unspecified: Secondary | ICD-10-CM | POA: Diagnosis present

## 2014-06-03 DIAGNOSIS — D494 Neoplasm of unspecified behavior of bladder: Secondary | ICD-10-CM | POA: Diagnosis not present

## 2014-06-03 DIAGNOSIS — M810 Age-related osteoporosis without current pathological fracture: Secondary | ICD-10-CM | POA: Diagnosis not present

## 2014-06-03 DIAGNOSIS — E119 Type 2 diabetes mellitus without complications: Secondary | ICD-10-CM | POA: Diagnosis not present

## 2014-06-03 DIAGNOSIS — F4310 Post-traumatic stress disorder, unspecified: Secondary | ICD-10-CM | POA: Diagnosis not present

## 2014-06-03 DIAGNOSIS — C672 Malignant neoplasm of lateral wall of bladder: Secondary | ICD-10-CM | POA: Diagnosis not present

## 2014-06-03 HISTORY — PX: TRANSURETHRAL RESECTION OF BLADDER TUMOR WITH GYRUS (TURBT-GYRUS): SHX6458

## 2014-06-03 HISTORY — PX: CYSTO: SHX6284

## 2014-06-03 LAB — GLUCOSE, CAPILLARY
GLUCOSE-CAPILLARY: 112 mg/dL — AB (ref 70–99)
Glucose-Capillary: 88 mg/dL (ref 70–99)
Glucose-Capillary: 117 mg/dL — ABNORMAL HIGH (ref 70–99)
Glucose-Capillary: 100 mg/dL — ABNORMAL HIGH (ref 70–99)
Glucose-Capillary: 115 mg/dL — ABNORMAL HIGH (ref 70–99)

## 2014-06-03 SURGERY — TRANSURETHRAL RESECTION OF BLADDER TUMOR WITH GYRUS (TURBT-GYRUS)
Anesthesia: General

## 2014-06-03 MED ORDER — CLONAZEPAM 0.5 MG PO TABS: 0.5000 mg | ORAL_TABLET | Freq: Four times a day (QID) | ORAL | Status: DC | PRN

## 2014-06-03 MED ORDER — HYDROCHLOROTHIAZIDE 12.5 MG PO CAPS
12.5000 mg | ORAL_CAPSULE | Freq: Every day | ORAL | Status: DC
  Administered 2014-06-04: 12.5 mg via ORAL
  Filled 2014-06-03 (×2): qty 1

## 2014-06-03 MED ORDER — SODIUM CHLORIDE 0.9 % IR SOLN
Status: DC | PRN
  Administered 2014-06-03: 6000 mL via INTRAVESICAL

## 2014-06-03 MED ORDER — LIDOCAINE HCL (CARDIAC) 20 MG/ML IV SOLN
INTRAVENOUS | Status: DC | PRN
  Administered 2014-06-03: 80 mg via INTRAVENOUS

## 2014-06-03 MED ORDER — HYDROCODONE-ACETAMINOPHEN 5-325 MG PO TABS: 1.0000 | ORAL_TABLET | Freq: Four times a day (QID) | ORAL | Status: DC | PRN

## 2014-06-03 MED ORDER — FENTANYL CITRATE 0.05 MG/ML IJ SOLN
INTRAMUSCULAR | Status: AC
  Filled 2014-06-03: qty 5

## 2014-06-03 MED ORDER — ATENOLOL 25 MG PO TABS
25.0000 mg | ORAL_TABLET | Freq: Every morning | ORAL | Status: DC
  Administered 2014-06-04: 25 mg via ORAL
  Filled 2014-06-03: qty 1

## 2014-06-03 MED ORDER — CIPROFLOXACIN IN D5W 400 MG/200ML IV SOLN
INTRAVENOUS | Status: AC
  Filled 2014-06-03: qty 200

## 2014-06-03 MED ORDER — CIPROFLOXACIN HCL 250 MG PO TABS
250.0000 mg | ORAL_TABLET | Freq: Two times a day (BID) | ORAL | Status: DC
  Administered 2014-06-03 – 2014-06-04 (×2): 250 mg via ORAL
  Filled 2014-06-03 (×2): qty 1

## 2014-06-03 MED ORDER — ACETAMINOPHEN 325 MG PO TABS
650.0000 mg | ORAL_TABLET | ORAL | Status: DC | PRN
  Administered 2014-06-03: 650 mg via ORAL
  Filled 2014-06-03: qty 2

## 2014-06-03 MED ORDER — EPHEDRINE SULFATE 50 MG/ML IJ SOLN
INTRAMUSCULAR | Status: AC
  Filled 2014-06-03: qty 1

## 2014-06-03 MED ORDER — NEOSTIGMINE METHYLSULFATE 10 MG/10ML IV SOLN
INTRAVENOUS | Status: DC | PRN
  Administered 2014-06-03: 5 mg via INTRAVENOUS

## 2014-06-03 MED ORDER — PHENYLEPHRINE HCL 10 MG/ML IJ SOLN
INTRAMUSCULAR | Status: DC | PRN
  Administered 2014-06-03 (×2): 40 ug via INTRAVENOUS

## 2014-06-03 MED ORDER — BISACODYL 10 MG RE SUPP
10.0000 mg | Freq: Every day | RECTAL | Status: DC | PRN
Start: 2014-06-03 — End: 2014-06-04

## 2014-06-03 MED ORDER — ZOLPIDEM TARTRATE 5 MG PO TABS: 5.0000 mg | ORAL_TABLET | Freq: Every evening | ORAL | Status: DC | PRN

## 2014-06-03 MED ORDER — GLYCOPYRROLATE 0.2 MG/ML IJ SOLN
INTRAMUSCULAR | Status: DC | PRN
  Administered 2014-06-03: .8 mg via INTRAVENOUS

## 2014-06-03 MED ORDER — POTASSIUM CHLORIDE IN NACL 20-0.45 MEQ/L-% IV SOLN
INTRAVENOUS | Status: DC
  Administered 2014-06-03 (×2): via INTRAVENOUS
  Filled 2014-06-03 (×4): qty 1000

## 2014-06-03 MED ORDER — FENTANYL CITRATE 0.05 MG/ML IJ SOLN
INTRAMUSCULAR | Status: DC | PRN
  Administered 2014-06-03 (×2): 50 ug via INTRAVENOUS

## 2014-06-03 MED ORDER — ATORVASTATIN CALCIUM 10 MG PO TABS
10.0000 mg | ORAL_TABLET | Freq: Every day | ORAL | Status: DC
  Administered 2014-06-03: 10 mg via ORAL
  Filled 2014-06-03: qty 1

## 2014-06-03 MED ORDER — SODIUM CHLORIDE 0.9 % IJ SOLN
INTRAMUSCULAR | Status: AC
  Filled 2014-06-03: qty 10

## 2014-06-03 MED ORDER — LISINOPRIL-HYDROCHLOROTHIAZIDE 20-12.5 MG PO TABS: 1.0000 | ORAL_TABLET | Freq: Every morning | ORAL | Status: DC

## 2014-06-03 MED ORDER — CISATRACURIUM BESYLATE 20 MG/10ML IV SOLN
INTRAVENOUS | Status: AC
  Filled 2014-06-03: qty 10

## 2014-06-03 MED ORDER — GUAIFENESIN 200 MG PO TABS
400.0000 mg | ORAL_TABLET | ORAL | Status: DC | PRN
  Filled 2014-06-03: qty 2

## 2014-06-03 MED ORDER — ONDANSETRON HCL 4 MG/2ML IJ SOLN
INTRAMUSCULAR | Status: DC | PRN
  Administered 2014-06-03: 4 mg via INTRAVENOUS

## 2014-06-03 MED ORDER — LORATADINE 10 MG PO TABS
10.0000 mg | ORAL_TABLET | Freq: Every day | ORAL | Status: DC
  Administered 2014-06-03 – 2014-06-04 (×2): 10 mg via ORAL
  Filled 2014-06-03 (×2): qty 1

## 2014-06-03 MED ORDER — MITOMYCIN CHEMO FOR BLADDER INSTILLATION 40 MG
40.0000 mg | Freq: Once | INTRAVENOUS | Status: DC
  Administered 2014-06-03: 40 mg via INTRAVESICAL
  Filled 2014-06-03: qty 40

## 2014-06-03 MED ORDER — ONDANSETRON HCL 4 MG/2ML IJ SOLN
4.0000 mg | INTRAMUSCULAR | Status: DC | PRN
  Administered 2014-06-03: 4 mg via INTRAVENOUS
  Filled 2014-06-03: qty 2

## 2014-06-03 MED ORDER — SUCCINYLCHOLINE CHLORIDE 20 MG/ML IJ SOLN
INTRAMUSCULAR | Status: DC | PRN
  Administered 2014-06-03: 100 mg via INTRAVENOUS

## 2014-06-03 MED ORDER — HYDROCODONE-ACETAMINOPHEN 5-325 MG PO TABS
1.0000 | ORAL_TABLET | ORAL | Status: DC | PRN
  Administered 2014-06-03 – 2014-06-04 (×3): 2 via ORAL
  Filled 2014-06-03 (×3): qty 2

## 2014-06-03 MED ORDER — CIPROFLOXACIN IN D5W 400 MG/200ML IV SOLN
400.0000 mg | INTRAVENOUS | Status: AC
  Administered 2014-06-03: 400 mg via INTRAVENOUS

## 2014-06-03 MED ORDER — ATROPINE SULFATE 0.4 MG/ML IJ SOLN
INTRAMUSCULAR | Status: AC
  Filled 2014-06-03: qty 2

## 2014-06-03 MED ORDER — TERAZOSIN HCL 2 MG PO CAPS
2.0000 mg | ORAL_CAPSULE | Freq: Every day | ORAL | Status: DC
Start: 2014-06-03 — End: 2014-06-04
  Administered 2014-06-03: 2 mg via ORAL
  Filled 2014-06-03: qty 1

## 2014-06-03 MED ORDER — ONDANSETRON HCL 4 MG/2ML IJ SOLN
INTRAMUSCULAR | Status: AC
  Filled 2014-06-03: qty 2

## 2014-06-03 MED ORDER — METFORMIN HCL 500 MG PO TABS
500.0000 mg | ORAL_TABLET | Freq: Two times a day (BID) | ORAL | Status: DC
  Administered 2014-06-03 – 2014-06-04 (×2): 500 mg via ORAL
  Filled 2014-06-03 (×3): qty 1

## 2014-06-03 MED ORDER — PHENYLEPHRINE 40 MCG/ML (10ML) SYRINGE FOR IV PUSH (FOR BLOOD PRESSURE SUPPORT)
PREFILLED_SYRINGE | INTRAVENOUS | Status: AC
  Filled 2014-06-03: qty 10

## 2014-06-03 MED ORDER — LISINOPRIL 20 MG PO TABS
20.0000 mg | ORAL_TABLET | Freq: Every day | ORAL | Status: DC
  Administered 2014-06-04: 20 mg via ORAL
  Filled 2014-06-03 (×2): qty 1

## 2014-06-03 MED ORDER — VENLAFAXINE HCL ER 75 MG PO CP24
75.0000 mg | ORAL_CAPSULE | Freq: Two times a day (BID) | ORAL | Status: DC
Start: 2014-06-03 — End: 2014-06-04
  Administered 2014-06-03 – 2014-06-04 (×3): 75 mg via ORAL
  Filled 2014-06-03 (×3): qty 1

## 2014-06-03 MED ORDER — PANTOPRAZOLE SODIUM 40 MG PO TBEC
40.0000 mg | DELAYED_RELEASE_TABLET | Freq: Every day | ORAL | Status: DC
  Administered 2014-06-03 – 2014-06-04 (×2): 40 mg via ORAL
  Filled 2014-06-03 (×2): qty 1

## 2014-06-03 MED ORDER — ONDANSETRON HCL 4 MG/2ML IJ SOLN: 4.0000 mg | Freq: Once | INTRAMUSCULAR | Status: DC | PRN

## 2014-06-03 MED ORDER — LIDOCAINE HCL (CARDIAC) 20 MG/ML IV SOLN
INTRAVENOUS | Status: AC
  Filled 2014-06-03: qty 5

## 2014-06-03 MED ORDER — FINASTERIDE 5 MG PO TABS
5.0000 mg | ORAL_TABLET | Freq: Every day | ORAL | Status: DC
Start: 2014-06-03 — End: 2014-06-04
  Administered 2014-06-03: 5 mg via ORAL
  Filled 2014-06-03: qty 1

## 2014-06-03 MED ORDER — OXYBUTYNIN CHLORIDE ER 10 MG PO TB24
10.0000 mg | ORAL_TABLET | Freq: Every day | ORAL | Status: DC
  Administered 2014-06-03 – 2014-06-04 (×2): 10 mg via ORAL
  Filled 2014-06-03 (×2): qty 1

## 2014-06-03 MED ORDER — FENTANYL CITRATE 0.05 MG/ML IJ SOLN
INTRAMUSCULAR | Status: AC
  Filled 2014-06-03: qty 2

## 2014-06-03 MED ORDER — EPHEDRINE SULFATE 50 MG/ML IJ SOLN
INTRAMUSCULAR | Status: DC | PRN
  Administered 2014-06-03 (×3): 5 mg via INTRAVENOUS

## 2014-06-03 MED ORDER — PROPOFOL 10 MG/ML IV BOLUS
INTRAVENOUS | Status: DC | PRN
  Administered 2014-06-03: 130 mg via INTRAVENOUS

## 2014-06-03 MED ORDER — PROPOFOL 10 MG/ML IV BOLUS
INTRAVENOUS | Status: AC
  Filled 2014-06-03: qty 20

## 2014-06-03 MED ORDER — INSULIN ASPART 100 UNIT/ML ~~LOC~~ SOLN
0.0000 [IU] | Freq: Three times a day (TID) | SUBCUTANEOUS | Status: DC
  Administered 2014-06-04: 2 [IU] via SUBCUTANEOUS

## 2014-06-03 MED ORDER — GABAPENTIN 100 MG PO CAPS
100.0000 mg | ORAL_CAPSULE | Freq: Three times a day (TID) | ORAL | Status: DC
  Administered 2014-06-03 – 2014-06-04 (×4): 100 mg via ORAL
  Filled 2014-06-03 (×5): qty 1

## 2014-06-03 MED ORDER — LATANOPROST 0.005 % OP SOLN
1.0000 [drp] | Freq: Every day | OPHTHALMIC | Status: DC
  Administered 2014-06-03: 1 [drp] via OPHTHALMIC
  Filled 2014-06-03: qty 2.5

## 2014-06-03 MED ORDER — CISATRACURIUM BESYLATE (PF) 10 MG/5ML IV SOLN
INTRAVENOUS | Status: DC | PRN
  Administered 2014-06-03: 4 mg via INTRAVENOUS
  Administered 2014-06-03: 2 mg via INTRAVENOUS

## 2014-06-03 MED ORDER — FENTANYL CITRATE 0.05 MG/ML IJ SOLN
25.0000 ug | INTRAMUSCULAR | Status: DC | PRN
  Administered 2014-06-03 (×4): 25 ug via INTRAVENOUS

## 2014-06-03 MED ORDER — LACTATED RINGERS IV SOLN
INTRAVENOUS | Status: DC | PRN
  Administered 2014-06-03: 07:00:00 via INTRAVENOUS
  Administered 2014-06-03: 1000 mL

## 2014-06-03 MED ORDER — HYDROMORPHONE HCL 1 MG/ML IJ SOLN
0.5000 mg | INTRAMUSCULAR | Status: DC | PRN
  Administered 2014-06-03 – 2014-06-04 (×2): 1 mg via INTRAVENOUS
  Filled 2014-06-03 (×2): qty 1

## 2014-06-03 SURGICAL SUPPLY — 21 items
BAG URINE DRAINAGE (UROLOGICAL SUPPLIES) IMPLANT
BAG URO CATCHER STRL LF (DRAPE) ×3 IMPLANT
CATH FOLEY 2WAY SLVR 30CC 22FR (CATHETERS) ×2 IMPLANT
CATH FOLEY 3WAY 30CC 22FR (CATHETERS) IMPLANT
CATH URET 5FR 28IN OPEN ENDED (CATHETERS) IMPLANT
DRAPE CAMERA CLOSED 9X96 (DRAPES) ×3 IMPLANT
ELECT BUTTON HF 24-28F 2 30DE (ELECTRODE) ×3 IMPLANT
ELECT LOOP MED HF 24F 12D (CUTTING LOOP) ×1 IMPLANT
ELECT LOOP MED HF 24F 12D CBL (CLIP) ×1 IMPLANT
ELECT RESECT VAPORIZE 12D CBL (ELECTRODE) ×1 IMPLANT
GLOVE SURG SS PI 8.0 STRL IVOR (GLOVE) IMPLANT
GOWN STRL REUS W/TWL XL LVL3 (GOWN DISPOSABLE) ×3 IMPLANT
HOLDER FOLEY CATH W/STRAP (MISCELLANEOUS) IMPLANT
KIT ASPIRATION TUBING (SET/KITS/TRAYS/PACK) ×3 IMPLANT
MANIFOLD NEPTUNE II (INSTRUMENTS) ×3 IMPLANT
PACK CYSTO (CUSTOM PROCEDURE TRAY) ×3 IMPLANT
SUT ETHILON 3 0 PS 1 (SUTURE) IMPLANT
SYR 30ML LL (SYRINGE) IMPLANT
SYRINGE IRR TOOMEY STRL 70CC (SYRINGE) ×2 IMPLANT
TUBING CONNECTING 10 (TUBING) ×2 IMPLANT
TUBING CONNECTING 10' (TUBING) ×1

## 2014-06-03 NOTE — Op Note (Signed)
[name], [name]                ACCOUNT NO.:  [id number]  MEDICAL RECORD NO.:  [id number]  LOCATION:  [facility]                         FACILITY:  [facility]  PHYSICIAN:  Marshall Cork. Jeffie Pollock, M.D.    DATE OF BIRTH:  [birth year]  DATE OF PROCEDURE:  06/03/2014 DATE OF DISCHARGE:                              OPERATIVE REPORT   PROCEDURE: 1. Cystoscopy with transurethral resection of greater than 5 cm     bladder tumor. 2. Instillation of mitomycin C (in PACU).  PREOPERATIVE DIAGNOSIS:  Bladder tumor 2.4 cm.  POSTOPERATIVE DIAGNOSIS:  Bladder tumor greater than 5 cm.  SURGEON:  Marshall Cork. Jeffie Pollock, MD  ANESTHESIA:  General.  SPECIMEN:  Bladder tumor chips.  DRAINS:  A 22-French Foley catheter.  BLOOD LOSS:  Minimal.  COMPLICATIONS:  None.  INDICATIONS:  Mr. Fettig is a 80 year old African American who presented with gross hematuria.  A CT scan demonstrated a bladder tumor at the right lateral wall and bladder neck.  The tumor mass appeared to measure 2.4 cm on CT scan.  It was felt that transurethral resection with possible instillation of mitomycin C were indicated.  FINDINGS AND PROCEDURE:  He was given Cipro.  He was taken to the operating room where general anesthetic was induced.  He was fitted with a PAS hose and placed in lithotomy position.  His genitalia was prepped with Betadine solution.  He was draped in usual sterile fashion.  A paralytic agent was given to help avoid obturator reflex.  A 28-French continuous flow resectoscope sheath was passed without difficulty with the aid of visual obturator.  Examination revealed a normal urethra. The external sphincter was intact.  The prostatic urethra had bilobar hyperplasia with minimal obstruction.  Examination of bladder revealed mild trabeculation.  Ureteral orifices were unremarkable.  On the right lateral wall near the bladder neck, the tumor was identified.  There was a large intravesical tumor frond which had been measured on  CT, but there was also tumor extending out to the bladder neck from approximately 11 o'clock down to about 6 o'clock, the extent of the tumor including the surrounding mucosal abnormality with small tumor frond exceeded 5 cm.  Once initial inspection was performed and a small clot had been evacuated, the tumor was then resected using the gyrus bipolar resectoscope with saline for the irrigant.  The tumor was resected back to the muscular wall and area of small amounts of fat be seen ensuring muscle and the sample.  Once the bulk of the tumor had been removed, hemostasis was achieved.  Additional resection in the base was performed, this was felt necessary and generous fulguration of the surrounding mucosal area was also performed as there were some mucosal abnormalities that were suggestive of carcinoma in situ.  Once a full tumor had been resected and hemostasis was achieved, the bladder was evacuated free of the chips and the bladder was reinspected. No additional tumors were noted.  No active bleeding was noted.  The tumor bed appeared clean, however I do have concern for muscle invasive disease.  Once final hemostasis was achieved, and no additional chips were noted in the bladder, the scope was removed and  a 22-French Foley catheter was inserted.  The balloon was filled with 20 mL of sterile fluid.  The catheter was hand irrigated with clear return and placed to straight drainage.  The patient was taken down from lithotomy position.  His anesthetic was reversed.  He was moved to recovery room in stable condition.  In the recovery room, his bladder was instilled with 40 mg of mitomycin C and 40 mL of diluent, this was left indwelling for 1 hour prior to drainage.  There were no complications during the procedure.     Marshall Cork. Jeffie Pollock, M.D.     JJW/MEDQ  D:  06/03/2014  T:  06/03/2014  Job:  [id number]

## 2014-06-03 NOTE — Anesthesia Postprocedure Evaluation (Signed)
  Anesthesia Post-op Note  Patient: [name]  Procedure(s) Performed: Procedure(s) (LRB): TRANSURETHRAL RESECTION OF BLADDER TUMOR WITH GYRUS (TURBT-GYRUS) (N/A) CYSTO (N/A)  Patient Location: PACU  Anesthesia Type: General  Level of Consciousness: awake and alert   Airway and Oxygen Therapy: Patient Spontanous Breathing  Post-op Pain: mild  Post-op Assessment: Post-op Vital signs reviewed, Patient's Cardiovascular Status Stable, Respiratory Function Stable, Patent Airway and No signs of Nausea or vomiting  Last Vitals:  Filed Vitals:   06/03/14 0830  BP: 124/102  Pulse: 70  Resp: 18    Post-op Vital Signs: stable   Complications: No apparent anesthesia complications

## 2014-06-03 NOTE — Progress Notes (Signed)
Patient ID: [name], male   DOB: 05-23-1933, 80 y.o.   MRN: [id number]  He is doing well without complaints.  Urine is light pink.

## 2014-06-03 NOTE — Progress Notes (Signed)
Catheter drained, foley discarded, new foley bag attached

## 2014-06-03 NOTE — Interval H&P Note (Signed)
History and Physical Interval Note:  06/03/2014 7:22 AM  [name]  has presented today for surgery, with the diagnosis of BLADDER TUMOR  The various methods of treatment have been discussed with the patient and family. After consideration of risks, benefits and other options for treatment, the patient has consented to  Procedure(s): TRANSURETHRAL RESECTION OF BLADDER TUMOR WITH GYRUS (TURBT-GYRUS) WITH INSTILLATION MITOMYCIN C (N/A) CYSTO (N/A) as a surgical intervention .  The patient's history has been reviewed, patient examined, no change in status, stable for surgery.  I have reviewed the patient's chart and labs.  Questions were answered to the patient's satisfaction.     Glendel Jaggers J

## 2014-06-03 NOTE — Transfer of Care (Signed)
Immediate Anesthesia Transfer of Care Note  Patient: [name]  Procedure(s) Performed: Procedure(s): TRANSURETHRAL RESECTION OF BLADDER TUMOR WITH GYRUS (TURBT-GYRUS) (N/A) CYSTO (N/A)  Patient Location: PACU  Anesthesia Type:General  Level of Consciousness: awake, alert , oriented and patient cooperative  Airway & Oxygen Therapy: Patient Spontanous Breathing  Post-op Assessment: Report given to PACU RN, Post -op Vital signs reviewed and stable and Patient moving all extremities  Post vital signs: Reviewed and stable  Complications: No apparent anesthesia complications

## 2014-06-03 NOTE — Progress Notes (Signed)
Dr. Jeffie Pollock in to instill mitomycin into bladder; catheter plugged; states he wants med to remain in bladder X 1 hour; med given for pts c/o burning

## 2014-06-03 NOTE — Discharge Instructions (Signed)
Cystoscopy, Care After   Refer to this sheet in the next few weeks. These instructions provide you with information on caring for yourself after your procedure. Your caregiver may also give you more specific instructions. Your treatment has been planned according to current medical practices, but problems sometimes occur. Call your caregiver if you have any problems or questions after your procedure.   HOME CARE INSTRUCTIONS   Things you can do to ease any discomfort after your procedure include:   Drinking enough water and fluids to keep your urine clear or pale yellow.   Taking a warm bath to relieve any burning feelings.  SEEK IMMEDIATE MEDICAL CARE IF:   You have an increase in blood in your urine.   You notice blood clots in your urine.   You have difficulty passing urine.   You have the chills.   You have abdominal pain.   You have a fever or persistent symptoms for more than 2-3 days.   You have a fever and your symptoms suddenly get worse.  MAKE SURE YOU:   Understand these instructions.   Will watch your condition.   Will get help right away if you are not doing well or get worse.  Document Released: 02/16/2005 Document Revised: 04/01/2013 Document Reviewed: 01/21/2012   Exit[employer]® Patient Information ©2015 [employer]. This information is not intended to replace advice given to you by your health care provider. Make sure you discuss any questions you have with your health care provider.

## 2014-06-03 NOTE — Brief Op Note (Signed)
06/03/2014  8:13 AM  PATIENT:  [name]  80 y.o. male  PRE-OPERATIVE DIAGNOSIS:  BLADDER TUMOR 2.4 cm  POST-OPERATIVE DIAGNOSIS:  BLADDER TUMOR >5cm  PROCEDURE:  Procedure(s): TRANSURETHRAL RESECTION OF BLADDER TUMOR WITH GYRUS (TURBT-GYRUS) (N/A) CYSTO (N/A) >5cm INSTILLATION OF MITOMYCIN C (In PACU)  SURGEON:  Surgeon(s) and Role:    * [name], MD - Primary  PHYSICIAN ASSISTANT:   ASSISTANTS: none   ANESTHESIA:   general  EBL:     BLOOD ADMINISTERED:none  DRAINS: Urinary Catheter (Foley)   LOCAL MEDICATIONS USED:  NONE  SPECIMEN:  Source of Specimen:  bladder tumor chips  DISPOSITION OF SPECIMEN:  PATHOLOGY  COUNTS:  YES  TOURNIQUET:  * No tourniquets in log *  DICTATION: .Other Dictation: Dictation Number [id number]  PLAN OF CARE: Admit for overnight observation  PATIENT DISPOSITION:  PACU - hemodynamically stable.   Delay start of Pharmacological VTE agent (>24hrs) due to surgical blood loss or risk of bleeding: yes

## 2014-06-04 ENCOUNTER — Encounter (HOSPITAL_COMMUNITY): Payer: Self-pay | Admitting: Urology

## 2014-06-04 DIAGNOSIS — C675 Malignant neoplasm of bladder neck: Secondary | ICD-10-CM | POA: Diagnosis not present

## 2014-06-04 LAB — GLUCOSE, CAPILLARY
GLUCOSE-CAPILLARY: 135 mg/dL — AB (ref 70–99)
Glucose-Capillary: 113 mg/dL — ABNORMAL HIGH (ref 70–99)

## 2014-06-04 NOTE — Progress Notes (Signed)
Pt left with his spouse at this time. Alert, oriented, and without c/o. Discharge instructions/prescription given/explained with pt verbalizing understanding. Followup appointment noted.

## 2014-06-04 NOTE — Progress Notes (Signed)
UR completed 

## 2014-06-04 NOTE — Discharge Summary (Signed)
Physician Discharge Summary  Patient ID: [name] MRN: [id number] DOB/AGE: 1933-06-13 79 y.o.  Admit date: 06/03/2014 Discharge date: 06/04/2014  Admission Diagnoses:  Bladder cancer  Discharge Diagnoses:  Principal Problem:   Bladder cancer   Past Medical History  Diagnosis Date  . History of colon cancer   . Hypertension   . Osteoporosis   . Peripheral neuropathy   . Allergic rhinitis   . BPH (benign prostatic hypertrophy)     FREQUENT URINATION   . Posttraumatic stress disorder   . DJD (degenerative joint disease)   . Dyspepsia   . DM2 (diabetes mellitus, type 2)   . Hearing loss     bilateral  . Cataract     bilateral extrraction and lens implants '88-,'89  . Tubular adenoma 02/17/2008  . Diverticulosis   . Anemia   . Cancer   . GERD (gastroesophageal reflux disease)   . Depression   . Glaucoma   . Glaucoma   . Nonsustained ventricular tachycardia     PAST HISTORY  . Bladder tumor     WAS HAVING BLOOD IN URINE - CLEARED BUT OCCAS CLOT NOW  . Restless leg syndrome   . Multiple pulmonary nodules     SEEN ON CT CHEST DONE 05/25/14 AT [facility]    Surgeries: Procedure(s): TRANSURETHRAL RESECTION OF BLADDER TUMOR WITH GYRUS (TURBT-GYRUS) CYSTO on 06/03/2014   Consultants (if any):    Discharged Condition: Improved  Hospital Course: [name] is an 80 y.o. male who was admitted 06/03/2014 with a diagnosis of Bladder cancer and went to the operating room on 06/03/2014 and underwent the above named procedures.  He was found to have a large right bladder neck papillary tumor.   His foley was removed this morning and he will be discharged when voiding.    He is without complaints.  Lungs clear. CV RRR with murmur.   He was given perioperative antibiotics:  Anti-infectives   Start     Dose/Rate Route Frequency Ordered Stop   06/03/14 2000  ciprofloxacin (CIPRO) tablet 250 mg     250 mg Oral 2 times daily 06/03/14 1000     06/03/14 0531  ciprofloxacin (CIPRO) IVPB 400 mg     400 mg 200 mL/hr over 60 Minutes Intravenous 60 min pre-op 06/03/14 0531 06/03/14 0748    .  He was given sequential compression devices for DVT prophylaxis.  He benefited maximally from the hospital stay and there were no complications.    Recent vital signs:  Filed Vitals:   06/04/14 0545  BP: 137/76  Pulse: 77  Temp: 98.2 F (36.8 C)  Resp: 16    Recent laboratory studies:  Lab Results  Component Value Date   HGB 14.7 05/28/2014   HGB 13.2* 09/04/2012   HGB 12.7* 06/10/2012   Lab Results  Component Value Date   WBC 3.8* 05/28/2014   PLT 211 05/28/2014   No results found for this basename: INR   Lab Results  Component Value Date   NA 143 05/28/2014   K 4.3 05/28/2014   CL 102 05/28/2014   CO2 29 05/28/2014   BUN 21 05/28/2014   CREATININE 1.04 05/28/2014   GLUCOSE 80 05/28/2014    Discharge Medications:     Medication List         acetaminophen 325 MG tablet  Commonly known as:  TYLENOL  Take 650 mg by mouth every 6 (six) hours as needed for mild pain. Take 2 tablets  by mouth every 6 hours as needed for pain or fever.     ALIGN 4 MG Caps  Take 1 capsule by mouth daily.     ammonium lactate 12 % lotion  Commonly known as:  LAC-HYDRIN  Apply 1 application topically daily as needed for dry skin.     aspirin 81 MG tablet  Take 81 mg by mouth daily.     atenolol 25 MG tablet  Commonly known as:  TENORMIN  Take 25 mg by mouth every morning.     atorvastatin 20 MG tablet  Commonly known as:  LIPITOR  Take 10 mg by mouth at bedtime.     bisacodyl 5 MG EC tablet  Commonly known as:  DULCOLAX  Take 10 mg by mouth 2 (two) times daily as needed for mild constipation.     CALCIUM 500 + D PO  Take 1 tablet by mouth daily.     cetirizine 10 MG tablet  Commonly known as:  ZYRTEC  Take 10 mg by mouth daily.     clonazePAM 0.5 MG tablet  Commonly known as:  KLONOPIN  Take 0.5 mg by mouth every 6  (six) hours as needed for anxiety.     cyanocobalamin 1000 MCG/ML injection  Commonly known as:  (VITAMIN B-12)  Inject 1,000 mcg into the muscle every 28 (twenty-eight) days.     finasteride 5 MG tablet  Commonly known as:  PROSCAR  Take 5 mg by mouth at bedtime.     gabapentin 100 MG capsule  Commonly known as:  NEURONTIN  Take 100 mg by mouth 3 (three) times daily.     guaifenesin 400 MG Tabs tablet  Commonly known as:  HUMIBID E  Take 400 mg by mouth every 4 (four) hours as needed (FOR CONGESTION).     HYDROcodone-acetaminophen 5-325 MG per tablet  Commonly known as:  NORCO  Take 1 tablet by mouth every 6 (six) hours as needed for moderate pain.     lactose free nutrition Liqd  Take 237 mLs by mouth daily.     latanoprost 0.005 % ophthalmic solution  Commonly known as:  XALATAN  Place 1 drop into the right eye at bedtime.     lisinopril-hydrochlorothiazide 20-12.5 MG per tablet  Commonly known as:  PRINZIDE,ZESTORETIC  Take 1 tablet by mouth every morning.     MARINE LIPID CONCENTRATE PO  Take 1 capsule by mouth daily.     metFORMIN 500 MG tablet  Commonly known as:  GLUCOPHAGE  Take 500 mg by mouth 2 (two) times daily with a meal.     omeprazole 20 MG capsule  Commonly known as:  PRILOSEC  Take 20 mg by mouth 2 (two) times daily.     OVER THE COUNTER MEDICATION  Take 1 capsule by mouth daily.     oxybutynin 10 MG 24 hr tablet  Commonly known as:  DITROPAN-XL  Take 10 mg by mouth daily.     terazosin 2 MG capsule  Commonly known as:  HYTRIN  Take 2 mg by mouth at bedtime.     venlafaxine XR 75 MG 24 hr capsule  Commonly known as:  EFFEXOR-XR  Take 75 mg by mouth 2 (two) times daily.     Vitamin D-3 1000 UNITS Caps  Take 1 capsule by mouth daily.        Diagnostic Studies: No results found.  Disposition: Home      Discharge Instructions   Discontinue IV  Complete by:  As directed            Follow-up Information   Follow up with  [name], MD On 06/10/2014. (115)    Specialty:  Urology   Contact information:   [place] [id number] [phone]        Signed: [name] 06/04/2014, 7:06 AM

## 2014-06-04 NOTE — Care Management Note (Signed)
    Page 1 of 1   06/04/2014     1:01:22 PM CARE MANAGEMENT NOTE 06/04/2014  Patient:  [name], [name]   Account Number:  [id number]  Date Initiated:  06/04/2014  Documentation initiated by:  Dessa Phi  Subjective/Objective Assessment:   80 Y/O M ADMITTED W/BLADDER CA.     Action/Plan:   FROM HOME.   Anticipated DC Date:  06/04/2014   Anticipated DC Plan:  [place]  CM consult      Choice offered to / List presented to:             Status of service:  Completed, signed off Medicare Important Message given?   (If response is "NO", the following Medicare IM given date fields will be blank) Date Medicare IM given:   Medicare IM given by:   Date Additional Medicare IM given:   Additional Medicare IM given by:    Discharge Disposition:  HOME/SELF CARE  Per UR Regulation:  Reviewed for med. necessity/level of care/duration of stay  If discussed at [place] of Stay Meetings, dates discussed:    Comments:  06/04/14 Media Pizzini RN,BSN NCM [phone] D/C HOME NO [place].

## 2014-06-10 ENCOUNTER — Other Ambulatory Visit: Payer: Self-pay | Admitting: Urology

## 2014-06-10 DIAGNOSIS — C672 Malignant neoplasm of lateral wall of bladder: Secondary | ICD-10-CM | POA: Diagnosis not present

## 2014-06-14 ENCOUNTER — Encounter (HOSPITAL_COMMUNITY): Payer: Self-pay | Admitting: *Deleted

## 2014-06-24 ENCOUNTER — Ambulatory Visit (HOSPITAL_COMMUNITY): Payer: Medicare Other | Admitting: Anesthesiology

## 2014-06-24 ENCOUNTER — Encounter (HOSPITAL_COMMUNITY)
Admission: RE | Disposition: A | Discharge status: Home / Self Care | Payer: Self-pay | Source: Ambulatory Visit | Attending: Urology

## 2014-06-24 ENCOUNTER — Encounter (HOSPITAL_COMMUNITY): Payer: Self-pay | Admitting: *Deleted

## 2014-06-24 ENCOUNTER — Ambulatory Visit (HOSPITAL_COMMUNITY)
Admission: RE | Admit: 2014-06-24 | Discharge: 2014-06-25 | Disposition: A | Discharge status: Home / Self Care | Payer: Medicare Other | Source: Ambulatory Visit | Attending: Urology | Admitting: Urology

## 2014-06-24 DIAGNOSIS — I10 Essential (primary) hypertension: Secondary | ICD-10-CM | POA: Insufficient documentation

## 2014-06-24 DIAGNOSIS — F329 Major depressive disorder, single episode, unspecified: Secondary | ICD-10-CM | POA: Diagnosis not present

## 2014-06-24 DIAGNOSIS — D090 Carcinoma in situ of bladder: Secondary | ICD-10-CM | POA: Insufficient documentation

## 2014-06-24 DIAGNOSIS — Z87891 Personal history of nicotine dependence: Secondary | ICD-10-CM | POA: Diagnosis not present

## 2014-06-24 DIAGNOSIS — N281 Cyst of kidney, acquired: Secondary | ICD-10-CM | POA: Diagnosis not present

## 2014-06-24 DIAGNOSIS — C679 Malignant neoplasm of bladder, unspecified: Secondary | ICD-10-CM | POA: Diagnosis not present

## 2014-06-24 DIAGNOSIS — F419 Anxiety disorder, unspecified: Secondary | ICD-10-CM | POA: Diagnosis not present

## 2014-06-24 DIAGNOSIS — N138 Other obstructive and reflux uropathy: Secondary | ICD-10-CM | POA: Diagnosis not present

## 2014-06-24 DIAGNOSIS — N401 Enlarged prostate with lower urinary tract symptoms: Secondary | ICD-10-CM | POA: Insufficient documentation

## 2014-06-24 DIAGNOSIS — Z79899 Other long term (current) drug therapy: Secondary | ICD-10-CM | POA: Diagnosis not present

## 2014-06-24 DIAGNOSIS — F4310 Post-traumatic stress disorder, unspecified: Secondary | ICD-10-CM | POA: Diagnosis not present

## 2014-06-24 DIAGNOSIS — K219 Gastro-esophageal reflux disease without esophagitis: Secondary | ICD-10-CM | POA: Insufficient documentation

## 2014-06-24 DIAGNOSIS — N529 Male erectile dysfunction, unspecified: Secondary | ICD-10-CM | POA: Diagnosis not present

## 2014-06-24 DIAGNOSIS — K5900 Constipation, unspecified: Secondary | ICD-10-CM | POA: Insufficient documentation

## 2014-06-24 DIAGNOSIS — N319 Neuromuscular dysfunction of bladder, unspecified: Secondary | ICD-10-CM | POA: Diagnosis not present

## 2014-06-24 DIAGNOSIS — R310 Gross hematuria: Secondary | ICD-10-CM | POA: Insufficient documentation

## 2014-06-24 DIAGNOSIS — C672 Malignant neoplasm of lateral wall of bladder: Secondary | ICD-10-CM | POA: Diagnosis not present

## 2014-06-24 DIAGNOSIS — G2581 Restless legs syndrome: Secondary | ICD-10-CM | POA: Diagnosis not present

## 2014-06-24 DIAGNOSIS — C675 Malignant neoplasm of bladder neck: Secondary | ICD-10-CM

## 2014-06-24 DIAGNOSIS — R351 Nocturia: Secondary | ICD-10-CM | POA: Diagnosis not present

## 2014-06-24 DIAGNOSIS — E119 Type 2 diabetes mellitus without complications: Secondary | ICD-10-CM | POA: Insufficient documentation

## 2014-06-24 DIAGNOSIS — Z7982 Long term (current) use of aspirin: Secondary | ICD-10-CM | POA: Diagnosis not present

## 2014-06-24 DIAGNOSIS — N3941 Urge incontinence: Secondary | ICD-10-CM | POA: Insufficient documentation

## 2014-06-24 DIAGNOSIS — D649 Anemia, unspecified: Secondary | ICD-10-CM | POA: Diagnosis not present

## 2014-06-24 DIAGNOSIS — G629 Polyneuropathy, unspecified: Secondary | ICD-10-CM | POA: Diagnosis not present

## 2014-06-24 HISTORY — PX: TRANSURETHRAL RESECTION OF BLADDER TUMOR: SHX2575

## 2014-06-24 LAB — GLUCOSE, CAPILLARY
Glucose-Capillary: 105 mg/dL — ABNORMAL HIGH (ref 70–99)
Glucose-Capillary: 107 mg/dL — ABNORMAL HIGH (ref 70–99)
Glucose-Capillary: 107 mg/dL — ABNORMAL HIGH (ref 70–99)
Glucose-Capillary: 92 mg/dL (ref 70–99)

## 2014-06-24 SURGERY — TURBT (TRANSURETHRAL RESECTION OF BLADDER TUMOR)
Anesthesia: General | Site: Bladder

## 2014-06-24 MED ORDER — BOOST PO LIQD
237.0000 mL | Freq: Every day | ORAL | Status: DC
  Administered 2014-06-25: 237 mL via ORAL
  Filled 2014-06-24: qty 237

## 2014-06-24 MED ORDER — FINASTERIDE 5 MG PO TABS
5.0000 mg | ORAL_TABLET | Freq: Every day | ORAL | Status: DC
Start: 2014-06-24 — End: 2014-06-25
  Administered 2014-06-24: 5 mg via ORAL
  Filled 2014-06-24: qty 1

## 2014-06-24 MED ORDER — MITOMYCIN CHEMO FOR BLADDER INSTILLATION 40 MG
40.0000 mg | Freq: Once | INTRAVENOUS | Status: DC
Start: 2014-06-24 — End: 2014-06-24
  Administered 2014-06-24: 40 mg via INTRAVESICAL
  Filled 2014-06-24: qty 40

## 2014-06-24 MED ORDER — SACCHAROMYCES BOULARDII 250 MG PO CAPS
250.0000 mg | ORAL_CAPSULE | Freq: Two times a day (BID) | ORAL | Status: DC
  Administered 2014-06-24 – 2014-06-25 (×2): 250 mg via ORAL
  Filled 2014-06-24 (×2): qty 1

## 2014-06-24 MED ORDER — PROMETHAZINE HCL 25 MG/ML IJ SOLN: 6.2500 mg | INTRAMUSCULAR | Status: DC | PRN

## 2014-06-24 MED ORDER — LACTATED RINGERS IV SOLN
INTRAVENOUS | Status: DC
  Administered 2014-06-24: 1000 mL via INTRAVENOUS

## 2014-06-24 MED ORDER — ZOLPIDEM TARTRATE 5 MG PO TABS: 5.0000 mg | ORAL_TABLET | Freq: Every evening | ORAL | Status: DC | PRN

## 2014-06-24 MED ORDER — DOCUSATE SODIUM 100 MG PO CAPS
100.0000 mg | ORAL_CAPSULE | Freq: Two times a day (BID) | ORAL | Status: DC
Start: 2014-06-24 — End: 2014-06-25
  Administered 2014-06-24 – 2014-06-25 (×2): 100 mg via ORAL
  Filled 2014-06-24 (×2): qty 1

## 2014-06-24 MED ORDER — ACETAMINOPHEN 325 MG PO TABS: 650.0000 mg | ORAL_TABLET | Freq: Four times a day (QID) | ORAL | Status: DC | PRN

## 2014-06-24 MED ORDER — TERAZOSIN HCL 2 MG PO CAPS
2.0000 mg | ORAL_CAPSULE | Freq: Every day | ORAL | Status: DC
  Administered 2014-06-24: 2 mg via ORAL
  Filled 2014-06-24: qty 1

## 2014-06-24 MED ORDER — PHENYLEPHRINE HCL 10 MG/ML IJ SOLN
INTRAMUSCULAR | Status: DC | PRN
  Administered 2014-06-24 (×2): 120 ug via INTRAVENOUS

## 2014-06-24 MED ORDER — ONDANSETRON HCL 4 MG/2ML IJ SOLN: 4.0000 mg | INTRAMUSCULAR | Status: DC | PRN

## 2014-06-24 MED ORDER — AMMONIUM LACTATE 12 % EX LOTN: 1.0000 "application " | TOPICAL_LOTION | Freq: Every day | CUTANEOUS | Status: DC | PRN

## 2014-06-24 MED ORDER — METFORMIN HCL 500 MG PO TABS
500.0000 mg | ORAL_TABLET | Freq: Two times a day (BID) | ORAL | Status: DC
  Administered 2014-06-25: 500 mg via ORAL
  Filled 2014-06-24 (×2): qty 1

## 2014-06-24 MED ORDER — INSULIN ASPART 100 UNIT/ML ~~LOC~~ SOLN: 0.0000 [IU] | Freq: Three times a day (TID) | SUBCUTANEOUS | Status: DC

## 2014-06-24 MED ORDER — HYDROCODONE-ACETAMINOPHEN 5-325 MG PO TABS: 1.0000 | ORAL_TABLET | Freq: Four times a day (QID) | ORAL | Status: DC | PRN

## 2014-06-24 MED ORDER — OXYBUTYNIN CHLORIDE ER 10 MG PO TB24
10.0000 mg | ORAL_TABLET | Freq: Every day | ORAL | Status: DC
  Administered 2014-06-24 – 2014-06-25 (×2): 10 mg via ORAL
  Filled 2014-06-24 (×2): qty 1

## 2014-06-24 MED ORDER — ATORVASTATIN CALCIUM 10 MG PO TABS
10.0000 mg | ORAL_TABLET | Freq: Every day | ORAL | Status: DC
  Administered 2014-06-24: 10 mg via ORAL
  Filled 2014-06-24: qty 1

## 2014-06-24 MED ORDER — CIPROFLOXACIN IN D5W 400 MG/200ML IV SOLN
400.0000 mg | INTRAVENOUS | Status: AC
  Administered 2014-06-24: 400 mg via INTRAVENOUS

## 2014-06-24 MED ORDER — CIPROFLOXACIN IN D5W 400 MG/200ML IV SOLN
INTRAVENOUS | Status: AC
  Filled 2014-06-24: qty 200

## 2014-06-24 MED ORDER — PANTOPRAZOLE SODIUM 40 MG PO TBEC
40.0000 mg | DELAYED_RELEASE_TABLET | Freq: Every day | ORAL | Status: DC
  Administered 2014-06-24 – 2014-06-25 (×2): 40 mg via ORAL
  Filled 2014-06-24 (×2): qty 1

## 2014-06-24 MED ORDER — LIDOCAINE HCL (CARDIAC) 10 MG/ML IV SOLN
INTRAVENOUS | Status: DC | PRN
  Administered 2014-06-24: 100 mg via INTRAVENOUS

## 2014-06-24 MED ORDER — FENTANYL CITRATE 0.05 MG/ML IJ SOLN
INTRAMUSCULAR | Status: AC
  Filled 2014-06-24: qty 5

## 2014-06-24 MED ORDER — HYDROMORPHONE HCL 1 MG/ML IJ SOLN
0.5000 mg | INTRAMUSCULAR | Status: DC | PRN
  Administered 2014-06-24: 1 mg via INTRAVENOUS
  Filled 2014-06-24: qty 1

## 2014-06-24 MED ORDER — LACTATED RINGERS IV SOLN
INTRAVENOUS | Status: DC | PRN
  Administered 2014-06-24 (×2): via INTRAVENOUS

## 2014-06-24 MED ORDER — CLONAZEPAM 0.5 MG PO TABS: 0.5000 mg | ORAL_TABLET | Freq: Four times a day (QID) | ORAL | Status: DC | PRN

## 2014-06-24 MED ORDER — BELLADONNA ALKALOIDS-OPIUM 16.2-60 MG RE SUPP
RECTAL | Status: AC
  Filled 2014-06-24: qty 1

## 2014-06-24 MED ORDER — KETOROLAC TROMETHAMINE 15 MG/ML IJ SOLN
INTRAMUSCULAR | Status: AC
  Filled 2014-06-24: qty 1

## 2014-06-24 MED ORDER — HYDROCODONE-ACETAMINOPHEN 5-325 MG PO TABS
1.0000 | ORAL_TABLET | ORAL | Status: DC | PRN
  Administered 2014-06-24 (×2): 2 via ORAL
  Filled 2014-06-24 (×2): qty 2

## 2014-06-24 MED ORDER — GABAPENTIN 100 MG PO CAPS
100.0000 mg | ORAL_CAPSULE | Freq: Three times a day (TID) | ORAL | Status: DC
  Administered 2014-06-24 – 2014-06-25 (×2): 100 mg via ORAL
  Filled 2014-06-24 (×3): qty 1

## 2014-06-24 MED ORDER — ACETAMINOPHEN 325 MG PO TABS: 650.0000 mg | ORAL_TABLET | ORAL | Status: DC | PRN

## 2014-06-24 MED ORDER — LIDOCAINE HCL 2 % EX GEL
CUTANEOUS | Status: AC
  Filled 2014-06-24: qty 10

## 2014-06-24 MED ORDER — VENLAFAXINE HCL ER 75 MG PO CP24
75.0000 mg | ORAL_CAPSULE | Freq: Two times a day (BID) | ORAL | Status: DC
  Administered 2014-06-24 – 2014-06-25 (×2): 75 mg via ORAL
  Filled 2014-06-24 (×2): qty 1

## 2014-06-24 MED ORDER — POTASSIUM CHLORIDE IN NACL 20-0.45 MEQ/L-% IV SOLN
INTRAVENOUS | Status: DC
  Administered 2014-06-24 – 2014-06-25 (×2): via INTRAVENOUS
  Filled 2014-06-24 (×3): qty 1000

## 2014-06-24 MED ORDER — OXYBUTYNIN CHLORIDE 5 MG PO TABS
5.0000 mg | ORAL_TABLET | Freq: Three times a day (TID) | ORAL | Status: DC | PRN
  Administered 2014-06-25: 5 mg via ORAL
  Filled 2014-06-24 (×3): qty 1

## 2014-06-24 MED ORDER — ONDANSETRON HCL 4 MG/2ML IJ SOLN
INTRAMUSCULAR | Status: AC
  Filled 2014-06-24: qty 2

## 2014-06-24 MED ORDER — KETOROLAC TROMETHAMINE 30 MG/ML IJ SOLN
15.0000 mg | Freq: Once | INTRAMUSCULAR | Status: AC | PRN
  Administered 2014-06-24: 15 mg via INTRAVENOUS

## 2014-06-24 MED ORDER — LISINOPRIL-HYDROCHLOROTHIAZIDE 20-12.5 MG PO TABS: 1.0000 | ORAL_TABLET | Freq: Every morning | ORAL | Status: DC

## 2014-06-24 MED ORDER — PROPOFOL 10 MG/ML IV BOLUS
INTRAVENOUS | Status: AC
  Filled 2014-06-24: qty 20

## 2014-06-24 MED ORDER — ALIGN 4 MG PO CAPS: 1.0000 | ORAL_CAPSULE | Freq: Every day | ORAL | Status: DC

## 2014-06-24 MED ORDER — GUAIFENESIN 200 MG PO TABS
400.0000 mg | ORAL_TABLET | ORAL | Status: DC | PRN
  Filled 2014-06-24: qty 2

## 2014-06-24 MED ORDER — ONDANSETRON HCL 4 MG/2ML IJ SOLN
INTRAMUSCULAR | Status: DC | PRN
  Administered 2014-06-24: 4 mg via INTRAVENOUS

## 2014-06-24 MED ORDER — LATANOPROST 0.005 % OP SOLN
1.0000 [drp] | Freq: Every day | OPHTHALMIC | Status: DC
  Administered 2014-06-24: 1 [drp] via OPHTHALMIC
  Filled 2014-06-24: qty 2.5

## 2014-06-24 MED ORDER — ATENOLOL 25 MG PO TABS
25.0000 mg | ORAL_TABLET | Freq: Every morning | ORAL | Status: DC
  Administered 2014-06-25: 25 mg via ORAL
  Filled 2014-06-24: qty 1

## 2014-06-24 MED ORDER — MIDAZOLAM HCL 2 MG/2ML IJ SOLN
INTRAMUSCULAR | Status: AC
  Filled 2014-06-24: qty 2

## 2014-06-24 MED ORDER — LIDOCAINE HCL (CARDIAC) 20 MG/ML IV SOLN
INTRAVENOUS | Status: AC
  Filled 2014-06-24: qty 5

## 2014-06-24 MED ORDER — LISINOPRIL 20 MG PO TABS
20.0000 mg | ORAL_TABLET | Freq: Every day | ORAL | Status: DC
  Filled 2014-06-24: qty 1

## 2014-06-24 MED ORDER — KETOROLAC TROMETHAMINE 30 MG/ML IJ SOLN
INTRAMUSCULAR | Status: AC
  Filled 2014-06-24: qty 1

## 2014-06-24 MED ORDER — LORATADINE 10 MG PO TABS
10.0000 mg | ORAL_TABLET | Freq: Every day | ORAL | Status: DC
  Administered 2014-06-24 – 2014-06-25 (×2): 10 mg via ORAL
  Filled 2014-06-24 (×2): qty 1

## 2014-06-24 MED ORDER — MIDAZOLAM HCL 5 MG/5ML IJ SOLN
INTRAMUSCULAR | Status: DC | PRN
  Administered 2014-06-24: 0.5 mg via INTRAVENOUS

## 2014-06-24 MED ORDER — SODIUM CHLORIDE 0.9 % IR SOLN
Status: DC | PRN
  Administered 2014-06-24: 6000 mL

## 2014-06-24 MED ORDER — FENTANYL CITRATE 0.05 MG/ML IJ SOLN
INTRAMUSCULAR | Status: DC | PRN
  Administered 2014-06-24: 50 ug via INTRAVENOUS
  Administered 2014-06-24: 100 ug via INTRAVENOUS

## 2014-06-24 MED ORDER — HYDROCHLOROTHIAZIDE 12.5 MG PO CAPS
12.5000 mg | ORAL_CAPSULE | Freq: Every day | ORAL | Status: DC
  Administered 2014-06-25: 12.5 mg via ORAL
  Filled 2014-06-24: qty 1

## 2014-06-24 MED ORDER — HYDROMORPHONE HCL 1 MG/ML IJ SOLN
0.2500 mg | INTRAMUSCULAR | Status: DC | PRN
  Administered 2014-06-24 (×2): 0.25 mg via INTRAVENOUS

## 2014-06-24 MED ORDER — HYDROMORPHONE HCL 1 MG/ML IJ SOLN
INTRAMUSCULAR | Status: AC
  Filled 2014-06-24: qty 1

## 2014-06-24 MED ORDER — PROPOFOL 10 MG/ML IV BOLUS
INTRAVENOUS | Status: DC | PRN
  Administered 2014-06-24: 30 mg via INTRAVENOUS
  Administered 2014-06-24: 170 mg via INTRAVENOUS

## 2014-06-24 SURGICAL SUPPLY — 23 items
BAG URINE DRAINAGE (UROLOGICAL SUPPLIES) ×2 IMPLANT
BAG URO CATCHER STRL LF (DRAPE) ×3 IMPLANT
CATH FOLEY 2WAY SLVR  5CC 20FR (CATHETERS) ×2
CATH FOLEY 2WAY SLVR 5CC 20FR (CATHETERS) IMPLANT
CATH FOLEY 3WAY 30CC 22FR (CATHETERS) IMPLANT
CATH URET 5FR 28IN OPEN ENDED (CATHETERS) IMPLANT
DRAPE CAMERA CLOSED 9X96 (DRAPES) ×3 IMPLANT
ELECT BUTTON HF 24-28F 2 30DE (ELECTRODE) ×1 IMPLANT
ELECT LOOP MED HF 24F 12D (CUTTING LOOP) IMPLANT
ELECT LOOP MED HF 24F 12D CBL (CLIP) ×3 IMPLANT
ELECT RESECT VAPORIZE 12D CBL (ELECTRODE) IMPLANT
GLOVE SURG SS PI 8.0 STRL IVOR (GLOVE) ×2 IMPLANT
GOWN STRL REUS W/TWL XL LVL3 (GOWN DISPOSABLE) ×3 IMPLANT
HOLDER FOLEY CATH W/STRAP (MISCELLANEOUS) ×2 IMPLANT
KIT ASPIRATION TUBING (SET/KITS/TRAYS/PACK) IMPLANT
MANIFOLD NEPTUNE II (INSTRUMENTS) ×3 IMPLANT
PACK CYSTO (CUSTOM PROCEDURE TRAY) ×3 IMPLANT
PLUG CATH AND CAP STER (CATHETERS) ×2 IMPLANT
SUT ETHILON 3 0 PS 1 (SUTURE) IMPLANT
SYR 30ML LL (SYRINGE) IMPLANT
SYRINGE IRR TOOMEY STRL 70CC (SYRINGE) ×2 IMPLANT
TUBING CONNECTING 10 (TUBING) ×2 IMPLANT
TUBING CONNECTING 10' (TUBING) ×1

## 2014-06-24 NOTE — Interval H&P Note (Signed)
History and Physical Interval Note:  He had T1 HGNMIBC and is to have a restaging TURBT and instillation of [facility] : [facility].   06/24/2014 11:32 AM  Stefanie Libel  has presented today for surgery, with the diagnosis of BLADDER TUMOR  The various methods of treatment have been discussed with the patient and family. After consideration of risks, benefits and other options for treatment, the patient has consented to  Procedure(s): TRANSURETHRAL RESECTION OF BLADDER TUMOR (TURBT) MITOMYCIN C (N/A) as a surgical intervention .  The patient's history has been reviewed, patient examined, no change in status, stable for surgery.  I have reviewed the patient's chart and labs.  Questions were answered to the patient's satisfaction.     Jamisyn Langer J

## 2014-06-24 NOTE — Transfer of Care (Signed)
Immediate Anesthesia Transfer of Care Note  Patient: [name]  Procedure(s) Performed: Procedure(s): TRANSURETHRAL RESECTION OF BLADDER TUMOR (TURBT) (N/A)  Patient Location: PACU  Anesthesia Type:General  Level of Consciousness: awake, alert , oriented and patient cooperative  Airway & Oxygen Therapy: Patient Spontanous Breathing and Patient connected to face mask oxygen  Post-op Assessment: Report given to PACU RN, Post -op Vital signs reviewed and stable and Patient moving all extremities X 4  Post vital signs: stable  Complications: No apparent anesthesia complications

## 2014-06-24 NOTE — Plan of Care (Signed)
Problem: Consults Goal: TURP/TURBT Patient Education (See Patient Education module for education specifics.) Outcome: Completed/Met Date Met:  06/24/14 Goal: Skin Care Protocol Initiated - if Braden Score 18 or less If consults are not indicated, leave blank or document N/A Outcome: Completed/Met Date Met:  06/24/14 Goal: Nutrition Consult-if indicated Outcome: Completed/Met Date Met:  06/24/14

## 2014-06-24 NOTE — Op Note (Signed)
NAMESHEEHAN, STACEY                ACCOUNT NO.:  [id number]  MEDICAL RECORD NO.:  [id number]  LOCATION:  1414                         FACILITY:  [facility]  PHYSICIAN:  Marshall Cork. Jeffie Pollock, M.D.    DATE OF BIRTH:  [birth year]  DATE OF PROCEDURE:  06/24/2014 DATE OF DISCHARGE:                              OPERATIVE REPORT   PROCEDURE:  Transurethral resection of medium bladder tumor. Instillation of mitomycin-C (in PACU).  PREOPERATIVE DIAGNOSIS:  History of high-grade non muscle invasive possible T1 bladder tumor.  POSTOPERATIVE DIAGNOSIS:  History of high-grade non muscle invasive possible T1 bladder tumor.  SURGEON:  Marshall Cork. Jeffie Pollock, MD  ANESTHESIA:  General.  SPECIMEN:  Bladder tumor chips.  DRAINS:  A 20-French Foley catheter.  BLOOD LOSS:  Minimal.  COMPLICATIONS:  None.  INDICATIONS:  Mr. Cullens is a 80 year old African-American male, who recently underwent resection of a bladder tumor, and he was found to have high-grade possible T1 non muscle invasive bladder cancer and per guidelines, a second resection was felt to be indicated.  FINDINGS OF PROCEDURE:  He was taken to the operating room where he was given Cipro.  A general anesthetic was induced.  He was placed in lithotomy position.  His perineum and genitalia were prepped with Betadine solution.  He was draped in usual sterile fashion.  The resectoscope was placed using the visual obturator and 12-degree lens and this was then fitted with an Beatrix Fetters handle with a bipolar loop and 12-degree lens.  Saline was used as an irrigant.  The prior tumor bed was identified and was approximately 3 x 5 cm.  The entire tumor bed was then re-resected and the chips were collected and hemostasis was achieved.  Final inspection revealed no retained chips, no active bleeding.  The ureteral orifices were away from the resection bed.  The bladder was then drained.  The scope was removed.  A 20-French Foley catheter was inserted.  The  balloon was filled with 10 mL sterile fluid.  The patient was taken down from lithotomy position.  His anesthetic was reversed.  He was moved to recovery room in stable condition.  In the recovery room, mitomycin-C was ordered and his bladder was instilled with 40 mg of mitomycin-C and 40 mL of diluent.  This was left indwelling for 1 hour prior to drainage.  There were no complications.     Marshall Cork. Jeffie Pollock, M.D.     JJW/MEDQ  D:  06/24/2014  T:  06/24/2014  Job:  [id number]

## 2014-06-24 NOTE — Brief Op Note (Signed)
06/24/2014  12:14 PM  PATIENT:  [name]  80 y.o. male  PRE-OPERATIVE DIAGNOSIS:  BLADDER TUMOR Medium  POST-OPERATIVE DIAGNOSIS:  Bladder tumor Medium PROCEDURE:  Procedure(s): TRANSURETHRAL RESECTION OF BLADDER TUMOR (TURBT) (N/A)2-5cm  SURGEON:  Surgeon(s) and Role:    * [name], MD - Primary  PHYSICIAN ASSISTANT:   ASSISTANTS: none   ANESTHESIA:   general  EBL:     BLOOD ADMINISTERED:none  DRAINS: Urinary Catheter (Foley)   LOCAL MEDICATIONS USED:  NONE  SPECIMEN:  Source of Specimen:  bladder chips from tumor bed  DISPOSITION OF SPECIMEN:  PATHOLOGY  COUNTS:  YES  TOURNIQUET:  * No tourniquets in log *  DICTATION: .Other Dictation: Dictation Number [id number]  PLAN OF CARE: Admit for overnight observation  PATIENT DISPOSITION:  PACU - hemodynamically stable.   Delay start of Pharmacological VTE agent (>24hrs) due to surgical blood loss or risk of bleeding: yes

## 2014-06-24 NOTE — Discharge Instructions (Signed)

## 2014-06-24 NOTE — Anesthesia Preprocedure Evaluation (Addendum)
Anesthesia Evaluation  Patient identified by MRN, date of birth, ID band Patient awake    Reviewed: Allergy & Precautions, H&P , NPO status , Patient's Chart, lab work & pertinent test results  Airway Mallampati: II  TM Distance: >3 FB Neck ROM: Full    Dental no notable dental hx.    Pulmonary neg pulmonary ROS, former smoker,  breath sounds clear to auscultation  Pulmonary exam normal       Cardiovascular hypertension, Pt. on medications and Pt. on home beta blockers Rhythm:Regular Rate:Normal     Neuro/Psych negative neurological ROS  negative psych ROS   GI/Hepatic Neg liver ROS, GERD-  Medicated and Controlled,  Endo/Other  diabetes  Renal/GU negative Renal ROS  negative genitourinary   Musculoskeletal negative musculoskeletal ROS (+)   Abdominal   Peds negative pediatric ROS (+)  Hematology negative hematology ROS (+)   Anesthesia Other Findings   Reproductive/Obstetrics negative OB ROS                            Anesthesia Physical Anesthesia Plan  ASA: III  Anesthesia Plan: General   Post-op Pain Management:    Induction: Intravenous  Airway Management Planned: LMA  Additional Equipment:   Intra-op Plan:   Post-operative Plan: Extubation in OR  Informed Consent: I have reviewed the patients History and Physical, chart, labs and discussed the procedure including the risks, benefits and alternatives for the proposed anesthesia with the patient or authorized representative who has indicated his/her understanding and acceptance.   Dental advisory given  Plan Discussed with: CRNA and Surgeon  Anesthesia Plan Comments:         Anesthesia Quick Evaluation

## 2014-06-24 NOTE — H&P (View-Only) (Signed)
Active Problems Problems  1. Benign prostatic hyperplasia with urinary obstruction (N40.1) 2. Constipation (K59.00) 3. 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 Erectile dysfunction due to arterial insufficiency (N52.01) 5. Gross hematuria (R31.0) 6. Malignant neoplasm of lateral wall of bladder (C67.2) 7. Nocturia (R35.1) 8. Post-traumatic stress disorder (F43.10) 9. Renal cyst, acquired (N28.1) 10. Urge incontinence of urine (N39.41)  History of Present Illness [name] returns today in f/u. He had the onset of gross hematuria 2 weeks ago. He has had no new voiding complaints or pain with the bleeding. It was heavy at first but it has improved with fluid intake.  He has a history of BPH with BOO and a small capacity unstable bladder with UUI.  He is also on finasteride and terazosin.  He has ED and was on Viagra but hasn't been active lately. His last PSA was 1.35 from Dr. Maudie Mejia.   Past Medical History Problems  1. History of Anxiety (F41.9) 2. History of Arthritis 3. History of Colon Cancer 4. History of depression (Z86.59) 5. History of diabetes mellitus (Z86.39) 6. History of hypertension (Z86.79)  Surgical History Problems  1. History of Arthroscopy Knee 2. History of Ear Surgery 3. History of Hand Repair 4. History of Knee Replacement 5. History of Partial Colectomy  Current Meds 1. Acetaminophen TABS;  Therapy: (Recorded:28Apr2014) to Recorded 2. AmLODIPine Besylate 5 MG Oral Tablet;  Therapy: (Recorded:28Apr2014) to Recorded 3. Aspirin 81 MG Oral Tablet;  Therapy: (Recorded:28Apr2014) to Recorded 4. Atenolol 25 MG Oral Tablet;  Therapy: (Recorded:28Apr2014) to Recorded 5. B-12 50 MCG Oral Tablet;  Therapy: 07Oct2015 to Recorded 6. Calcium + D TABS;  Therapy: (Recorded:02Feb2009) to Recorded 7. Finasteride 5 MG Oral Tablet; Take 1 tablet daily; Last Rx:22Dec2014 Ordered 8. Iron TABS;  Therapy: (Recorded:18Oct2013) to Recorded 9. MetFORMIN HCl - 500 MG Oral Tablet;   Therapy: (Recorded:06Mar2012) to Recorded 10. Omeprazole 20 MG Oral Tablet Delayed Release;   Therapy: (Recorded:02Feb2009) to Recorded 11. Oxybutynin Chloride ER 10 MG Oral Tablet Extended Release 24 Hour; take 1 tablet by   mouth once daily;   Therapy: [phone] to (Evaluate:17Dec2015); Last Rx:22Dec2014 Ordered 12. Terazosin HCl - 2 MG Oral Capsule; TAKE 1 CAPSULE AT BEDTIME NIGHTLY; Last   Rx:22Dec2014 Ordered 13. Venlafaxine HCl ER TB24;   Therapy: (Recorded:18Oct2013) to Recorded 14. Viagra 100 MG Oral Tablet; TAKE ONE TABLET BY MOUTH ONE HOUR BEFORE   NEEDED, DO NOT TAKE MORE THANONE TABLET IN 24 HOURS;   Therapy: 21Jul2008 to (Evaluate:21Apr2015)  Requested for: 22Dec2014; Last   Rx:22Dec2014 Ordered 15. Vitamin D TABS;   Therapy: (Recorded:18Oct2013) to Recorded  Allergies Medication  1. No Known Drug Allergies  Family History Problems  1. Family history of Cancer : Father 2. Family history of Cancer : Mother 3. Family history of Cancer : Sister 4. Family history of Prostate Cancer : Brother  Social History Problems  1. Family history of Death In The Family Father   age 32 old age 31. Family history of Death In The Family Mother   age 33 old age 39. Marital History - Currently Married 4. Occupation:   retired [place] [employer] 5. History of Tobacco Use   1 pack, 35 years, quit 35 years ago  Review of Systems Genitourinary, constitutional, skin, eye, otolaryngeal, hematologic/lymphatic, cardiovascular, pulmonary, endocrine, musculoskeletal, gastrointestinal, neurological and psychiatric system(s) were reviewed and pertinent findings if present are noted.  Genitourinary: incontinence and hematuria, but no dysuria.  Gastrointestinal: nausea, heartburn and constipation, but no flank pain   The patient presents  with complaints of vomiting (that began after a colonoscopy).  Constitutional: recent 10 lb weight loss, but no fever.    Vitals Vital Signs [Data Includes: Last 1  Day]  Recorded: 07Oct2015 09:02AM  Blood Pressure: 150 / 97 Temperature: 96.9 F Heart Rate: 74  Physical Exam Constitutional: Well nourished and well developed . No acute distress.  Pulmonary: No respiratory distress and normal respiratory rhythm and effort.  Cardiovascular: Heart rate and rhythm are normal . No peripheral edema.  Abdomen: The abdomen is rounded. No masses are palpated. The abdomen is not firm, no rebound and no guarding. Mild. Tenderness is present diffusely throughout the abdomen. No CVA tenderness. No hernias are palpable. No hepatosplenomegaly noted.    Results/Data Urine [Data Includes: Last 1 Day]   07Oct2015  COLOR AMBER   APPEARANCE CLOUDY   SPECIFIC GRAVITY 1.015   pH 7.0   GLUCOSE NEG mg/dL  BILIRUBIN NEG   KETONE NEG mg/dL  BLOOD LARGE   PROTEIN NEG mg/dL  UROBILINOGEN 1 mg/dL  NITRITE NEG   LEUKOCYTE ESTERASE NEG   SQUAMOUS EPITHELIAL/HPF FEW   WBC 0-2 WBC/hpf  RBC 21-50 RBC/hpf  BACTERIA NONE SEEN   CRYSTALS NONE SEEN   CASTS NONE SEEN    The following images/tracing/specimen were independently visualized:  CT films reviewed. Report is pendiing. He has bilateral renal cysts and a 2-3cm bladder tumor in the right bladder.  The following clinical lab reports were reviewed:  UA reviewed. Selected Results  BUN & CREATININE 07Oct2015 09:38AM [name]  SPECIMEN TYPE: BLOOD   Test Name Result Flag Reference  CREATININE 1.10 mg/dL  0.50-1.50  BUN 16 mg/dL  6-30  Est GFR, African American 73 mL/min    Est GFR, NonAfrican American 64 mL/min    PERFORMED AT:        [facility]                      [address]                      [place], [place] [address]   THE ESTIMATED GFR IS A CALCULATION VALID FOR ADULTS (>=55 YEARS OLD) THAT USES THE CKD-EPI ALGORITHM TO ADJUST FOR AGE AND SEX. IT IS   NOT TO BE USED FOR CHILDREN, PREGNANT WOMEN, HOSPITALIZED PATIENTS,    PATIENTS ON DIALYSIS, OR WITH RAPIDLY CHANGING KIDNEY  FUNCTION. ACCORDING TO THE NKDEP, EGFR >89 IS NORMAL, 60-89 SHOWS MILD IMPAIRMENT, 30-59 SHOWS MODERATE IMPAIRMENT, 15-29 SHOWS SEVERE IMPAIRMENT AND <15 IS ESRD.   Procedure  Procedure: Cystoscopy   Indication: Hematuria. History of Urothelial Carcinoma.  Informed Consent: Risks, benefits, and potential adverse events were discussed and informed consent was obtained from the patient.  Prep: The patient was prepped with betadine.  Antibiotic prophylaxis: Ciprofloxacin.  Procedure Note:  Urethral meatus:. No abnormalities.  Anterior urethra: No abnormalities.  Prostatic urethra: No abnormalities . Estimated length was 3 cm. There was visual obstruction of the prostatic urethra. The lateral and median prostatic lobes were enlarged. An enlarged intravesical median lobe was visualized.  Bladder: Visulization was clear. The ureteral orifices were in the normal anatomic position bilaterally and had clear efflux of urine. The mucosa was smooth without abnormalities. Examination of the bladder demonstrated mild trabeculation. A solitary tumor was visualized in the bladder. A papillary tumor was seen in the bladder measuring approximately 2 cm in size. This tumor was located on the right side of the bladder.  The patient tolerated the procedure well.  Complications: None.    Assessment Assessed  1. Gross hematuria (R31.0) 2. Benign prostatic hyperplasia with urinary obstruction (N40.1) 3. Urge incontinence of urine (N39.41) 4. Malignant neoplasm of lateral wall of bladder (C67.2) 5. Renal cyst, acquired (N28.1)  He has a 2 cm papillary tumor of the right lateral wall.   He ahs BPH with BOO.  He has bilateral renal cysts with a large cyst on the right.   Plan Gross hematuria  1. AU CT-HEMATURIA PROTOCOL; Status:In Progress - Specimen/Data Collected;   Done:  07Oct2015 12:00AM 2. BUN & CREATININE; Status:Resulted - Requires Verification;   Done: 07Oct2015 09:38AM 3. Cysto; Status:Hold For  - Appointment,Date of Service; Requested for:07Oct2015;  4. VENIPUNCTURE; Status:Complete;   Done: [id number] Health Maintenance  5. UA With REFLEX; [Do Not Release]; Status:Resulted - Requires Verification;   Done:  07Oct2015 08:49AM Malignant neoplasm of lateral wall of bladder  6. URINE CYTOLOGY; Status:Hold For - Specimen/Data Collection,Appointment;  Requested [id number];  7. Follow-up Schedule Surgery Office  Follow-up  Status: Hold For - Appointment   Requested for: 07Oct2015  I am going to get him set up for a TURBT with [place] instillation. I have reviewed the risks of bleeding, infection, bladder injury, strictures, chemical cystitis, thrombotic events and anesthetic complications.   Discussion/Summary CC: Dr. Jani Gravel.    CC: Dr. Jani Gravel.

## 2014-06-24 NOTE — Progress Notes (Signed)
Patient ID: [name], male   DOB: 1932/08/16, 80 y.o.   MRN: [id number]  He is doing well post op without complaints.  Urine is dark pink.  Foley out and home in am.

## 2014-06-24 NOTE — Anesthesia Postprocedure Evaluation (Signed)
  Anesthesia Post-op Note  Patient: [name]  Procedure(s) Performed: Procedure(s) (LRB): TRANSURETHRAL RESECTION OF BLADDER TUMOR (TURBT) (N/A)  Patient Location: PACU  Anesthesia Type: General  Level of Consciousness: awake and alert   Airway and Oxygen Therapy: Patient Spontanous Breathing  Post-op Pain: mild  Post-op Assessment: Post-op Vital signs reviewed, Patient's Cardiovascular Status Stable, Respiratory Function Stable, Patent Airway and No signs of Nausea or vomiting  Last Vitals:  Filed Vitals:   06/24/14 1230  BP: 97/55  Pulse: 55  Temp: 35.9 C  Resp: 18    Post-op Vital Signs: stable   Complications: No apparent anesthesia complications

## 2014-06-25 ENCOUNTER — Encounter (HOSPITAL_COMMUNITY): Payer: Self-pay | Admitting: Urology

## 2014-06-25 DIAGNOSIS — D090 Carcinoma in situ of bladder: Secondary | ICD-10-CM | POA: Diagnosis not present

## 2014-06-25 LAB — GLUCOSE, CAPILLARY: Glucose-Capillary: 87 mg/dL (ref 70–99)

## 2014-06-25 NOTE — Discharge Summary (Signed)
Physician Discharge Summary  Patient ID: [name] MRN: [id number] DOB/AGE: 1932/12/06 80 y.o.  Admit date: 06/24/2014 Discharge date: 06/25/2014  Admission Diagnoses:  Bladder cancer  Discharge Diagnoses:  Principal Problem:   Bladder cancer   Past Medical History  Diagnosis Date  . History of colon cancer   . Hypertension   . Osteoporosis   . Peripheral neuropathy   . Allergic rhinitis   . BPH (benign prostatic hypertrophy)     FREQUENT URINATION   . Posttraumatic stress disorder   . DJD (degenerative joint disease)   . Dyspepsia   . DM2 (diabetes mellitus, type 2)   . Hearing loss     bilateral  . Cataract     bilateral extrraction and lens implants '88-,'89  . Tubular adenoma 02/17/2008  . Diverticulosis   . Anemia   . Cancer   . GERD (gastroesophageal reflux disease)   . Depression   . Glaucoma   . Glaucoma   . Nonsustained ventricular tachycardia     PAST HISTORY  . Bladder tumor     WAS HAVING BLOOD IN URINE - CLEARED BUT OCCAS CLOT NOW  . Restless leg syndrome   . Multiple pulmonary nodules     SEEN ON CT CHEST DONE 05/25/14 AT [facility]    Surgeries: Procedure(s): TRANSURETHRAL RESECTION OF BLADDER TUMOR (TURBT) on 06/24/2014   Consultants (if any):    Discharged Condition: Improved  Hospital Course: [name] is an 80 y.o. male who was admitted 06/24/2014 with a diagnosis of Bladder cancer and went to the operating room on 06/24/2014 and underwent the above named procedures.  [facility] was instilled in the PACU.   His foley was removed this morning and he will be discharged hom when voiding.   He was given perioperative antibiotics:      Anti-infectives    Start     Dose/Rate Route Frequency Ordered Stop   06/24/14 0725  ciprofloxacin (CIPRO) IVPB 400 mg     400 mg200 mL/hr over 60 Minutes Intravenous 60 min pre-op 06/24/14 0725 06/24/14 1141    .  He was given sequential compression devices for DVT prophylaxis.  He  benefited maximally from the hospital stay and there were no complications.    Recent vital signs:  Filed Vitals:   06/25/14 0456  BP: 92/53  Pulse: 61  Temp: 97.9 F (36.6 C)  Resp: 20    Recent laboratory studies:  Lab Results  Component Value Date   HGB 14.7 05/28/2014   HGB 13.2* 09/04/2012   HGB 12.7* 06/10/2012   Lab Results  Component Value Date   WBC 3.8* 05/28/2014   PLT 211 05/28/2014   No results found for: INR Lab Results  Component Value Date   NA 143 05/28/2014   K 4.3 05/28/2014   CL 102 05/28/2014   CO2 29 05/28/2014   BUN 21 05/28/2014   CREATININE 1.04 05/28/2014   GLUCOSE 80 05/28/2014    Discharge Medications:     Medication List    TAKE these medications        acetaminophen 325 MG tablet  Commonly known as:  TYLENOL  Take 650 mg by mouth every 6 (six) hours as needed for mild pain. Take 2 tablets by mouth every 6 hours as needed for pain or fever.     ALIGN 4 MG Caps  Take 1 capsule by mouth daily.     ammonium lactate 12 % lotion  Commonly known as:  LAC-HYDRIN  Apply 1 application topically daily as needed for dry skin.     aspirin 81 MG tablet  Take 81 mg by mouth daily.     atenolol 25 MG tablet  Commonly known as:  TENORMIN  Take 25 mg by mouth every morning.     atorvastatin 20 MG tablet  Commonly known as:  LIPITOR  Take 10 mg by mouth at bedtime.     bisacodyl 5 MG EC tablet  Commonly known as:  DULCOLAX  Take 10 mg by mouth 2 (two) times daily as needed for mild constipation.     CALCIUM 500 + D PO  Take 1 tablet by mouth daily.     cetirizine 10 MG tablet  Commonly known as:  ZYRTEC  Take 10 mg by mouth daily.     clonazePAM 0.5 MG tablet  Commonly known as:  KLONOPIN  Take 0.5 mg by mouth every 6 (six) hours as needed for anxiety.     cyanocobalamin 1000 MCG/ML injection  Commonly known as:  (VITAMIN B-12)  Inject 1,000 mcg into the muscle every 28 (twenty-eight) days.     VITAMIN B-12 PO  Take 1  tablet by mouth daily.     finasteride 5 MG tablet  Commonly known as:  PROSCAR  Take 5 mg by mouth at bedtime.     gabapentin 100 MG capsule  Commonly known as:  NEURONTIN  Take 100 mg by mouth 3 (three) times daily.     guaifenesin 400 MG Tabs tablet  Commonly known as:  HUMIBID E  Take 400 mg by mouth every 4 (four) hours as needed (FOR CONGESTION).     HYDROcodone-acetaminophen 5-325 MG per tablet  Commonly known as:  NORCO  Take 1 tablet by mouth every 6 (six) hours as needed for moderate pain.     lactose free nutrition Liqd  Take 237 mLs by mouth daily.     latanoprost 0.005 % ophthalmic solution  Commonly known as:  XALATAN  Place 1 drop into the right eye at bedtime.     lisinopril-hydrochlorothiazide 20-12.5 MG per tablet  Commonly known as:  PRINZIDE,ZESTORETIC  Take 1 tablet by mouth every morning.     MARINE LIPID CONCENTRATE PO  Take 1 capsule by mouth daily.     metFORMIN 500 MG tablet  Commonly known as:  GLUCOPHAGE  Take 500 mg by mouth 2 (two) times daily with a meal.     omeprazole 20 MG capsule  Commonly known as:  PRILOSEC  Take 20 mg by mouth 2 (two) times daily.     OVER THE COUNTER MEDICATION  Take 1 capsule by mouth daily.     oxybutynin 10 MG 24 hr tablet  Commonly known as:  DITROPAN-XL  Take 10 mg by mouth daily.     terazosin 2 MG capsule  Commonly known as:  HYTRIN  Take 2 mg by mouth at bedtime.     venlafaxine XR 75 MG 24 hr capsule  Commonly known as:  EFFEXOR-XR  Take 75 mg by mouth 2 (two) times daily.     Vitamin D-3 1000 UNITS Caps  Take 1 capsule by mouth daily.        Diagnostic Studies: No results found.  Disposition: 01-Home or Self Care  Discharge Instructions    Discontinue IV    Complete by:  As directed            Follow-up Information    Follow up with [name],  MD.   Specialty:  Urology   Why:  as scheduled   Contact information:   [place] [address] [phone]         Signed: [name] 06/25/2014, 7:02 AM

## 2014-07-12 DIAGNOSIS — D090 Carcinoma in situ of bladder: Secondary | ICD-10-CM | POA: Diagnosis not present

## 2014-07-13 DIAGNOSIS — C679 Malignant neoplasm of bladder, unspecified: Secondary | ICD-10-CM | POA: Diagnosis not present

## 2014-07-13 DIAGNOSIS — Z5111 Encounter for antineoplastic chemotherapy: Secondary | ICD-10-CM | POA: Diagnosis not present

## 2014-07-21 DIAGNOSIS — R310 Gross hematuria: Secondary | ICD-10-CM | POA: Diagnosis not present

## 2014-07-28 DIAGNOSIS — Z5111 Encounter for antineoplastic chemotherapy: Secondary | ICD-10-CM | POA: Diagnosis not present

## 2014-07-28 DIAGNOSIS — D090 Carcinoma in situ of bladder: Secondary | ICD-10-CM | POA: Diagnosis not present

## 2014-08-04 DIAGNOSIS — Z5111 Encounter for antineoplastic chemotherapy: Secondary | ICD-10-CM | POA: Diagnosis not present

## 2014-08-04 DIAGNOSIS — C672 Malignant neoplasm of lateral wall of bladder: Secondary | ICD-10-CM | POA: Diagnosis not present

## 2014-08-11 DIAGNOSIS — C672 Malignant neoplasm of lateral wall of bladder: Secondary | ICD-10-CM | POA: Diagnosis not present

## 2014-08-11 DIAGNOSIS — Z5111 Encounter for antineoplastic chemotherapy: Secondary | ICD-10-CM | POA: Diagnosis not present

## 2014-08-18 DIAGNOSIS — Z5111 Encounter for antineoplastic chemotherapy: Secondary | ICD-10-CM | POA: Diagnosis not present

## 2014-08-18 DIAGNOSIS — C672 Malignant neoplasm of lateral wall of bladder: Secondary | ICD-10-CM | POA: Diagnosis not present

## 2014-08-19 DIAGNOSIS — M810 Age-related osteoporosis without current pathological fracture: Secondary | ICD-10-CM | POA: Diagnosis not present

## 2014-08-19 DIAGNOSIS — I10 Essential (primary) hypertension: Secondary | ICD-10-CM | POA: Diagnosis not present

## 2014-08-19 DIAGNOSIS — E119 Type 2 diabetes mellitus without complications: Secondary | ICD-10-CM | POA: Diagnosis not present

## 2014-08-19 DIAGNOSIS — N390 Urinary tract infection, site not specified: Secondary | ICD-10-CM | POA: Diagnosis not present

## 2014-08-25 DIAGNOSIS — C672 Malignant neoplasm of lateral wall of bladder: Secondary | ICD-10-CM | POA: Diagnosis not present

## 2014-08-27 DIAGNOSIS — E119 Type 2 diabetes mellitus without complications: Secondary | ICD-10-CM | POA: Diagnosis not present

## 2014-08-27 DIAGNOSIS — Z9849 Cataract extraction status, unspecified eye: Secondary | ICD-10-CM | POA: Diagnosis not present

## 2014-08-27 DIAGNOSIS — H4011X2 Primary open-angle glaucoma, moderate stage: Secondary | ICD-10-CM | POA: Diagnosis not present

## 2014-08-27 DIAGNOSIS — H11153 Pinguecula, bilateral: Secondary | ICD-10-CM | POA: Diagnosis not present

## 2014-08-27 DIAGNOSIS — H18413 Arcus senilis, bilateral: Secondary | ICD-10-CM | POA: Diagnosis not present

## 2014-09-07 DIAGNOSIS — R942 Abnormal results of pulmonary function studies: Secondary | ICD-10-CM | POA: Diagnosis not present

## 2014-09-07 DIAGNOSIS — C672 Malignant neoplasm of lateral wall of bladder: Secondary | ICD-10-CM | POA: Diagnosis not present

## 2014-09-07 DIAGNOSIS — J439 Emphysema, unspecified: Secondary | ICD-10-CM | POA: Diagnosis not present

## 2014-09-08 DIAGNOSIS — E118 Type 2 diabetes mellitus with unspecified complications: Secondary | ICD-10-CM | POA: Diagnosis not present

## 2014-09-08 DIAGNOSIS — I10 Essential (primary) hypertension: Secondary | ICD-10-CM | POA: Diagnosis not present

## 2014-09-08 DIAGNOSIS — F4310 Post-traumatic stress disorder, unspecified: Secondary | ICD-10-CM | POA: Diagnosis not present

## 2014-09-30 DIAGNOSIS — E118 Type 2 diabetes mellitus with unspecified complications: Secondary | ICD-10-CM | POA: Diagnosis not present

## 2014-09-30 DIAGNOSIS — C679 Malignant neoplasm of bladder, unspecified: Secondary | ICD-10-CM | POA: Diagnosis not present

## 2014-09-30 DIAGNOSIS — F4310 Post-traumatic stress disorder, unspecified: Secondary | ICD-10-CM | POA: Diagnosis not present

## 2014-09-30 DIAGNOSIS — I10 Essential (primary) hypertension: Secondary | ICD-10-CM | POA: Diagnosis not present

## 2014-12-01 DIAGNOSIS — N138 Other obstructive and reflux uropathy: Secondary | ICD-10-CM | POA: Diagnosis not present

## 2014-12-01 DIAGNOSIS — Z86008 Personal history of in-situ neoplasm of other site: Secondary | ICD-10-CM | POA: Diagnosis not present

## 2014-12-01 DIAGNOSIS — N401 Enlarged prostate with lower urinary tract symptoms: Secondary | ICD-10-CM | POA: Diagnosis not present

## 2014-12-01 DIAGNOSIS — N3941 Urge incontinence: Secondary | ICD-10-CM | POA: Diagnosis not present

## 2014-12-01 DIAGNOSIS — D090 Carcinoma in situ of bladder: Secondary | ICD-10-CM | POA: Diagnosis not present

## 2014-12-03 DIAGNOSIS — Z9849 Cataract extraction status, unspecified eye: Secondary | ICD-10-CM | POA: Diagnosis not present

## 2014-12-03 DIAGNOSIS — I10 Essential (primary) hypertension: Secondary | ICD-10-CM | POA: Diagnosis not present

## 2014-12-03 DIAGNOSIS — H11153 Pinguecula, bilateral: Secondary | ICD-10-CM | POA: Diagnosis not present

## 2014-12-03 DIAGNOSIS — H18413 Arcus senilis, bilateral: Secondary | ICD-10-CM | POA: Diagnosis not present

## 2014-12-03 DIAGNOSIS — H401232 Low-tension glaucoma, bilateral, moderate stage: Secondary | ICD-10-CM | POA: Diagnosis not present

## 2014-12-03 DIAGNOSIS — E119 Type 2 diabetes mellitus without complications: Secondary | ICD-10-CM | POA: Diagnosis not present

## 2014-12-03 DIAGNOSIS — H524 Presbyopia: Secondary | ICD-10-CM | POA: Diagnosis not present

## 2014-12-03 DIAGNOSIS — H52223 Regular astigmatism, bilateral: Secondary | ICD-10-CM | POA: Diagnosis not present

## 2014-12-15 DIAGNOSIS — N508 Other specified disorders of male genital organs: Secondary | ICD-10-CM | POA: Diagnosis not present

## 2014-12-15 DIAGNOSIS — I723 Aneurysm of iliac artery: Secondary | ICD-10-CM | POA: Diagnosis not present

## 2014-12-15 DIAGNOSIS — N401 Enlarged prostate with lower urinary tract symptoms: Secondary | ICD-10-CM | POA: Diagnosis not present

## 2014-12-16 DIAGNOSIS — E119 Type 2 diabetes mellitus without complications: Secondary | ICD-10-CM | POA: Diagnosis not present

## 2014-12-16 DIAGNOSIS — I10 Essential (primary) hypertension: Secondary | ICD-10-CM | POA: Diagnosis not present

## 2014-12-16 DIAGNOSIS — G589 Mononeuropathy, unspecified: Secondary | ICD-10-CM | POA: Diagnosis not present

## 2015-01-06 DIAGNOSIS — Z86008 Personal history of in-situ neoplasm of other site: Secondary | ICD-10-CM | POA: Diagnosis not present

## 2015-01-06 DIAGNOSIS — C672 Malignant neoplasm of lateral wall of bladder: Secondary | ICD-10-CM | POA: Diagnosis not present

## 2015-01-20 DIAGNOSIS — M79642 Pain in left hand: Secondary | ICD-10-CM | POA: Diagnosis not present

## 2015-01-20 DIAGNOSIS — G589 Mononeuropathy, unspecified: Secondary | ICD-10-CM | POA: Diagnosis not present

## 2015-01-20 DIAGNOSIS — M79641 Pain in right hand: Secondary | ICD-10-CM | POA: Diagnosis not present

## 2015-01-20 DIAGNOSIS — M545 Low back pain: Secondary | ICD-10-CM | POA: Diagnosis not present

## 2015-03-09 DIAGNOSIS — N401 Enlarged prostate with lower urinary tract symptoms: Secondary | ICD-10-CM | POA: Diagnosis not present

## 2015-03-09 DIAGNOSIS — N3941 Urge incontinence: Secondary | ICD-10-CM | POA: Diagnosis not present

## 2015-03-09 DIAGNOSIS — R351 Nocturia: Secondary | ICD-10-CM | POA: Diagnosis not present

## 2015-03-09 DIAGNOSIS — Z86008 Personal history of in-situ neoplasm of other site: Secondary | ICD-10-CM | POA: Diagnosis not present

## 2015-03-09 DIAGNOSIS — N138 Other obstructive and reflux uropathy: Secondary | ICD-10-CM | POA: Diagnosis not present

## 2015-03-22 DIAGNOSIS — R5381 Other malaise: Secondary | ICD-10-CM | POA: Diagnosis not present

## 2015-03-22 DIAGNOSIS — E559 Vitamin D deficiency, unspecified: Secondary | ICD-10-CM | POA: Diagnosis not present

## 2015-03-22 DIAGNOSIS — E119 Type 2 diabetes mellitus without complications: Secondary | ICD-10-CM | POA: Diagnosis not present

## 2015-03-22 DIAGNOSIS — E784 Other hyperlipidemia: Secondary | ICD-10-CM | POA: Diagnosis not present

## 2015-03-22 DIAGNOSIS — I200 Unstable angina: Secondary | ICD-10-CM | POA: Diagnosis not present

## 2015-03-22 DIAGNOSIS — E039 Hypothyroidism, unspecified: Secondary | ICD-10-CM | POA: Diagnosis not present

## 2015-03-22 DIAGNOSIS — E291 Testicular hypofunction: Secondary | ICD-10-CM | POA: Diagnosis not present

## 2015-03-22 DIAGNOSIS — E780 Pure hypercholesterolemia: Secondary | ICD-10-CM | POA: Diagnosis not present

## 2015-03-22 DIAGNOSIS — I10 Essential (primary) hypertension: Secondary | ICD-10-CM | POA: Diagnosis not present

## 2015-03-22 DIAGNOSIS — D519 Vitamin B12 deficiency anemia, unspecified: Secondary | ICD-10-CM | POA: Diagnosis not present

## 2015-03-22 DIAGNOSIS — E069 Thyroiditis, unspecified: Secondary | ICD-10-CM | POA: Diagnosis not present

## 2015-03-22 DIAGNOSIS — R0782 Intercostal pain: Secondary | ICD-10-CM | POA: Diagnosis not present

## 2015-03-28 DIAGNOSIS — Z13820 Encounter for screening for osteoporosis: Secondary | ICD-10-CM | POA: Diagnosis not present

## 2015-03-28 DIAGNOSIS — R310 Gross hematuria: Secondary | ICD-10-CM | POA: Diagnosis not present

## 2015-03-28 DIAGNOSIS — R102 Pelvic and perineal pain: Secondary | ICD-10-CM | POA: Diagnosis not present

## 2015-03-28 DIAGNOSIS — R300 Dysuria: Secondary | ICD-10-CM | POA: Diagnosis not present

## 2015-03-28 DIAGNOSIS — R350 Frequency of micturition: Secondary | ICD-10-CM | POA: Diagnosis not present

## 2015-06-22 DIAGNOSIS — Z86008 Personal history of in-situ neoplasm of other site: Secondary | ICD-10-CM | POA: Diagnosis not present

## 2015-06-22 DIAGNOSIS — N401 Enlarged prostate with lower urinary tract symptoms: Secondary | ICD-10-CM | POA: Diagnosis not present

## 2015-06-22 DIAGNOSIS — N138 Other obstructive and reflux uropathy: Secondary | ICD-10-CM | POA: Diagnosis not present

## 2015-06-22 DIAGNOSIS — R351 Nocturia: Secondary | ICD-10-CM | POA: Diagnosis not present

## 2015-07-14 DIAGNOSIS — R896 Abnormal cytological findings in specimens from other organs, systems and tissues: Secondary | ICD-10-CM | POA: Diagnosis not present

## 2015-07-14 DIAGNOSIS — Z86008 Personal history of in-situ neoplasm of other site: Secondary | ICD-10-CM | POA: Diagnosis not present

## 2015-07-18 ENCOUNTER — Other Ambulatory Visit: Payer: Self-pay | Admitting: Urology

## 2015-07-21 DIAGNOSIS — C672 Malignant neoplasm of lateral wall of bladder: Secondary | ICD-10-CM | POA: Diagnosis not present

## 2015-07-21 DIAGNOSIS — R896 Abnormal cytological findings in specimens from other organs, systems and tissues: Secondary | ICD-10-CM | POA: Diagnosis not present

## 2015-07-21 DIAGNOSIS — Z86008 Personal history of in-situ neoplasm of other site: Secondary | ICD-10-CM | POA: Diagnosis not present

## 2015-07-21 DIAGNOSIS — N400 Enlarged prostate without lower urinary tract symptoms: Secondary | ICD-10-CM | POA: Diagnosis not present

## 2015-07-21 NOTE — Patient Instructions (Addendum)
MARLEE SERMENO  07/21/2015   Your procedure is scheduled on: 07/26/2015    Report to [facility] Main  Entrance take [place]  elevators to 3rd floor to  [place] at    [place] AM.  Call this number if you have problems the morning of surgery [phone]   Remember: ONLY 1 PERSON MAY GO WITH YOU TO SHORT STAY TO GET  READY MORNING OF [place].  Do not eat food or drink liquids :After Midnight.     Take these medicines the morning of surgery with A SIP OF WATER:  Amlodipine ( Noravsc), Atenolol ( Tenormin), Zyrtec, Clonazepam ( Klonopin) if needed, Ditropan, Effexor, Prilosec  DO NOT TAKE ANY DIABETIC MEDICATIONS DAY OF YOUR SURGERY                               You may not have any metal on your body including hair pins and              piercings  Do not wear jewelry, , lotions, powders or perfumes, deodorant                         Men may shave face and neck.   Do not bring valuables to the hospital. [place].  Contacts, dentures or bridgework may not be worn into surgery.       Patients discharged the day of surgery will not be allowed to drive home.  Name and phone number of your driver:  Special Instructions: coughing and deep breathing exercises, leg exercises               Please read over the following fact sheets you were given: _____________________________________________________________________             [facility] - Preparing for Surgery Before surgery, you can play an important role.  Because skin is not sterile, your skin needs to be as free of germs as possible.  You can reduce the number of germs on your skin by washing with CHG (chlorahexidine gluconate) soap before surgery.  CHG is an antiseptic cleaner which kills germs and bonds with the skin to continue killing germs even after washing. Please DO NOT use if you have an allergy to CHG or antibacterial soaps.  If your skin  becomes reddened/irritated stop using the CHG and inform your nurse when you arrive at Short Stay. Do not shave (including legs and underarms) for at least 48 hours prior to the first CHG shower.  You may shave your face/neck. Please follow these instructions carefully:  1.  Shower with CHG Soap the night before surgery and the  morning of Surgery.  2.  If you choose to wash your hair, wash your hair first as usual with your  normal  shampoo.  3.  After you shampoo, rinse your hair and body thoroughly to remove the  shampoo.                           4.  Use CHG as you would any other liquid soap.  You can apply chg directly  to the skin and wash  Gently with a scrungie or clean washcloth.  5.  Apply the CHG Soap to your body ONLY FROM THE NECK DOWN.   Do not use on face/ open                           Wound or open sores. Avoid contact with eyes, ears mouth and genitals (private parts).                       Wash face,  Genitals (private parts) with your normal soap.             6.  Wash thoroughly, paying special attention to the area where your surgery  will be performed.  7.  Thoroughly rinse your body with warm water from the neck down.  8.  DO NOT shower/wash with your normal soap after using and rinsing off  the CHG Soap.                9.  Pat yourself dry with a clean towel.            10.  Wear clean pajamas.            11.  Place clean sheets on your bed the night of your first shower and do not  sleep with pets. Day of Surgery : Do not apply any lotions/deodorants the morning of surgery.  Please wear clean clothes to the hospital/surgery center.  FAILURE TO FOLLOW THESE INSTRUCTIONS MAY RESULT IN THE CANCELLATION OF YOUR SURGERY PATIENT SIGNATURE_________________________________  NURSE SIGNATURE__________________________________  ________________________________________________________________________

## 2015-07-22 ENCOUNTER — Encounter (HOSPITAL_COMMUNITY): Payer: Self-pay

## 2015-07-22 ENCOUNTER — Encounter (HOSPITAL_COMMUNITY)
Admission: RE | Admit: 2015-07-22 | Discharge: 2015-07-22 | Disposition: A | Discharge status: Home / Self Care | Payer: Medicare Other | Source: Ambulatory Visit | Attending: Urology | Admitting: Urology

## 2015-07-22 DIAGNOSIS — Z01812 Encounter for preprocedural laboratory examination: Secondary | ICD-10-CM | POA: Insufficient documentation

## 2015-07-22 DIAGNOSIS — Z01810 Encounter for preprocedural cardiovascular examination: Secondary | ICD-10-CM | POA: Diagnosis not present

## 2015-07-22 HISTORY — DX: Headache, unspecified: R51.9

## 2015-07-22 HISTORY — DX: Headache: R51

## 2015-07-22 HISTORY — DX: Anxiety disorder, unspecified: F41.9

## 2015-07-22 LAB — CBC
HCT: 41.6 % (ref 39.0–52.0)
Hemoglobin: 13.7 g/dL (ref 13.0–17.0)
MCH: 28.9 pg (ref 26.0–34.0)
MCHC: 32.9 g/dL (ref 30.0–36.0)
MCV: 87.8 fL (ref 78.0–100.0)
PLATELETS: 182 10*3/uL (ref 150–400)
RBC: 4.74 MIL/uL (ref 4.22–5.81)
RDW: 13.5 % (ref 11.5–15.5)
WBC: 4.3 10*3/uL (ref 4.0–10.5)

## 2015-07-22 LAB — BASIC METABOLIC PANEL
Anion gap: 7 (ref 5–15)
BUN: 21 mg/dL — ABNORMAL HIGH (ref 6–20)
CALCIUM: 9.7 mg/dL (ref 8.9–10.3)
CO2: 28 mmol/L (ref 22–32)
CREATININE: 1.07 mg/dL (ref 0.61–1.24)
Chloride: 107 mmol/L (ref 101–111)
GFR calc non Af Amer: >60 mL/min (ref >60)
Glucose, Bld: 108 mg/dL — ABNORMAL HIGH (ref 65–99)
Potassium: 3.8 mmol/L (ref 3.5–5.1)
SODIUM: 142 mmol/L (ref 135–145)

## 2015-07-22 NOTE — Progress Notes (Signed)
BMP result done 07/22/15 faxed via EPIC to Dr Jeffie Pollock.

## 2015-07-22 NOTE — Progress Notes (Signed)
Patient has mild hives over upper body and arms.  Patient states he has been using new soap.  Instructed patient that he might want to return to old soap to see if hives disappear.  Also instructed patient that if does not improve to contact PCP.  Patient voiced understanding.

## 2015-07-25 ENCOUNTER — Encounter (HOSPITAL_COMMUNITY): Payer: Self-pay | Admitting: Anesthesiology

## 2015-07-25 NOTE — H&P (Signed)
Active Problems Problems  1. Benign prostatic hyperplasia with urinary obstruction (N40.1,N13.8) 2. Constipation (K59.00) 3. Detrusor instability (N32.81) 4. Erectile dysfunction due to arterial insufficiency (N52.01) 5. History of carcinoma in situ of bladder (Z86.008) 6. Nocturia (R35.1) 7. Post-traumatic stress disorder (F43.10) 8. Pulmonary nodule seen on imaging study (R91.1) 9. Renal cyst, acquired (N28.1) 10. History of Urge incontinence of urine (N39.41)  History of Present Illness [name] returns today in f/u for his recent positive cytology. He had a negative CT in the spring and a negative cystoscopy this month.  He has no voiding complaints or hematuria.   Past Medical History Problems  1. History of Anxiety (F41.9) 2. History of Arthritis 3. History of Colon Cancer 4. History of Gross hematuria (R31.0) 5. History of carcinoma in situ of bladder (Z86.008) 6. History of depression (Z86.59) 7. History of diabetes mellitus (Z86.39) 8. History of hypertension (Z86.79) 9. History of Urge incontinence of urine (N39.41)  Surgical History Problems  1. History of Arthroscopy Knee 2. History of Bladder Injection Of Cancer Treatment 3. History of Bladder Injection Of Cancer Treatment 4. History of Cystoscopy With Fulguration Large Lesion (Over 5cm) 5. History of Cystoscopy With Fulguration Medium Lesion (2-5cm) 6. History of Ear Surgery 7. History of Hand Repair 8. History of Knee Replacement 9. History of Partial Colectomy  Current Meds 1. Acetaminophen TABS;  Therapy: (Recorded:28Apr2014) to Recorded 2. Align;  Therapy: [id number] to Recorded 3. AmLactin LOTN;  Therapy: (Recorded:09Nov2016) to Recorded 4. Aspirin 81 MG TABS;  Therapy: (Recorded:28Apr2014) to Recorded 5. Atenolol 25 MG Oral Tablet;  Therapy: (Recorded:28Apr2014) to Recorded 6. Atorvastatin Calcium 40 MG Oral Tablet;  Therapy: (Recorded:09Nov2016) to Recorded 7. Bisacodyl Laxative SUPP;  Therapy: (Recorded:09Nov2016) to Recorded 8. Finasteride 5 MG Oral Tablet; Take 1 tablet daily; Last Rx:27Jul2016 Ordered 9. Gabapentin 100 MG TABS;  Therapy: (Recorded:09Nov2016) to Recorded 10. Latanoprost 0.005 % Ophthalmic Solution;   Therapy: (Recorded:09Nov2016) to Recorded 11. MetFORMIN HCl - 500 MG Oral Tablet;   Therapy: (Recorded:06Mar2012) to Recorded 12. Omeprazole 20 MG Oral Tablet Delayed Release;   Therapy: (Recorded:02Feb2009) to Recorded 13. Terazosin HCl - 2 MG Oral Capsule;   Therapy: (Recorded:09Nov2016) to Recorded  Allergies Medication  1. No Known Drug Allergies  Family History Problems  1. Family history of Cancer : Father 2. Family history of Cancer : Mother 3. Family history of Cancer : Sister 4. Family history of Prostate Cancer : Brother  Social History Problems  1. Family history of Death In The Family Father   age 60 old age 61. Family history of Death In The Family Mother   age 53 old age 58. Former smoker [phone]) 4. Marital History - Currently Married 5. Occupation:   retired [place] [employer] 6. History of Tobacco Use   1 pack, 35 years, quit 35 years ago  Past history reviewed and updated.   Review of Systems Genitourinary, constitutional, skin, eye, otolaryngeal, hematologic/lymphatic, cardiovascular, pulmonary, endocrine, musculoskeletal, gastrointestinal, neurological and psychiatric system(s) were reviewed and pertinent findings if present are noted and are otherwise negative.  ENT: earache.    Vitals Vital Signs [Data Includes: Last 1 Day]  Recorded: [id number] 01:12PM  Blood Pressure: 168 / 86 Temperature: 97.9 F Heart Rate: 59  Physical Exam Constitutional: Well nourished and well developed . No acute distress.  Pulmonary: No respiratory distress and normal respiratory rhythm and effort.  Cardiovascular: Heart rate and rhythm are normal . No peripheral edema.    Results/Data  The following clinical lab reports were reviewed:  Urine cytology reviewed. Selected Results  URINE CYTOLOGY [id number] 10:43AM [name]  SPECIMEN TYPE: URINE   Test Name Result Flag Reference  FINAL DIAGNOSIS:  A   - CELLS PRESENT SUSPICIOUS FOR MALIGNANCY. SUSPICIOUS FOR HIGH-GRADE UROTHELIAL CARCINOMA.  SOURCE:     Urine: Not Otherwise Specified  70CC OF YELLOW URI RECEIVED IN FIXATIVE 1 SLIDE PREPARED 111016 TF  Relevant Clinical Info     PMH: HISTORY OF CARCINOMA IN SITU OF BLADDER  PATHOLOGIST:     REVIEWED BY VALERIE J. FIELDS, MD, FCAP (ELECTRONIC SIGNATURE ON FILE)  NUMBER OF SLIDES     1 Container Submitted  CYTOTECHNOLOGIST:     SES, BS CT(ASCP)   Assessment Assessed  1. Abnormal bladder cytology (R89.6) 2. History of carcinoma in situ of bladder (Z86.008)  He has a positive cytology with a negative cystoscopy.   Plan Abnormal bladder cytology  1. BUN & CREATININE; Status:Hold For - Specimen/Data Collection,Appointment;  Requested for:01Dec2016;  2. Follow-up Schedule Surgery Office  Follow-up  Status: Hold For - Appointment   Requested for: [id number] Abnormal bladder cytology, Bladder cancer  3. AU CT-HEMATURIA PROTOCOL; Status:Hold For - Appointment,PreCert,Date of  Service,Print; Requested for:01Dec2016;   I am going to get a CT scan and will evaluate any upper tract abnormalties found but if that is negative I am going to take him to the OR for cystoscopy with bilateral washings and possible ureteroscopy with stents, bladder biopsy and TUR prostate biopsy.  I reviewed the risks of bleeding, infection, ureteral injury, bladder or urethral injury, need for a stent, thrombotic events and anesthetic complications.   Discussion/Summary CC: Dr. Jani Gravel.

## 2015-07-25 NOTE — Progress Notes (Signed)
Final EKg done 12/89/16 in [facility].

## 2015-07-25 NOTE — Anesthesia Preprocedure Evaluation (Addendum)
Anesthesia Evaluation  Patient identified by MRN, date of birth, ID band Patient awake    Reviewed: Allergy & Precautions, NPO status , Patient's Chart, lab work & pertinent test results  Airway Mallampati: II  TM Distance: >3 FB Neck ROM: Full    Dental  (+) Edentulous Upper, Edentulous Lower   Pulmonary former smoker,    Pulmonary exam normal breath sounds clear to auscultation       Cardiovascular Exercise Tolerance: Good hypertension, Pt. on medications and Pt. on home beta blockers Normal cardiovascular exam Rhythm:Regular Rate:Normal     Neuro/Psych  Headaches, PSYCHIATRIC DISORDERS Anxiety Depression  Neuromuscular disease    GI/Hepatic Neg liver ROS, GERD  Medicated,  Endo/Other  diabetes, Type 2, Oral Hypoglycemic Agents  Renal/GU Renal disease  negative genitourinary   Musculoskeletal  (+) Arthritis ,   Abdominal   Peds negative pediatric ROS (+)  Hematology  (+) anemia ,   Anesthesia Other Findings   Reproductive/Obstetrics negative OB ROS                            Anesthesia Physical Anesthesia Plan  ASA: III  Anesthesia Plan: General   Post-op Pain Management:    Induction: Intravenous  Airway Management Planned: LMA  Additional Equipment:   Intra-op Plan:   Post-operative Plan: Extubation in OR  Informed Consent: I have reviewed the patients History and Physical, chart, labs and discussed the procedure including the risks, benefits and alternatives for the proposed anesthesia with the patient or authorized representative who has indicated his/her understanding and acceptance.   Dental advisory given  Plan Discussed with: CRNA  Anesthesia Plan Comments:         Anesthesia Quick Evaluation

## 2015-07-26 ENCOUNTER — Observation Stay (HOSPITAL_COMMUNITY)
Admission: RE | Admit: 2015-07-26 | Discharge: 2015-07-27 | Disposition: A | Discharge status: Home / Self Care | Payer: Medicare Other | Source: Ambulatory Visit | Attending: Urology | Admitting: Urology

## 2015-07-26 ENCOUNTER — Ambulatory Visit (HOSPITAL_COMMUNITY): Payer: Medicare Other | Admitting: Anesthesiology

## 2015-07-26 ENCOUNTER — Encounter (HOSPITAL_COMMUNITY): Payer: Self-pay

## 2015-07-26 ENCOUNTER — Encounter (HOSPITAL_COMMUNITY)
Admission: RE | Disposition: A | Discharge status: Home / Self Care | Payer: Self-pay | Source: Ambulatory Visit | Attending: Urology

## 2015-07-26 DIAGNOSIS — R896 Abnormal cytological findings in specimens from other organs, systems and tissues: Secondary | ICD-10-CM | POA: Diagnosis not present

## 2015-07-26 DIAGNOSIS — E1140 Type 2 diabetes mellitus with diabetic neuropathy, unspecified: Secondary | ICD-10-CM | POA: Diagnosis not present

## 2015-07-26 DIAGNOSIS — G709 Myoneural disorder, unspecified: Secondary | ICD-10-CM | POA: Diagnosis not present

## 2015-07-26 DIAGNOSIS — Z96659 Presence of unspecified artificial knee joint: Secondary | ICD-10-CM | POA: Diagnosis not present

## 2015-07-26 DIAGNOSIS — N138 Other obstructive and reflux uropathy: Secondary | ICD-10-CM | POA: Diagnosis not present

## 2015-07-26 DIAGNOSIS — Z7984 Long term (current) use of oral hypoglycemic drugs: Secondary | ICD-10-CM | POA: Insufficient documentation

## 2015-07-26 DIAGNOSIS — N3941 Urge incontinence: Secondary | ICD-10-CM | POA: Diagnosis not present

## 2015-07-26 DIAGNOSIS — N362 Urethral caruncle: Secondary | ICD-10-CM | POA: Diagnosis not present

## 2015-07-26 DIAGNOSIS — Z8042 Family history of malignant neoplasm of prostate: Secondary | ICD-10-CM | POA: Diagnosis not present

## 2015-07-26 DIAGNOSIS — M1990 Unspecified osteoarthritis, unspecified site: Secondary | ICD-10-CM | POA: Insufficient documentation

## 2015-07-26 DIAGNOSIS — N499 Inflammatory disorder of unspecified male genital organ: Secondary | ICD-10-CM | POA: Diagnosis not present

## 2015-07-26 DIAGNOSIS — N411 Chronic prostatitis: Secondary | ICD-10-CM | POA: Diagnosis not present

## 2015-07-26 DIAGNOSIS — Z7982 Long term (current) use of aspirin: Secondary | ICD-10-CM | POA: Insufficient documentation

## 2015-07-26 DIAGNOSIS — N401 Enlarged prostate with lower urinary tract symptoms: Secondary | ICD-10-CM | POA: Diagnosis not present

## 2015-07-26 DIAGNOSIS — N281 Cyst of kidney, acquired: Secondary | ICD-10-CM | POA: Insufficient documentation

## 2015-07-26 DIAGNOSIS — Z8551 Personal history of malignant neoplasm of bladder: Secondary | ICD-10-CM | POA: Diagnosis not present

## 2015-07-26 DIAGNOSIS — Z87891 Personal history of nicotine dependence: Secondary | ICD-10-CM | POA: Insufficient documentation

## 2015-07-26 DIAGNOSIS — R351 Nocturia: Secondary | ICD-10-CM | POA: Diagnosis not present

## 2015-07-26 DIAGNOSIS — K219 Gastro-esophageal reflux disease without esophagitis: Secondary | ICD-10-CM | POA: Diagnosis not present

## 2015-07-26 DIAGNOSIS — Z85038 Personal history of other malignant neoplasm of large intestine: Secondary | ICD-10-CM | POA: Diagnosis not present

## 2015-07-26 DIAGNOSIS — H9193 Unspecified hearing loss, bilateral: Secondary | ICD-10-CM | POA: Diagnosis not present

## 2015-07-26 DIAGNOSIS — Z79899 Other long term (current) drug therapy: Secondary | ICD-10-CM | POA: Insufficient documentation

## 2015-07-26 DIAGNOSIS — R828 Abnormal findings on cytological and histological examination of urine: Secondary | ICD-10-CM | POA: Diagnosis not present

## 2015-07-26 DIAGNOSIS — R918 Other nonspecific abnormal finding of lung field: Secondary | ICD-10-CM | POA: Diagnosis not present

## 2015-07-26 DIAGNOSIS — I10 Essential (primary) hypertension: Secondary | ICD-10-CM | POA: Diagnosis not present

## 2015-07-26 DIAGNOSIS — Z86008 Personal history of in-situ neoplasm of other site: Secondary | ICD-10-CM | POA: Insufficient documentation

## 2015-07-26 DIAGNOSIS — N189 Chronic kidney disease, unspecified: Secondary | ICD-10-CM | POA: Diagnosis not present

## 2015-07-26 DIAGNOSIS — C679 Malignant neoplasm of bladder, unspecified: Secondary | ICD-10-CM | POA: Diagnosis present

## 2015-07-26 DIAGNOSIS — I129 Hypertensive chronic kidney disease with stage 1 through stage 4 chronic kidney disease, or unspecified chronic kidney disease: Secondary | ICD-10-CM | POA: Diagnosis not present

## 2015-07-26 HISTORY — PX: CYSTOSCOPY WITH URETEROSCOPY: SHX5123

## 2015-07-26 HISTORY — PX: TRANSURETHRAL RESECTION OF PROSTATE: SHX73

## 2015-07-26 HISTORY — PX: PROSTATE BIOPSY: SHX241

## 2015-07-26 LAB — GLUCOSE, CAPILLARY
GLUCOSE-CAPILLARY: 96 mg/dL (ref 65–99)
Glucose-Capillary: 93 mg/dL (ref 65–99)

## 2015-07-26 SURGERY — CYSTOSCOPY WITH URETEROSCOPY
Anesthesia: General

## 2015-07-26 MED ORDER — OXYBUTYNIN CHLORIDE ER 10 MG PO TB24
10.0000 mg | ORAL_TABLET | Freq: Every day | ORAL | Status: DC
  Administered 2015-07-27: 10 mg via ORAL
  Filled 2015-07-26: qty 1

## 2015-07-26 MED ORDER — ONDANSETRON HCL 4 MG/2ML IJ SOLN
INTRAMUSCULAR | Status: DC | PRN
  Administered 2015-07-26: 4 mg via INTRAVENOUS

## 2015-07-26 MED ORDER — TERAZOSIN HCL 2 MG PO CAPS
2.0000 mg | ORAL_CAPSULE | Freq: Every day | ORAL | Status: DC
  Administered 2015-07-26: 2 mg via ORAL
  Filled 2015-07-26 (×2): qty 1

## 2015-07-26 MED ORDER — ACETAMINOPHEN 325 MG PO TABS: 650.0000 mg | ORAL_TABLET | Freq: Four times a day (QID) | ORAL | Status: DC | PRN

## 2015-07-26 MED ORDER — CIPROFLOXACIN IN D5W 400 MG/200ML IV SOLN
INTRAVENOUS | Status: AC
  Filled 2015-07-26: qty 200

## 2015-07-26 MED ORDER — LIDOCAINE HCL (CARDIAC) 20 MG/ML IV SOLN
INTRAVENOUS | Status: DC | PRN
  Administered 2015-07-26: 100 mg via INTRAVENOUS

## 2015-07-26 MED ORDER — ALIGN 4 MG PO CAPS
1.0000 | ORAL_CAPSULE | Freq: Every day | ORAL | Status: DC
  Administered 2015-07-26 – 2015-07-27 (×2): 4 mg via ORAL
  Filled 2015-07-26 (×2): qty 1

## 2015-07-26 MED ORDER — FENTANYL CITRATE (PF) 100 MCG/2ML IJ SOLN
25.0000 ug | INTRAMUSCULAR | Status: DC | PRN
  Administered 2015-07-26 (×2): 50 ug via INTRAVENOUS

## 2015-07-26 MED ORDER — HYDROMORPHONE HCL 1 MG/ML IJ SOLN
0.5000 mg | INTRAMUSCULAR | Status: DC | PRN
  Administered 2015-07-26 – 2015-07-27 (×4): 1 mg via INTRAVENOUS
  Filled 2015-07-26 (×4): qty 1

## 2015-07-26 MED ORDER — MAGNESIUM HYDROXIDE 400 MG/5ML PO SUSP: 30.0000 mL | Freq: Every day | ORAL | Status: DC | PRN

## 2015-07-26 MED ORDER — GUAIFENESIN ER 600 MG PO TB12: 600.0000 mg | ORAL_TABLET | Freq: Two times a day (BID) | ORAL | Status: DC | PRN

## 2015-07-26 MED ORDER — LIDOCAINE HCL (CARDIAC) 20 MG/ML IV SOLN
INTRAVENOUS | Status: AC
  Filled 2015-07-26: qty 5

## 2015-07-26 MED ORDER — VENLAFAXINE HCL ER 75 MG PO CP24
75.0000 mg | ORAL_CAPSULE | Freq: Two times a day (BID) | ORAL | Status: DC
  Administered 2015-07-26 – 2015-07-27 (×3): 75 mg via ORAL
  Filled 2015-07-26 (×3): qty 1

## 2015-07-26 MED ORDER — LATANOPROST 0.005 % OP SOLN
1.0000 [drp] | Freq: Every day | OPHTHALMIC | Status: DC
  Administered 2015-07-26: 1 [drp] via OPHTHALMIC
  Filled 2015-07-26: qty 2.5

## 2015-07-26 MED ORDER — ATENOLOL 25 MG PO TABS
25.0000 mg | ORAL_TABLET | Freq: Every morning | ORAL | Status: DC
  Administered 2015-07-27: 25 mg via ORAL
  Filled 2015-07-26: qty 1

## 2015-07-26 MED ORDER — METFORMIN HCL 500 MG PO TABS
500.0000 mg | ORAL_TABLET | Freq: Two times a day (BID) | ORAL | Status: DC
  Administered 2015-07-26 – 2015-07-27 (×2): 500 mg via ORAL
  Filled 2015-07-26 (×2): qty 1

## 2015-07-26 MED ORDER — FENTANYL CITRATE (PF) 100 MCG/2ML IJ SOLN
INTRAMUSCULAR | Status: DC | PRN
  Administered 2015-07-26 (×2): 50 ug via INTRAVENOUS

## 2015-07-26 MED ORDER — PROPOFOL 10 MG/ML IV BOLUS
INTRAVENOUS | Status: DC | PRN
  Administered 2015-07-26: 200 mg via INTRAVENOUS

## 2015-07-26 MED ORDER — SODIUM CHLORIDE 0.9 % IR SOLN
Status: DC | PRN
  Administered 2015-07-26: 3000 mL via INTRAVESICAL

## 2015-07-26 MED ORDER — CLONAZEPAM 0.5 MG PO TABS: 0.5000 mg | ORAL_TABLET | Freq: Two times a day (BID) | ORAL | Status: DC | PRN

## 2015-07-26 MED ORDER — LORATADINE 10 MG PO TABS
10.0000 mg | ORAL_TABLET | Freq: Every day | ORAL | Status: DC
  Administered 2015-07-27: 10 mg via ORAL
  Filled 2015-07-26: qty 1

## 2015-07-26 MED ORDER — AMLODIPINE BESYLATE 5 MG PO TABS
5.0000 mg | ORAL_TABLET | Freq: Every day | ORAL | Status: DC
  Administered 2015-07-26 – 2015-07-27 (×2): 5 mg via ORAL
  Filled 2015-07-26 (×2): qty 1

## 2015-07-26 MED ORDER — FENTANYL CITRATE (PF) 100 MCG/2ML IJ SOLN
INTRAMUSCULAR | Status: AC
  Filled 2015-07-26: qty 2

## 2015-07-26 MED ORDER — CIPROFLOXACIN IN D5W 400 MG/200ML IV SOLN
400.0000 mg | INTRAVENOUS | Status: AC
  Administered 2015-07-26: 400 mg via INTRAVENOUS

## 2015-07-26 MED ORDER — FLEET ENEMA 7-19 GM/118ML RE ENEM: 1.0000 | ENEMA | Freq: Once | RECTAL | Status: DC | PRN

## 2015-07-26 MED ORDER — AMMONIUM LACTATE 12 % EX LOTN
1.0000 "application " | TOPICAL_LOTION | Freq: Every day | CUTANEOUS | Status: DC | PRN
  Filled 2015-07-26: qty 400

## 2015-07-26 MED ORDER — POTASSIUM CHLORIDE IN NACL 20-0.45 MEQ/L-% IV SOLN
INTRAVENOUS | Status: DC
  Administered 2015-07-26 (×2): via INTRAVENOUS
  Filled 2015-07-26 (×7): qty 1000

## 2015-07-26 MED ORDER — LACTATED RINGERS IV SOLN
INTRAVENOUS | Status: DC | PRN
  Administered 2015-07-26: 07:00:00 via INTRAVENOUS

## 2015-07-26 MED ORDER — ONDANSETRON HCL 4 MG/2ML IJ SOLN
4.0000 mg | INTRAMUSCULAR | Status: DC | PRN
  Administered 2015-07-26: 4 mg via INTRAVENOUS
  Filled 2015-07-26: qty 2

## 2015-07-26 MED ORDER — ASPIRIN EC 81 MG PO TBEC: 81.0000 mg | DELAYED_RELEASE_TABLET | Freq: Every day | ORAL | Status: DC | PRN

## 2015-07-26 MED ORDER — HYDROCODONE-ACETAMINOPHEN 5-325 MG PO TABS: 1.0000 | ORAL_TABLET | ORAL | Status: DC | PRN

## 2015-07-26 MED ORDER — ONDANSETRON HCL 4 MG/2ML IJ SOLN
INTRAMUSCULAR | Status: AC
  Filled 2015-07-26: qty 2

## 2015-07-26 MED ORDER — PROMETHAZINE HCL 25 MG/ML IJ SOLN: 6.2500 mg | INTRAMUSCULAR | Status: DC | PRN

## 2015-07-26 MED ORDER — ACETAMINOPHEN 325 MG PO TABS: 650.0000 mg | ORAL_TABLET | ORAL | Status: DC | PRN

## 2015-07-26 MED ORDER — PROPOFOL 10 MG/ML IV BOLUS
INTRAVENOUS | Status: AC
  Filled 2015-07-26: qty 20

## 2015-07-26 MED ORDER — BISACODYL 10 MG RE SUPP
10.0000 mg | Freq: Every day | RECTAL | Status: DC | PRN
  Administered 2015-07-27: 10 mg via RECTAL
  Filled 2015-07-26: qty 1

## 2015-07-26 MED ORDER — PANTOPRAZOLE SODIUM 40 MG PO TBEC
40.0000 mg | DELAYED_RELEASE_TABLET | Freq: Every day | ORAL | Status: DC
  Administered 2015-07-27: 40 mg via ORAL
  Filled 2015-07-26: qty 1

## 2015-07-26 MED ORDER — FINASTERIDE 5 MG PO TABS
5.0000 mg | ORAL_TABLET | Freq: Every day | ORAL | Status: DC
  Administered 2015-07-26: 5 mg via ORAL
  Filled 2015-07-26: qty 1

## 2015-07-26 MED ORDER — ZOLPIDEM TARTRATE 5 MG PO TABS: 5.0000 mg | ORAL_TABLET | Freq: Every evening | ORAL | Status: DC | PRN

## 2015-07-26 SURGICAL SUPPLY — 33 items
BAG URINE DRAINAGE (UROLOGICAL SUPPLIES) IMPLANT
BAG URO CATCHER STRL LF (DRAPE) ×4 IMPLANT
BASKET LASER NITINOL 1.9FR (BASKET) ×2 IMPLANT
BASKET ZERO TIP NITINOL 2.4FR (BASKET) IMPLANT
BSKT STON RTRVL 120 1.9FR (BASKET)
BSKT STON RTRVL ZERO TP 2.4FR (BASKET)
CATH FOLEY 3WAY 30CC 22FR (CATHETERS) IMPLANT
CATH URET 5FR 28IN OPEN ENDED (CATHETERS) ×2 IMPLANT
CLOTH BEACON ORANGE TIMEOUT ST (SAFETY) ×4 IMPLANT
ELECT REM PT RETURN 9FT ADLT (ELECTROSURGICAL) ×4
ELECTRODE REM PT RTRN 9FT ADLT (ELECTROSURGICAL) ×2 IMPLANT
FIBER LASER FLEXIVA 1000 (UROLOGICAL SUPPLIES) IMPLANT
FIBER LASER FLEXIVA 200 (UROLOGICAL SUPPLIES) IMPLANT
FIBER LASER FLEXIVA 365 (UROLOGICAL SUPPLIES) IMPLANT
FIBER LASER FLEXIVA 550 (UROLOGICAL SUPPLIES) IMPLANT
FIBER LASER TRAC TIP (UROLOGICAL SUPPLIES) IMPLANT
GLOVE SURG SS PI 8.0 STRL IVOR (GLOVE) IMPLANT
GOWN STRL REUS W/TWL XL LVL3 (GOWN DISPOSABLE) ×4 IMPLANT
GUIDEWIRE ANG ZIPWIRE 038X150 (WIRE) IMPLANT
GUIDEWIRE STR DUAL SENSOR (WIRE) ×4 IMPLANT
HOLDER FOLEY CATH W/STRAP (MISCELLANEOUS) IMPLANT
KIT ASPIRATION TUBING (SET/KITS/TRAYS/PACK) ×2 IMPLANT
LOOP CUT BIPOLAR 24F LRG (ELECTROSURGICAL) IMPLANT
MANIFOLD NEPTUNE II (INSTRUMENTS) ×4 IMPLANT
PACK CYSTO (CUSTOM PROCEDURE TRAY) ×4 IMPLANT
SHEATH ACCESS URETERAL 24CM (SHEATH) ×2 IMPLANT
SHEATH ACCESS URETERAL 38CM (SHEATH) ×2 IMPLANT
SYR 30ML LL (SYRINGE) IMPLANT
SYR CONTROL 10ML LL (SYRINGE) ×4 IMPLANT
SYRINGE IRR TOOMEY STRL 70CC (SYRINGE) IMPLANT
TUBING CONNECTING 10 (TUBING) ×3 IMPLANT
TUBING CONNECTING 10' (TUBING) ×1
UNDERPAD 30X30 INCONTINENT (UNDERPADS AND DIAPERS) ×4 IMPLANT

## 2015-07-26 NOTE — Progress Notes (Signed)
This RN is taking over the patient's care and agrees with the previous RN's assessment. Will continue to monitor. 

## 2015-07-26 NOTE — Progress Notes (Signed)
Dr. Delma Post in- made aware of patient's blood pressures and heart rates

## 2015-07-26 NOTE — Progress Notes (Signed)
Patient ID: [name], male   DOB: Jul 28, 1933, 81 y.o.   MRN: [id number] He voided incontinently before he came up and is having some abdominal pain that is difficult to localize.  I will check a PVR and if >235ml place a foley.  His pain could be clot colic or spasm from the ureteroscopy since he has no stents.

## 2015-07-26 NOTE — Brief Op Note (Signed)
07/26/2015  8:22 AM  PATIENT:  [name]  81 y.o. male  PRE-OPERATIVE DIAGNOSIS:  HISTORY OF BLADDER CANCER, POSITIVE CYTOLOGY  POST-OPERATIVE DIAGNOSIS:  h/o bladder cancer  PROCEDURE:  Procedure(s): CYSTO, BILATERAL RENAL WASHING WITH    URETEROSCOPY, TUR PROSTATE BIOPSY (Bilateral)   SURGEON:  Surgeon(s) and Role:    * [name], MD - Primary  PHYSICIAN ASSISTANT:   ASSISTANTS: none   ANESTHESIA:   general  EBL:     BLOOD ADMINISTERED:none  DRAINS: none   LOCAL MEDICATIONS USED:  NONE  SPECIMEN:  Source of Specimen:  Washings from right and left distal ureters, washings from right and left renal pelves, TUR prostate biopsy.   DISPOSITION OF SPECIMEN:  PATHOLOGY  COUNTS:  YES  TOURNIQUET:  * No tourniquets in log *  DICTATION: .Other Dictation: Dictation Number [id number]  PLAN OF CARE: Admit for overnight observation  PATIENT DISPOSITION:  PACU - hemodynamically stable.   Delay start of Pharmacological VTE agent (>24hrs) due to surgical blood loss or risk of bleeding: yes

## 2015-07-26 NOTE — Transfer of Care (Signed)
Immediate Anesthesia Transfer of Care Note  Patient: [name]  Procedure(s) Performed: Procedure(s): CYSTO, BILATERAL RENAL WASHING WITH    URETEROSCOPY, TUR PROSTATE BIOPSY (Bilateral) BLADDER TRANSURETHRAL RESECTION  (N/A) PROSTATE BIOPSY (N/A)  Patient Location: PACU  Anesthesia Type:General  Level of Consciousness: sedated  Airway & Oxygen Therapy: Patient Spontanous Breathing and Patient connected to face mask oxygen  Post-op Assessment: Report given to RN and Post -op Vital signs reviewed and stable  Post vital signs: Reviewed and stable  Last Vitals:  Filed Vitals:   07/26/15 0530  BP: 145/92  Pulse: 61  Temp: 36.3 C  Resp: 18    Complications: No apparent anesthesia complications

## 2015-07-26 NOTE — Interval H&P Note (Signed)
History and Physical Interval Note:  07/26/2015 7:25 AM  [name]  has presented today for surgery, with the diagnosis of HISTORY OF BLADDER CANCER, POSITIVE CYTOLOGY  The various methods of treatment have been discussed with the patient and family. After consideration of risks, benefits and other options for treatment, the patient has consented to  Procedure(s): CYSTO, BILATERAL RENAL WASHING WITH POSSIBLE  URETEROSCOPY (Bilateral) BLADDER TRANSURETHRAL RESECTION  (N/A) PROSTATE BIOPSY (N/A) as a surgical intervention .  The patient's history has been reviewed, patient examined, no change in status, stable for surgery.  I have reviewed the patient's chart and labs.  Questions were answered to the patient's satisfaction.     Scheryl Sanborn J

## 2015-07-26 NOTE — Anesthesia Postprocedure Evaluation (Signed)
Anesthesia Post Note  Patient: [name]  Procedure(s) Performed: Procedure(s) (LRB): CYSTO, BILATERAL RENAL WASHING WITH    URETEROSCOPY, TUR PROSTATE BIOPSY (Bilateral) BLADDER TRANSURETHRAL RESECTION  (N/A) PROSTATE BIOPSY (N/A)  Patient location during evaluation: PACU Anesthesia Type: General Level of consciousness: awake and alert Pain management: pain level controlled Vital Signs Assessment: post-procedure vital signs reviewed and stable Respiratory status: spontaneous breathing, nonlabored ventilation, respiratory function stable and patient connected to nasal cannula oxygen Cardiovascular status: blood pressure returned to baseline and stable Postop Assessment: no signs of nausea or vomiting Anesthetic complications: no    Last Vitals:  Filed Vitals:   07/26/15 1035 07/26/15 1608  BP: 194/88 201/90  Pulse: 55 60  Temp: 36.4 C 36.4 C  Resp: 20 16    Last Pain:  Filed Vitals:   07/26/15 1610  PainSc: Asleep                 Hutson Luft J

## 2015-07-27 DIAGNOSIS — R896 Abnormal cytological findings in specimens from other organs, systems and tissues: Secondary | ICD-10-CM | POA: Diagnosis not present

## 2015-07-27 DIAGNOSIS — N3941 Urge incontinence: Secondary | ICD-10-CM | POA: Diagnosis not present

## 2015-07-27 DIAGNOSIS — N401 Enlarged prostate with lower urinary tract symptoms: Secondary | ICD-10-CM | POA: Diagnosis not present

## 2015-07-27 DIAGNOSIS — N138 Other obstructive and reflux uropathy: Secondary | ICD-10-CM | POA: Diagnosis not present

## 2015-07-27 DIAGNOSIS — R351 Nocturia: Secondary | ICD-10-CM | POA: Diagnosis not present

## 2015-07-27 DIAGNOSIS — N362 Urethral caruncle: Secondary | ICD-10-CM | POA: Diagnosis not present

## 2015-07-27 LAB — HEMOGLOBIN A1C
Hgb A1c MFr Bld: 6.4 % — ABNORMAL HIGH (ref 4.8–5.6)
Mean Plasma Glucose: 137 mg/dL

## 2015-07-27 MED ORDER — HYDROCODONE-ACETAMINOPHEN 5-325 MG PO TABS: 1.0000 | ORAL_TABLET | ORAL | Status: DC | PRN

## 2015-07-27 NOTE — Discharge Instructions (Signed)

## 2015-07-27 NOTE — Op Note (Signed)
NAMEJASTON, [name]                ACCOUNT NO.:  [id number]  MEDICAL RECORD NO.:  [id number]  LOCATION:  [place]:  [facility]  PHYSICIAN:  Marshall Cork. Jeffie Pollock, M.D.    DATE OF BIRTH:  [birth year]  DATE OF PROCEDURE:  07/26/2015 DATE OF DISCHARGE:                              OPERATIVE REPORT   PROCEDURES:  Cystoscopy with bilateral ureteroscopy with renal and ureteral washings and transurethral resection of prostate biopsy.  PREOPERATIVE DIAGNOSIS:  History of carcinoma in situ of the bladder with a positive cytology and negative CT scan.  POSTOPERATIVE DIAGNOSIS:  History of carcinoma in situ of the bladder with a positive cytology and negative CT scan.  SURGEON:  Marshall Cork. Jeffie Pollock, M.D.  ANESTHESIA:  General.  SPECIMEN:  Washings from the right and left distal ureters and right and left renal pelves along with TUR, prostate biopsy chips.  BLOOD LOSS:  Minimal.  DRAINS:  None.  COMPLICATIONS:  None.  INDICATIONS:  [name] is an 81 year old African American male with history of carcinoma in situ of the bladder, prior BCG therapy.  He was found to have suspicious cytology on recent admission, but a negative cystoscopy.  A CT scan was done, which revealed bilateral renal cyst, but no intrarenal ureteral or ureteral filling defects.  It was felt that further evaluation was indicated.  FINDINGS OF PROCEDURE:  He was taken to the operating room where he was given Cipro, general anesthetic was induced.  He was placed in lithotomy position.  His perineum and genitalia were prepped with Betadine solution.  He was draped in usual sterile fashion.  Cystoscopy was performed using the 23-French scope and 30-degree lens. Examination revealed a normal urethra on the right distal to the sphincter and the bulbar urethra was a single benign-appearing polyp. The bladder itself was approximately 3 cm in length with trilobar hyperplasia with mild obstruction.   Examination of the bladder revealed mild trabeculation.  No mucosal lesions were identified other than a scar in the right anterior bladder neck area from his prior resections with no surrounding erythema.  The ureteral orifices were unremarkable.  The 70-degree lens was also used to completely inspect the bladder and no suspicious lesions were noted in the bladder that would merit biopsy.  After completion of the thorough cystoscopic inspection, an 8-French flexible digital ureteroscope was passed per urethra.  The right ureteral orifice was identified and despite the use of a Sensor wire as a filiform, I was unable to negotiate it into the distal ureter.  The wire was advanced to the midureter and the scope was removed.  A 25-mm 12/14 digital access sheath was then inserted over the wire into the distal ureter and the inner core and wire were removed.  I was then able to pass the ureteroscope easily into the distal ureter.  The mucosa there had some questionable changes, but I could not say with any definitive certainty that there were findings suggestive of carcinoma in situ.  However, I did take washings from the distal ureter.  I then attempted to advance the scope to the renal pelvis, but was unsuccessful.  No other lesions were noted on  inspection up to the lower proximal ureter.  At this point, the ureteroscope was removed and the access sheath was removed.  I then repeated the process on the left side using the short access sheath and the ureteroscope to obtain washings from the distal ureter on the left.  I was able to advance the scope into the renal collecting system and inspected all calyceal systems, no lesions or worrisome mucosal abnormalities were noted.  Washings were obtained from the renal pelvis in addition to the distal ureter.  At this point, the ureteroscope was removed and the cystoscope was reinserted.  A 5-French open-ended catheter was inserted to the  right renal pelvis after flushed the catheter with saline and then washings using normal saline were obtained from the right renal pelvis.  At this point, the ureteral catheter was removed along with the cystoscope and a 28-French continuous flow resectoscope sheath was inserted.  This was fitted with an [place] handle, a bipolar loop and the 30-degree lens.  I then took approximately four chips of prostate tissue from the middle lobe and the lateral lobes.  Once this was done, hemostasis was achieved.  I also fulgurated the small polyp, which appeared benign and the bulbar urethra, and partially drained the bladder.  I did not feel a catheter was indicated.  At this point, the patient was taken down from lithotomy position.  His anesthetic was reversed.  He was moved to the recovery room in stable condition.  These specimens were all sent to Pathology.  There were no complications.     Marshall Cork. Jeffie Pollock, M.D.     JJW/MEDQ  D:  07/26/2015  T:  07/27/2015  Job:  [id number]

## 2015-07-27 NOTE — Discharge Summary (Signed)
Physician Discharge Summary  Patient ID: [name] MRN: [id number] DOB/AGE: 1933/03/13 81 y.o.  Admit date: 07/26/2015 Discharge date: 07/27/2015  Admission Diagnoses:  Bladder cancer [name])  Discharge Diagnoses:  Principal Problem:   Bladder cancer [name])   Past Medical History  Diagnosis Date  . History of colon cancer   . Hypertension   . Osteoporosis   . Peripheral neuropathy ([name])   . Allergic rhinitis   . BPH (benign prostatic hypertrophy)     FREQUENT URINATION   . Posttraumatic stress disorder   . DJD (degenerative joint disease)   . Dyspepsia   . DM2 (diabetes mellitus, type 2) ([name])   . Hearing loss     bilateral  . Cataract     bilateral extrraction and lens implants '88-,'89  . Tubular adenoma 02/17/2008  . Diverticulosis   . Anemia   . Cancer ([place])   . GERD (gastroesophageal reflux disease)   . Depression   . Glaucoma   . Glaucoma   . Nonsustained ventricular tachycardia ([name])     PAST HISTORY  . Bladder tumor     WAS HAVING BLOOD IN URINE - CLEARED BUT OCCAS CLOT NOW  . Restless leg syndrome   . Multiple pulmonary nodules     SEEN ON CT CHEST DONE 05/25/14 AT [name]  . Anxiety   . Headache     Surgeries: Procedure(s): CYSTO, BILATERAL RENAL WASHING WITH    URETEROSCOPY, TUR PROSTATE BIOPSY BLADDER TRANSURETHRAL RESECTION  PROSTATE BIOPSY on 07/26/2015   Consultants (if any):    Discharged Condition: Improved  Hospital Course: ROLLA Mejia is an 81 y.o. male who was admitted 07/26/2015 with a diagnosis of Bladder cancer ([name]) and went to the operating room on 07/26/2015 and underwent the above named procedures.  He is doing well this morning and is voiding clear urine.  His PVR yesterday was only 81ml.  He has some lower abdominal pain and constipation but no flank pain or fever.  He had nausea with vomiting in the night but that has abated.   He is ready for discharge but will be given a dulcolax suppository this  morning for his constipation complaint.   He was given perioperative antibiotics:  Anti-infectives    Start     Dose/Rate Route Frequency Ordered Stop   07/26/15 0552  ciprofloxacin (CIPRO) IVPB 400 mg     400 mg 200 mL/hr over 60 Minutes Intravenous 60 min pre-op 07/26/15 0552 07/26/15 0740    .  He was given sequential compression devices and early ambulation for DVT prophylaxis.  He benefited maximally from the hospital stay and there were no complications.    Recent vital signs:  Filed Vitals:   07/26/15 2155 07/27/15 0554  BP: 167/85 168/82  Pulse: 65 58  Temp: 98.2 F (36.8 C) 97.6 F (36.4 C)  Resp: 18 18    Recent laboratory studies:  Lab Results  Component Value Date   HGB 13.7 07/22/2015   HGB 14.7 05/28/2014   HGB 13.2* 09/04/2012   Lab Results  Component Value Date   WBC 4.3 07/22/2015   PLT 182 07/22/2015   No results found for: INR Lab Results  Component Value Date   NA 142 07/22/2015   K 3.8 07/22/2015   CL 107 07/22/2015   CO2 28 07/22/2015   BUN 21* 07/22/2015   CREATININE 1.07 07/22/2015   GLUCOSE 108* 07/22/2015    Discharge Medications:     Medication List  TAKE these medications        acetaminophen 325 MG tablet  Commonly known as:  TYLENOL  Take 650 mg by mouth every 6 (six) hours as needed for mild pain.     ALIGN 4 MG Caps  Take 1 capsule by mouth daily.     PROBIOTIC DAILY PO  Take 4 tablets by mouth daily.     amLODipine 5 MG tablet  Commonly known as:  NORVASC  Take 5 mg by mouth daily.     ammonium lactate 12 % lotion  Commonly known as:  LAC-HYDRIN  Apply 1 application topically daily as needed for dry skin.     aspirin 81 MG tablet  Take 81 mg by mouth daily as needed for pain.     atenolol 25 MG tablet  Commonly known as:  TENORMIN  Take 25 mg by mouth every morning.     atorvastatin 40 MG tablet  Commonly known as:  LIPITOR  Take 40 mg by mouth daily.     bisacodyl 10 MG suppository  Commonly  known as:  DULCOLAX  Place 10 mg rectally daily as needed for moderate constipation.     CALCIUM 500 + D PO  Take 1 tablet by mouth daily.     cetirizine 10 MG tablet  Commonly known as:  ZYRTEC  Take 10 mg by mouth daily as needed for allergies.     clonazePAM 0.5 MG tablet  Commonly known as:  KLONOPIN  Take 0.5 mg by mouth 2 (two) times daily as needed for anxiety.     finasteride 5 MG tablet  Commonly known as:  PROSCAR  Take 5 mg by mouth at bedtime.     gabapentin 100 MG capsule  Commonly known as:  NEURONTIN  Take 100 mg by mouth 3 (three) times daily.     guaiFENesin 600 MG 12 hr tablet  Commonly known as:  MUCINEX  Take 600 mg by mouth 2 (two) times daily as needed for cough or to loosen phlegm.     HYDROcodone-acetaminophen 5-325 MG tablet  Commonly known as:  NORCO/VICODIN  Take 1-2 tablets by mouth every 4 (four) hours as needed for moderate pain.     lactose free nutrition Liqd  Take 237 mLs by mouth daily.     latanoprost 0.005 % ophthalmic solution  Commonly known as:  XALATAN  Place 1 drop into the right eye at bedtime.     metFORMIN 500 MG tablet  Commonly known as:  GLUCOPHAGE  Take 500 mg by mouth 2 (two) times daily with a meal.     omeprazole 20 MG capsule  Commonly known as:  PRILOSEC  Take 20 mg by mouth 2 (two) times daily.     OVER THE COUNTER MEDICATION  Take 1 packet by mouth daily. Ion Z supplement     OVER THE COUNTER MEDICATION  Take 2 oz by mouth daily. Rain Product "Soul" Pure Wellness antioxadant     OVER THE COUNTER MEDICATION  Take 1 tablet by mouth 3 (three) times a week. Rain Product "Core" Nutrients     OVER THE COUNTER MEDICATION  Take 1 capsule by mouth daily as needed. Nugenix- testosterone booster     OVER THE COUNTER MEDICATION  Take 1 tablet by mouth daily as needed (Blackcore Edget Max for male enhancement).     oxybutynin 10 MG 24 hr tablet  Commonly known as:  DITROPAN-XL  Take 10 mg by mouth daily.  sildenafil 100 MG tablet  Commonly known as:  VIAGRA  Take 100 mg by mouth daily as needed for erectile dysfunction.     terazosin 2 MG capsule  Commonly known as:  HYTRIN  Take 2 mg by mouth at bedtime.     venlafaxine XR 75 MG 24 hr capsule  Commonly known as:  EFFEXOR-XR  Take 75 mg by mouth 2 (two) times daily.     VITAMIN B-12 PO  Take 50 mcg by mouth daily.     Vitamin D-3 1000 UNITS Caps  Take 1 capsule by mouth daily.        Diagnostic Studies: No results found.  Disposition: 01-Home or Self Care        Follow-up Information    Follow up with [name], MD On 08/04/2015.   Specialty:  Urology   Why:  as scheduled   Contact information:   [place] [address] [phone]        Signed: [name] 07/27/2015, 8:51 AM

## 2015-08-04 DIAGNOSIS — Z86008 Personal history of in-situ neoplasm of other site: Secondary | ICD-10-CM | POA: Diagnosis not present

## 2015-08-04 DIAGNOSIS — R896 Abnormal cytological findings in specimens from other organs, systems and tissues: Secondary | ICD-10-CM | POA: Diagnosis not present

## 2015-09-01 DIAGNOSIS — F4310 Post-traumatic stress disorder, unspecified: Secondary | ICD-10-CM | POA: Diagnosis not present

## 2015-09-01 DIAGNOSIS — E119 Type 2 diabetes mellitus without complications: Secondary | ICD-10-CM | POA: Diagnosis not present

## 2015-09-01 DIAGNOSIS — G629 Polyneuropathy, unspecified: Secondary | ICD-10-CM | POA: Diagnosis not present

## 2015-09-01 DIAGNOSIS — N289 Disorder of kidney and ureter, unspecified: Secondary | ICD-10-CM | POA: Diagnosis not present

## 2015-09-05 DIAGNOSIS — M25561 Pain in right knee: Secondary | ICD-10-CM | POA: Diagnosis not present

## 2015-09-05 DIAGNOSIS — M461 Sacroiliitis, not elsewhere classified: Secondary | ICD-10-CM | POA: Diagnosis not present

## 2015-09-05 DIAGNOSIS — M25562 Pain in left knee: Secondary | ICD-10-CM | POA: Diagnosis not present

## 2015-09-05 DIAGNOSIS — M47817 Spondylosis without myelopathy or radiculopathy, lumbosacral region: Secondary | ICD-10-CM | POA: Diagnosis not present

## 2015-09-05 DIAGNOSIS — M542 Cervicalgia: Secondary | ICD-10-CM | POA: Diagnosis not present

## 2015-09-05 DIAGNOSIS — M4606 Spinal enthesopathy, lumbar region: Secondary | ICD-10-CM | POA: Diagnosis not present

## 2015-09-05 DIAGNOSIS — M5416 Radiculopathy, lumbar region: Secondary | ICD-10-CM | POA: Diagnosis not present

## 2015-09-05 DIAGNOSIS — M545 Low back pain: Secondary | ICD-10-CM | POA: Diagnosis not present

## 2015-09-05 DIAGNOSIS — M5386 Other specified dorsopathies, lumbar region: Secondary | ICD-10-CM | POA: Diagnosis not present

## 2015-09-05 DIAGNOSIS — M791 Myalgia: Secondary | ICD-10-CM | POA: Diagnosis not present

## 2015-09-05 DIAGNOSIS — M1712 Unilateral primary osteoarthritis, left knee: Secondary | ICD-10-CM | POA: Diagnosis not present

## 2015-09-13 DIAGNOSIS — M9905 Segmental and somatic dysfunction of pelvic region: Secondary | ICD-10-CM | POA: Diagnosis not present

## 2015-09-13 DIAGNOSIS — M25562 Pain in left knee: Secondary | ICD-10-CM | POA: Diagnosis not present

## 2015-09-13 DIAGNOSIS — M47817 Spondylosis without myelopathy or radiculopathy, lumbosacral region: Secondary | ICD-10-CM | POA: Diagnosis not present

## 2015-09-13 DIAGNOSIS — M461 Sacroiliitis, not elsewhere classified: Secondary | ICD-10-CM | POA: Diagnosis not present

## 2015-09-13 DIAGNOSIS — M545 Low back pain: Secondary | ICD-10-CM | POA: Diagnosis not present

## 2015-09-13 DIAGNOSIS — M4606 Spinal enthesopathy, lumbar region: Secondary | ICD-10-CM | POA: Diagnosis not present

## 2015-09-13 DIAGNOSIS — M5386 Other specified dorsopathies, lumbar region: Secondary | ICD-10-CM | POA: Diagnosis not present

## 2015-09-13 DIAGNOSIS — M9903 Segmental and somatic dysfunction of lumbar region: Secondary | ICD-10-CM | POA: Diagnosis not present

## 2015-09-13 DIAGNOSIS — M5416 Radiculopathy, lumbar region: Secondary | ICD-10-CM | POA: Diagnosis not present

## 2015-09-13 DIAGNOSIS — M1712 Unilateral primary osteoarthritis, left knee: Secondary | ICD-10-CM | POA: Diagnosis not present

## 2015-09-14 DIAGNOSIS — M1712 Unilateral primary osteoarthritis, left knee: Secondary | ICD-10-CM | POA: Diagnosis not present

## 2015-09-14 DIAGNOSIS — M47817 Spondylosis without myelopathy or radiculopathy, lumbosacral region: Secondary | ICD-10-CM | POA: Diagnosis not present

## 2015-09-14 DIAGNOSIS — M9905 Segmental and somatic dysfunction of pelvic region: Secondary | ICD-10-CM | POA: Diagnosis not present

## 2015-09-14 DIAGNOSIS — M461 Sacroiliitis, not elsewhere classified: Secondary | ICD-10-CM | POA: Diagnosis not present

## 2015-09-14 DIAGNOSIS — M25562 Pain in left knee: Secondary | ICD-10-CM | POA: Diagnosis not present

## 2015-09-14 DIAGNOSIS — M4606 Spinal enthesopathy, lumbar region: Secondary | ICD-10-CM | POA: Diagnosis not present

## 2015-09-14 DIAGNOSIS — M5416 Radiculopathy, lumbar region: Secondary | ICD-10-CM | POA: Diagnosis not present

## 2015-09-14 DIAGNOSIS — M5386 Other specified dorsopathies, lumbar region: Secondary | ICD-10-CM | POA: Diagnosis not present

## 2015-09-14 DIAGNOSIS — M545 Low back pain: Secondary | ICD-10-CM | POA: Diagnosis not present

## 2015-09-14 DIAGNOSIS — M9903 Segmental and somatic dysfunction of lumbar region: Secondary | ICD-10-CM | POA: Diagnosis not present

## 2015-09-15 DIAGNOSIS — M5386 Other specified dorsopathies, lumbar region: Secondary | ICD-10-CM | POA: Diagnosis not present

## 2015-09-15 DIAGNOSIS — M9905 Segmental and somatic dysfunction of pelvic region: Secondary | ICD-10-CM | POA: Diagnosis not present

## 2015-09-15 DIAGNOSIS — M545 Low back pain: Secondary | ICD-10-CM | POA: Diagnosis not present

## 2015-09-15 DIAGNOSIS — M4606 Spinal enthesopathy, lumbar region: Secondary | ICD-10-CM | POA: Diagnosis not present

## 2015-09-15 DIAGNOSIS — M5416 Radiculopathy, lumbar region: Secondary | ICD-10-CM | POA: Diagnosis not present

## 2015-09-15 DIAGNOSIS — M25562 Pain in left knee: Secondary | ICD-10-CM | POA: Diagnosis not present

## 2015-09-15 DIAGNOSIS — M1712 Unilateral primary osteoarthritis, left knee: Secondary | ICD-10-CM | POA: Diagnosis not present

## 2015-09-15 DIAGNOSIS — M461 Sacroiliitis, not elsewhere classified: Secondary | ICD-10-CM | POA: Diagnosis not present

## 2015-09-15 DIAGNOSIS — M9903 Segmental and somatic dysfunction of lumbar region: Secondary | ICD-10-CM | POA: Diagnosis not present

## 2015-09-15 DIAGNOSIS — M47817 Spondylosis without myelopathy or radiculopathy, lumbosacral region: Secondary | ICD-10-CM | POA: Diagnosis not present

## 2015-09-20 DIAGNOSIS — M47817 Spondylosis without myelopathy or radiculopathy, lumbosacral region: Secondary | ICD-10-CM | POA: Diagnosis not present

## 2015-09-20 DIAGNOSIS — M9905 Segmental and somatic dysfunction of pelvic region: Secondary | ICD-10-CM | POA: Diagnosis not present

## 2015-09-20 DIAGNOSIS — M545 Low back pain: Secondary | ICD-10-CM | POA: Diagnosis not present

## 2015-09-20 DIAGNOSIS — M25562 Pain in left knee: Secondary | ICD-10-CM | POA: Diagnosis not present

## 2015-09-20 DIAGNOSIS — M461 Sacroiliitis, not elsewhere classified: Secondary | ICD-10-CM | POA: Diagnosis not present

## 2015-09-20 DIAGNOSIS — M1712 Unilateral primary osteoarthritis, left knee: Secondary | ICD-10-CM | POA: Diagnosis not present

## 2015-09-20 DIAGNOSIS — M5386 Other specified dorsopathies, lumbar region: Secondary | ICD-10-CM | POA: Diagnosis not present

## 2015-09-20 DIAGNOSIS — M5416 Radiculopathy, lumbar region: Secondary | ICD-10-CM | POA: Diagnosis not present

## 2015-09-20 DIAGNOSIS — M9903 Segmental and somatic dysfunction of lumbar region: Secondary | ICD-10-CM | POA: Diagnosis not present

## 2015-09-20 DIAGNOSIS — M4606 Spinal enthesopathy, lumbar region: Secondary | ICD-10-CM | POA: Diagnosis not present

## 2015-09-21 DIAGNOSIS — M461 Sacroiliitis, not elsewhere classified: Secondary | ICD-10-CM | POA: Diagnosis not present

## 2015-09-21 DIAGNOSIS — M1712 Unilateral primary osteoarthritis, left knee: Secondary | ICD-10-CM | POA: Diagnosis not present

## 2015-09-21 DIAGNOSIS — M9903 Segmental and somatic dysfunction of lumbar region: Secondary | ICD-10-CM | POA: Diagnosis not present

## 2015-09-21 DIAGNOSIS — M25562 Pain in left knee: Secondary | ICD-10-CM | POA: Diagnosis not present

## 2015-09-21 DIAGNOSIS — M47817 Spondylosis without myelopathy or radiculopathy, lumbosacral region: Secondary | ICD-10-CM | POA: Diagnosis not present

## 2015-09-21 DIAGNOSIS — M5416 Radiculopathy, lumbar region: Secondary | ICD-10-CM | POA: Diagnosis not present

## 2015-09-21 DIAGNOSIS — M9905 Segmental and somatic dysfunction of pelvic region: Secondary | ICD-10-CM | POA: Diagnosis not present

## 2015-09-21 DIAGNOSIS — M545 Low back pain: Secondary | ICD-10-CM | POA: Diagnosis not present

## 2015-09-21 DIAGNOSIS — M4606 Spinal enthesopathy, lumbar region: Secondary | ICD-10-CM | POA: Diagnosis not present

## 2015-09-21 DIAGNOSIS — M5386 Other specified dorsopathies, lumbar region: Secondary | ICD-10-CM | POA: Diagnosis not present

## 2015-09-22 DIAGNOSIS — M4606 Spinal enthesopathy, lumbar region: Secondary | ICD-10-CM | POA: Diagnosis not present

## 2015-09-22 DIAGNOSIS — M5416 Radiculopathy, lumbar region: Secondary | ICD-10-CM | POA: Diagnosis not present

## 2015-09-22 DIAGNOSIS — M5386 Other specified dorsopathies, lumbar region: Secondary | ICD-10-CM | POA: Diagnosis not present

## 2015-09-22 DIAGNOSIS — M25562 Pain in left knee: Secondary | ICD-10-CM | POA: Diagnosis not present

## 2015-09-22 DIAGNOSIS — M1712 Unilateral primary osteoarthritis, left knee: Secondary | ICD-10-CM | POA: Diagnosis not present

## 2015-09-22 DIAGNOSIS — M461 Sacroiliitis, not elsewhere classified: Secondary | ICD-10-CM | POA: Diagnosis not present

## 2015-09-22 DIAGNOSIS — M47817 Spondylosis without myelopathy or radiculopathy, lumbosacral region: Secondary | ICD-10-CM | POA: Diagnosis not present

## 2015-09-22 DIAGNOSIS — M9903 Segmental and somatic dysfunction of lumbar region: Secondary | ICD-10-CM | POA: Diagnosis not present

## 2015-09-22 DIAGNOSIS — M545 Low back pain: Secondary | ICD-10-CM | POA: Diagnosis not present

## 2015-09-22 DIAGNOSIS — M9905 Segmental and somatic dysfunction of pelvic region: Secondary | ICD-10-CM | POA: Diagnosis not present

## 2015-09-27 DIAGNOSIS — M9905 Segmental and somatic dysfunction of pelvic region: Secondary | ICD-10-CM | POA: Diagnosis not present

## 2015-09-27 DIAGNOSIS — M9903 Segmental and somatic dysfunction of lumbar region: Secondary | ICD-10-CM | POA: Diagnosis not present

## 2015-09-28 DIAGNOSIS — M5386 Other specified dorsopathies, lumbar region: Secondary | ICD-10-CM | POA: Diagnosis not present

## 2015-09-28 DIAGNOSIS — M9905 Segmental and somatic dysfunction of pelvic region: Secondary | ICD-10-CM | POA: Diagnosis not present

## 2015-09-28 DIAGNOSIS — M9903 Segmental and somatic dysfunction of lumbar region: Secondary | ICD-10-CM | POA: Diagnosis not present

## 2015-09-28 DIAGNOSIS — M4606 Spinal enthesopathy, lumbar region: Secondary | ICD-10-CM | POA: Diagnosis not present

## 2015-09-29 DIAGNOSIS — M25562 Pain in left knee: Secondary | ICD-10-CM | POA: Diagnosis not present

## 2015-09-29 DIAGNOSIS — M9903 Segmental and somatic dysfunction of lumbar region: Secondary | ICD-10-CM | POA: Diagnosis not present

## 2015-09-29 DIAGNOSIS — M1712 Unilateral primary osteoarthritis, left knee: Secondary | ICD-10-CM | POA: Diagnosis not present

## 2015-09-29 DIAGNOSIS — M9905 Segmental and somatic dysfunction of pelvic region: Secondary | ICD-10-CM | POA: Diagnosis not present

## 2015-10-04 DIAGNOSIS — M5386 Other specified dorsopathies, lumbar region: Secondary | ICD-10-CM | POA: Diagnosis not present

## 2015-10-04 DIAGNOSIS — M461 Sacroiliitis, not elsewhere classified: Secondary | ICD-10-CM | POA: Diagnosis not present

## 2015-10-04 DIAGNOSIS — M9903 Segmental and somatic dysfunction of lumbar region: Secondary | ICD-10-CM | POA: Diagnosis not present

## 2015-10-04 DIAGNOSIS — M47817 Spondylosis without myelopathy or radiculopathy, lumbosacral region: Secondary | ICD-10-CM | POA: Diagnosis not present

## 2015-10-04 DIAGNOSIS — M545 Low back pain: Secondary | ICD-10-CM | POA: Diagnosis not present

## 2015-10-04 DIAGNOSIS — M5416 Radiculopathy, lumbar region: Secondary | ICD-10-CM | POA: Diagnosis not present

## 2015-10-04 DIAGNOSIS — M9905 Segmental and somatic dysfunction of pelvic region: Secondary | ICD-10-CM | POA: Diagnosis not present

## 2015-10-04 DIAGNOSIS — M4606 Spinal enthesopathy, lumbar region: Secondary | ICD-10-CM | POA: Diagnosis not present

## 2015-10-05 DIAGNOSIS — M9905 Segmental and somatic dysfunction of pelvic region: Secondary | ICD-10-CM | POA: Diagnosis not present

## 2015-10-05 DIAGNOSIS — M1712 Unilateral primary osteoarthritis, left knee: Secondary | ICD-10-CM | POA: Diagnosis not present

## 2015-10-05 DIAGNOSIS — M25562 Pain in left knee: Secondary | ICD-10-CM | POA: Diagnosis not present

## 2015-10-05 DIAGNOSIS — M9903 Segmental and somatic dysfunction of lumbar region: Secondary | ICD-10-CM | POA: Diagnosis not present

## 2015-10-06 DIAGNOSIS — M9903 Segmental and somatic dysfunction of lumbar region: Secondary | ICD-10-CM | POA: Diagnosis not present

## 2015-10-06 DIAGNOSIS — M9905 Segmental and somatic dysfunction of pelvic region: Secondary | ICD-10-CM | POA: Diagnosis not present

## 2015-10-06 DIAGNOSIS — M5386 Other specified dorsopathies, lumbar region: Secondary | ICD-10-CM | POA: Diagnosis not present

## 2015-10-06 DIAGNOSIS — M4606 Spinal enthesopathy, lumbar region: Secondary | ICD-10-CM | POA: Diagnosis not present

## 2015-10-13 DIAGNOSIS — M9903 Segmental and somatic dysfunction of lumbar region: Secondary | ICD-10-CM | POA: Diagnosis not present

## 2015-10-13 DIAGNOSIS — M9905 Segmental and somatic dysfunction of pelvic region: Secondary | ICD-10-CM | POA: Diagnosis not present

## 2015-10-18 DIAGNOSIS — M9903 Segmental and somatic dysfunction of lumbar region: Secondary | ICD-10-CM | POA: Diagnosis not present

## 2015-10-18 DIAGNOSIS — M9905 Segmental and somatic dysfunction of pelvic region: Secondary | ICD-10-CM | POA: Diagnosis not present

## 2015-10-20 DIAGNOSIS — M9905 Segmental and somatic dysfunction of pelvic region: Secondary | ICD-10-CM | POA: Diagnosis not present

## 2015-10-20 DIAGNOSIS — M9903 Segmental and somatic dysfunction of lumbar region: Secondary | ICD-10-CM | POA: Diagnosis not present

## 2015-10-21 DIAGNOSIS — M9905 Segmental and somatic dysfunction of pelvic region: Secondary | ICD-10-CM | POA: Diagnosis not present

## 2015-10-21 DIAGNOSIS — M9903 Segmental and somatic dysfunction of lumbar region: Secondary | ICD-10-CM | POA: Diagnosis not present

## 2015-10-25 DIAGNOSIS — M545 Low back pain: Secondary | ICD-10-CM | POA: Diagnosis not present

## 2015-10-25 DIAGNOSIS — M5382 Other specified dorsopathies, cervical region: Secondary | ICD-10-CM | POA: Diagnosis not present

## 2015-10-25 DIAGNOSIS — M5384 Other specified dorsopathies, thoracic region: Secondary | ICD-10-CM | POA: Diagnosis not present

## 2015-10-25 DIAGNOSIS — M4604 Spinal enthesopathy, thoracic region: Secondary | ICD-10-CM | POA: Diagnosis not present

## 2015-10-25 DIAGNOSIS — M4602 Spinal enthesopathy, cervical region: Secondary | ICD-10-CM | POA: Diagnosis not present

## 2015-10-25 DIAGNOSIS — M461 Sacroiliitis, not elsewhere classified: Secondary | ICD-10-CM | POA: Diagnosis not present

## 2015-10-28 DIAGNOSIS — M9905 Segmental and somatic dysfunction of pelvic region: Secondary | ICD-10-CM | POA: Diagnosis not present

## 2015-10-28 DIAGNOSIS — M9903 Segmental and somatic dysfunction of lumbar region: Secondary | ICD-10-CM | POA: Diagnosis not present

## 2015-11-01 DIAGNOSIS — M4604 Spinal enthesopathy, thoracic region: Secondary | ICD-10-CM | POA: Diagnosis not present

## 2015-11-01 DIAGNOSIS — M9905 Segmental and somatic dysfunction of pelvic region: Secondary | ICD-10-CM | POA: Diagnosis not present

## 2015-11-01 DIAGNOSIS — M5382 Other specified dorsopathies, cervical region: Secondary | ICD-10-CM | POA: Diagnosis not present

## 2015-11-01 DIAGNOSIS — M4602 Spinal enthesopathy, cervical region: Secondary | ICD-10-CM | POA: Diagnosis not present

## 2015-11-01 DIAGNOSIS — M9903 Segmental and somatic dysfunction of lumbar region: Secondary | ICD-10-CM | POA: Diagnosis not present

## 2015-11-01 DIAGNOSIS — M545 Low back pain: Secondary | ICD-10-CM | POA: Diagnosis not present

## 2015-11-01 DIAGNOSIS — M461 Sacroiliitis, not elsewhere classified: Secondary | ICD-10-CM | POA: Diagnosis not present

## 2015-11-01 DIAGNOSIS — M5384 Other specified dorsopathies, thoracic region: Secondary | ICD-10-CM | POA: Diagnosis not present

## 2015-11-02 DIAGNOSIS — N401 Enlarged prostate with lower urinary tract symptoms: Secondary | ICD-10-CM | POA: Diagnosis not present

## 2015-11-02 DIAGNOSIS — N3281 Overactive bladder: Secondary | ICD-10-CM | POA: Diagnosis not present

## 2015-11-02 DIAGNOSIS — Z86008 Personal history of in-situ neoplasm of other site: Secondary | ICD-10-CM | POA: Diagnosis not present

## 2015-11-02 DIAGNOSIS — Z0000 Encounter for general adult medical examination without abnormal findings: Secondary | ICD-10-CM | POA: Diagnosis not present

## 2015-11-02 DIAGNOSIS — N138 Other obstructive and reflux uropathy: Secondary | ICD-10-CM | POA: Diagnosis not present

## 2015-11-03 DIAGNOSIS — E119 Type 2 diabetes mellitus without complications: Secondary | ICD-10-CM | POA: Diagnosis not present

## 2015-11-04 DIAGNOSIS — I10 Essential (primary) hypertension: Secondary | ICD-10-CM | POA: Diagnosis not present

## 2015-11-07 DIAGNOSIS — M4602 Spinal enthesopathy, cervical region: Secondary | ICD-10-CM | POA: Diagnosis not present

## 2015-11-07 DIAGNOSIS — M9905 Segmental and somatic dysfunction of pelvic region: Secondary | ICD-10-CM | POA: Diagnosis not present

## 2015-11-07 DIAGNOSIS — M4604 Spinal enthesopathy, thoracic region: Secondary | ICD-10-CM | POA: Diagnosis not present

## 2015-11-07 DIAGNOSIS — M545 Low back pain: Secondary | ICD-10-CM | POA: Diagnosis not present

## 2015-11-07 DIAGNOSIS — M461 Sacroiliitis, not elsewhere classified: Secondary | ICD-10-CM | POA: Diagnosis not present

## 2015-11-07 DIAGNOSIS — M9903 Segmental and somatic dysfunction of lumbar region: Secondary | ICD-10-CM | POA: Diagnosis not present

## 2015-11-07 DIAGNOSIS — M5384 Other specified dorsopathies, thoracic region: Secondary | ICD-10-CM | POA: Diagnosis not present

## 2015-11-07 DIAGNOSIS — M5382 Other specified dorsopathies, cervical region: Secondary | ICD-10-CM | POA: Diagnosis not present

## 2015-11-08 DIAGNOSIS — M9903 Segmental and somatic dysfunction of lumbar region: Secondary | ICD-10-CM | POA: Diagnosis not present

## 2015-11-08 DIAGNOSIS — M9905 Segmental and somatic dysfunction of pelvic region: Secondary | ICD-10-CM | POA: Diagnosis not present

## 2015-11-10 DIAGNOSIS — M9905 Segmental and somatic dysfunction of pelvic region: Secondary | ICD-10-CM | POA: Diagnosis not present

## 2015-11-10 DIAGNOSIS — M9903 Segmental and somatic dysfunction of lumbar region: Secondary | ICD-10-CM | POA: Diagnosis not present

## 2015-11-11 DIAGNOSIS — Z0000 Encounter for general adult medical examination without abnormal findings: Secondary | ICD-10-CM | POA: Diagnosis not present

## 2015-11-11 DIAGNOSIS — I10 Essential (primary) hypertension: Secondary | ICD-10-CM | POA: Diagnosis not present

## 2015-11-11 DIAGNOSIS — E119 Type 2 diabetes mellitus without complications: Secondary | ICD-10-CM | POA: Diagnosis not present

## 2015-11-11 DIAGNOSIS — F4310 Post-traumatic stress disorder, unspecified: Secondary | ICD-10-CM | POA: Diagnosis not present

## 2015-11-18 DIAGNOSIS — M4604 Spinal enthesopathy, thoracic region: Secondary | ICD-10-CM | POA: Diagnosis not present

## 2015-11-18 DIAGNOSIS — M9903 Segmental and somatic dysfunction of lumbar region: Secondary | ICD-10-CM | POA: Diagnosis not present

## 2015-11-18 DIAGNOSIS — M9905 Segmental and somatic dysfunction of pelvic region: Secondary | ICD-10-CM | POA: Diagnosis not present

## 2015-11-18 DIAGNOSIS — M5384 Other specified dorsopathies, thoracic region: Secondary | ICD-10-CM | POA: Diagnosis not present

## 2015-11-28 DIAGNOSIS — M461 Sacroiliitis, not elsewhere classified: Secondary | ICD-10-CM | POA: Diagnosis not present

## 2015-11-28 DIAGNOSIS — M545 Low back pain: Secondary | ICD-10-CM | POA: Diagnosis not present

## 2015-11-28 DIAGNOSIS — M5416 Radiculopathy, lumbar region: Secondary | ICD-10-CM | POA: Diagnosis not present

## 2015-11-28 DIAGNOSIS — M5384 Other specified dorsopathies, thoracic region: Secondary | ICD-10-CM | POA: Diagnosis not present

## 2015-11-28 DIAGNOSIS — M47817 Spondylosis without myelopathy or radiculopathy, lumbosacral region: Secondary | ICD-10-CM | POA: Diagnosis not present

## 2015-11-28 DIAGNOSIS — M4604 Spinal enthesopathy, thoracic region: Secondary | ICD-10-CM | POA: Diagnosis not present

## 2015-12-06 DIAGNOSIS — H52223 Regular astigmatism, bilateral: Secondary | ICD-10-CM | POA: Diagnosis not present

## 2015-12-06 DIAGNOSIS — H401211 Low-tension glaucoma, right eye, mild stage: Secondary | ICD-10-CM | POA: Diagnosis not present

## 2015-12-06 DIAGNOSIS — H11423 Conjunctival edema, bilateral: Secondary | ICD-10-CM | POA: Diagnosis not present

## 2015-12-06 DIAGNOSIS — H524 Presbyopia: Secondary | ICD-10-CM | POA: Diagnosis not present

## 2015-12-06 DIAGNOSIS — Z9849 Cataract extraction status, unspecified eye: Secondary | ICD-10-CM | POA: Diagnosis not present

## 2015-12-06 DIAGNOSIS — H401221 Low-tension glaucoma, left eye, mild stage: Secondary | ICD-10-CM | POA: Diagnosis not present

## 2015-12-06 DIAGNOSIS — H11153 Pinguecula, bilateral: Secondary | ICD-10-CM | POA: Diagnosis not present

## 2015-12-06 DIAGNOSIS — H18413 Arcus senilis, bilateral: Secondary | ICD-10-CM | POA: Diagnosis not present

## 2015-12-06 DIAGNOSIS — Z961 Presence of intraocular lens: Secondary | ICD-10-CM | POA: Diagnosis not present

## 2015-12-06 DIAGNOSIS — H5202 Hypermetropia, left eye: Secondary | ICD-10-CM | POA: Diagnosis not present

## 2016-02-07 DIAGNOSIS — G629 Polyneuropathy, unspecified: Secondary | ICD-10-CM | POA: Diagnosis not present

## 2016-02-13 DIAGNOSIS — Z8551 Personal history of malignant neoplasm of bladder: Secondary | ICD-10-CM | POA: Diagnosis not present

## 2016-02-13 DIAGNOSIS — N5201 Erectile dysfunction due to arterial insufficiency: Secondary | ICD-10-CM | POA: Diagnosis not present

## 2016-02-13 DIAGNOSIS — R319 Hematuria, unspecified: Secondary | ICD-10-CM | POA: Diagnosis not present

## 2016-02-15 DIAGNOSIS — G8929 Other chronic pain: Secondary | ICD-10-CM | POA: Diagnosis not present

## 2016-02-15 DIAGNOSIS — M545 Low back pain: Secondary | ICD-10-CM | POA: Diagnosis not present

## 2016-02-24 DIAGNOSIS — E291 Testicular hypofunction: Secondary | ICD-10-CM | POA: Diagnosis not present

## 2016-05-03 DIAGNOSIS — I10 Essential (primary) hypertension: Secondary | ICD-10-CM | POA: Diagnosis not present

## 2016-05-03 DIAGNOSIS — F4310 Post-traumatic stress disorder, unspecified: Secondary | ICD-10-CM | POA: Diagnosis not present

## 2016-05-03 DIAGNOSIS — E119 Type 2 diabetes mellitus without complications: Secondary | ICD-10-CM | POA: Diagnosis not present

## 2016-05-24 DIAGNOSIS — H04123 Dry eye syndrome of bilateral lacrimal glands: Secondary | ICD-10-CM | POA: Diagnosis not present

## 2016-05-24 DIAGNOSIS — N5201 Erectile dysfunction due to arterial insufficiency: Secondary | ICD-10-CM | POA: Diagnosis not present

## 2016-05-24 DIAGNOSIS — H401232 Low-tension glaucoma, bilateral, moderate stage: Secondary | ICD-10-CM | POA: Diagnosis not present

## 2016-05-24 DIAGNOSIS — H47233 Glaucomatous optic atrophy, bilateral: Secondary | ICD-10-CM | POA: Diagnosis not present

## 2016-05-24 DIAGNOSIS — H11153 Pinguecula, bilateral: Secondary | ICD-10-CM | POA: Diagnosis not present

## 2016-05-24 DIAGNOSIS — Z8551 Personal history of malignant neoplasm of bladder: Secondary | ICD-10-CM | POA: Diagnosis not present

## 2016-05-24 DIAGNOSIS — N401 Enlarged prostate with lower urinary tract symptoms: Secondary | ICD-10-CM | POA: Diagnosis not present

## 2016-05-24 DIAGNOSIS — H18413 Arcus senilis, bilateral: Secondary | ICD-10-CM | POA: Diagnosis not present

## 2016-05-24 DIAGNOSIS — E291 Testicular hypofunction: Secondary | ICD-10-CM | POA: Diagnosis not present

## 2016-05-24 DIAGNOSIS — H11423 Conjunctival edema, bilateral: Secondary | ICD-10-CM | POA: Diagnosis not present

## 2016-05-24 DIAGNOSIS — N3941 Urge incontinence: Secondary | ICD-10-CM | POA: Diagnosis not present

## 2016-06-14 DIAGNOSIS — E119 Type 2 diabetes mellitus without complications: Secondary | ICD-10-CM | POA: Diagnosis not present

## 2016-06-14 DIAGNOSIS — K219 Gastro-esophageal reflux disease without esophagitis: Secondary | ICD-10-CM | POA: Diagnosis not present

## 2016-06-14 DIAGNOSIS — F4310 Post-traumatic stress disorder, unspecified: Secondary | ICD-10-CM | POA: Diagnosis not present

## 2016-06-14 DIAGNOSIS — I10 Essential (primary) hypertension: Secondary | ICD-10-CM | POA: Diagnosis not present

## 2016-06-22 ENCOUNTER — Encounter: Payer: Self-pay | Admitting: Gastroenterology

## 2016-06-22 ENCOUNTER — Ambulatory Visit (INDEPENDENT_AMBULATORY_CARE_PROVIDER_SITE_OTHER): Payer: Medicare Other | Admitting: Gastroenterology

## 2016-06-22 VITALS — BP 178/78 | HR 80 | Ht 74.0 in | Wt 217.0 lb

## 2016-06-22 DIAGNOSIS — K219 Gastro-esophageal reflux disease without esophagitis: Secondary | ICD-10-CM | POA: Diagnosis not present

## 2016-06-22 DIAGNOSIS — K5900 Constipation, unspecified: Secondary | ICD-10-CM

## 2016-06-22 MED ORDER — OMEPRAZOLE 40 MG PO CPDR: DELAYED_RELEASE_CAPSULE | ORAL | 3 refills | Status: AC

## 2016-06-22 NOTE — Patient Instructions (Signed)
We have given you a printed prescription for Omeprazole 40 , twice daily.   Try daily prunes and or prune juice. You can use Miralax daily also.

## 2016-06-22 NOTE — Progress Notes (Signed)
06/22/2016 [name] [id number] 1932-12-23   HISTORY OF PRESENT ILLNESS:  This is an 82 year old male who is known to Dr. Carlean Purl.  He had both a colonoscopy and EGD in November 2013. At that time EGD revealed some nonerosive gastritis with positive H. pylori on biopsies. He was treated at that time. Colonoscopy revealed 1 polyp that was removed and was a tubular adenoma. Also had some stenosis at his ileocolonic anastomosis at the transverse colon (has history of colon cancer); due for colonoscopy 06/2017. He was referred to our office today at request of his PCP, Dr. Maudie Mercury, for complaints of reflux. He tells me that he has been taking omeprazole 40 mg daily for a long time but recently it seems like it is not helping as much as it used to. Reports intermittent reflux. Also reports intermittent episodes of random vomiting after eating over the past 6 months. He admits that his diet is not great, eating at [employer] a lot.  He also reports constipation. Says that he has taken MiraLAX on some occasions in the past, which seemed to help. Also says that his wife recently bought some prune juice and was considering trying that.  He is not a great historian, hard to get direct answers and a lot of detailed information regarding his symptoms.  Says that he needs to start writing stuff down.  Denies abdominal pain.  No rectal bleeding.   Past Medical History:  Diagnosis Date  . Allergic rhinitis   . Anemia   . Anxiety   . Bladder tumor    WAS HAVING BLOOD IN URINE - CLEARED BUT OCCAS CLOT NOW  . BPH (benign prostatic hypertrophy)    FREQUENT URINATION   . Cancer ([place])   . Cataract    bilateral extrraction and lens implants '88-,'89  . Depression   . Diverticulosis   . DJD (degenerative joint disease)   . DM2 (diabetes mellitus, type 2) ([place])   . Dyspepsia   . GERD (gastroesophageal reflux disease)   . Glaucoma   . Glaucoma   . Headache   . Hearing loss    bilateral  . History of colon cancer   . Hypertension   . Multiple pulmonary nodules    SEEN ON CT CHEST DONE 05/25/14 AT [facility]  . Nonsustained ventricular tachycardia ([facility])    PAST HISTORY  . Osteoporosis   . Peripheral neuropathy ([place])   . Posttraumatic stress disorder   . Restless leg syndrome   . Tubular adenoma 02/17/2008   Past Surgical History:  Procedure Laterality Date  . COLON SURGERY     for colon cancer march 2005  . COLONOSCOPY    . CYSTO N/A 06/03/2014   Procedure: CYSTO;  Surgeon: [name], MD;  Location: [facility];  Service: Urology;  Laterality: N/A;  . CYSTOSCOPY WITH URETEROSCOPY Bilateral 07/26/2015   Procedure: CYSTO, BILATERAL RENAL WASHING WITH    URETEROSCOPY, TUR PROSTATE BIOPSY;  Surgeon: [name], MD;  Location: [facility];  Service: Urology;  Laterality: Bilateral;  . EYE SURGERY     bilateral cataract surgery   . INNER EAR SURGERY     excision of tumor, then two surgeries: prosthesis, then had to remove.  '10  . JOINT REPLACEMENT    . KNEE ARTHROSCOPY    . PROSTATE BIOPSY N/A 07/26/2015   Procedure: PROSTATE BIOPSY;  Surgeon: [name], MD;  Location: [facility];  Service: Urology;  Laterality:  N/A;  . TOTAL KNEE ARTHROPLASTY     right  . TRANSURETHRAL RESECTION OF BLADDER TUMOR N/A 06/24/2014   Procedure: TRANSURETHRAL RESECTION OF BLADDER TUMOR (TURBT);  Surgeon: [name], MD;  Location: [facility];  Service: Urology;  Laterality: N/A;  . TRANSURETHRAL RESECTION OF BLADDER TUMOR WITH GYRUS (TURBT-GYRUS) N/A 06/03/2014   Procedure: TRANSURETHRAL RESECTION OF BLADDER TUMOR WITH GYRUS (TURBT-GYRUS);  Surgeon: [name], MD;  Location: [facility];  Service: Urology;  Laterality: N/A;  . TRANSURETHRAL RESECTION OF PROSTATE N/A 07/26/2015   Procedure: BLADDER TRANSURETHRAL RESECTION ;  Surgeon: [name], MD;  Location: [facility];  Service: Urology;  Laterality: N/A;    reports that he has quit smoking. He has never used smokeless tobacco.  He reports that he does not drink alcohol or use drugs. family history includes Breast cancer in his mother and sister; Cancer in his brother and son; Diabetes in his mother; Kidney disease in his mother; Lung cancer in his father; Pancreatic cancer in his sister; Prostate cancer in his brother. No Known Allergies    Outpatient Encounter Prescriptions as of 06/22/2016  Medication Sig  . acetaminophen (TYLENOL) 325 MG tablet Take 650 mg by mouth every 6 (six) hours as needed for mild pain.   Marland Kitchen ammonium lactate (LAC-HYDRIN) 12 % lotion Apply 1 application topically daily as needed for dry skin.  Marland Kitchen aspirin 81 MG tablet Take 81 mg by mouth daily as needed for pain.   Marland Kitchen atenolol (TENORMIN) 25 MG tablet Take 25 mg by mouth every morning.   Marland Kitchen atorvastatin (LIPITOR) 40 MG tablet Take 40 mg by mouth daily.  . bisacodyl (DULCOLAX) 10 MG suppository Place 10 mg rectally daily as needed for moderate constipation.  . Coenzyme Q10 (CO Q 10) 100 MG CAPS Take 100 mg by mouth 2 (two) times daily.  . finasteride (PROSCAR) 5 MG tablet Take 5 mg by mouth at bedtime.  . gabapentin (NEURONTIN) 100 MG capsule Take 100 mg by mouth 3 (three) times daily.  Marland Kitchen latanoprost (XALATAN) 0.005 % ophthalmic solution Place 1 drop into the right eye at bedtime.   Marland Kitchen omega-3 acid ethyl esters (LOVAZA) 1 g capsule Take 1 g by mouth daily at 2 PM.  . omeprazole (PRILOSEC) 20 MG capsule Take 20 mg by mouth 2 (two) times daily.   Marland Kitchen terazosin (HYTRIN) 2 MG capsule Take 2 mg by mouth at bedtime.  . [DISCONTINUED] amLODipine (NORVASC) 5 MG tablet Take 5 mg by mouth daily.  . [DISCONTINUED] Calcium Carbonate-Vitamin D (CALCIUM 500 + D PO) Take 1 tablet by mouth daily.   . [DISCONTINUED] cetirizine (ZYRTEC) 10 MG tablet Take 10 mg by mouth daily as needed for allergies.   . [DISCONTINUED] Cholecalciferol (VITAMIN D-3) 1000 UNITS CAPS Take 1 capsule by mouth daily.  . [DISCONTINUED] clonazePAM (KLONOPIN) 0.5 MG tablet Take 0.5 mg by mouth 2  (two) times daily as needed for anxiety.   . [DISCONTINUED] Cyanocobalamin (VITAMIN B-12 PO) Take 50 mcg by mouth daily.   . [DISCONTINUED] guaiFENesin (MUCINEX) 600 MG 12 hr tablet Take 600 mg by mouth 2 (two) times daily as needed for cough or to loosen phlegm.  . [DISCONTINUED] HYDROcodone-acetaminophen (NORCO/VICODIN) 5-325 MG tablet Take 1-2 tablets by mouth every 4 (four) hours as needed for moderate pain.  . [DISCONTINUED] lactose free nutrition (BOOST) LIQD Take 237 mLs by mouth daily.  . [DISCONTINUED] metFORMIN (GLUCOPHAGE) 500 MG tablet Take 500 mg by mouth 2 (two) times daily with a  meal.    . [DISCONTINUED] OVER THE COUNTER MEDICATION Take 1 packet by mouth daily. Ion Z supplement  . [DISCONTINUED] OVER THE COUNTER MEDICATION Take 2 oz by mouth daily. Rain Product "Soul" Pure Wellness antioxadant  . [DISCONTINUED] OVER THE COUNTER MEDICATION Take 1 tablet by mouth 3 (three) times a week. Rain Product "Core" Nutrients  . [DISCONTINUED] OVER THE COUNTER MEDICATION Take 1 capsule by mouth daily as needed. Nugenix- testosterone booster  . [DISCONTINUED] OVER THE COUNTER MEDICATION Take 1 tablet by mouth daily as needed (Blackcore Edget Max for male enhancement).  . [DISCONTINUED] oxybutynin (DITROPAN-XL) 10 MG 24 hr tablet Take 10 mg by mouth daily.  . [DISCONTINUED] Probiotic Product (ALIGN) 4 MG CAPS Take 1 capsule by mouth daily.  . [DISCONTINUED] Probiotic Product (PROBIOTIC DAILY PO) Take 4 tablets by mouth daily.  . [DISCONTINUED] sildenafil (VIAGRA) 100 MG tablet Take 100 mg by mouth daily as needed for erectile dysfunction.  . [DISCONTINUED] venlafaxine (EFFEXOR-XR) 75 MG 24 hr capsule Take 75 mg by mouth 2 (two) times daily.     No facility-administered encounter medications on file as of 06/22/2016.      REVIEW OF SYSTEMS  : All other systems reviewed and negative except where noted in the History of Present Illness.   PHYSICAL EXAM: BP (!) 178/78   Pulse 80   Ht 6\' 2"   (1.88 m)   Wt 217 lb (98.4 kg)   BMI 27.86 kg/m  General: Well developed black male in no acute distress Head: Normocephalic and atraumatic Eyes:  Sclerae anicteric, conjunctiva pink. Ears: Normal auditory acuity Lungs: Clear throughout to auscultation Heart: Regular rate and rhythm Abdomen: Soft, non-distended.  BS present.  Non-tender. Musculoskeletal: Symmetrical with no gross deformities  Skin: No lesions on visible extremities Extremities: No edema  Neurological: Alert oriented x 4, grossly non-focal Psychological:  Alert and cooperative. Normal mood and affect  ASSESSMENT AND PLAN: -GERD:  Still with symptoms despite omeprazole 40 mg daily.  Will increase to BID for now.  ? If needs to change PPI.  Had Hpylori previously that was treated, but questionable if patient took medication as directed.  May need to check breath test or stool Ag if symptoms continue. -Vomiting:  Random intermittent episodes over the past 6 months.  ? Acid related.  Will see if increasing PPI helps. -Constipation:  Had some stenosis at site of anastomosis on last colonoscopy.  Would like to try prune juice/prunes first, but we also discussed using Miralax daily as well, which he has used in limited amounts in the past.  *Follow-up with Dr. Carlean Purl in approximately 6 weeks.   CC:  Jani Gravel, MD

## 2016-06-22 NOTE — Progress Notes (Signed)
Agree with [name] management.  Bid PPI reasonable Can consider different PPI also It is possible treatment and relief of constipation may help H. Pylori usually causes more ulcer-like dyspepsia as opposed to GERD sxs    Gatha Mayer, MD, [facility]

## 2016-08-23 ENCOUNTER — Ambulatory Visit: Payer: Medicare Other | Admitting: Internal Medicine

## 2016-08-23 DIAGNOSIS — H11423 Conjunctival edema, bilateral: Secondary | ICD-10-CM | POA: Diagnosis not present

## 2016-08-23 DIAGNOSIS — Z9849 Cataract extraction status, unspecified eye: Secondary | ICD-10-CM | POA: Diagnosis not present

## 2016-08-23 DIAGNOSIS — H401232 Low-tension glaucoma, bilateral, moderate stage: Secondary | ICD-10-CM | POA: Diagnosis not present

## 2016-08-23 DIAGNOSIS — H04123 Dry eye syndrome of bilateral lacrimal glands: Secondary | ICD-10-CM | POA: Diagnosis not present

## 2016-08-23 DIAGNOSIS — H18413 Arcus senilis, bilateral: Secondary | ICD-10-CM | POA: Diagnosis not present

## 2016-08-23 DIAGNOSIS — H47233 Glaucomatous optic atrophy, bilateral: Secondary | ICD-10-CM | POA: Diagnosis not present

## 2016-08-23 DIAGNOSIS — Z961 Presence of intraocular lens: Secondary | ICD-10-CM | POA: Diagnosis not present

## 2016-08-23 DIAGNOSIS — E119 Type 2 diabetes mellitus without complications: Secondary | ICD-10-CM | POA: Diagnosis not present

## 2016-08-23 DIAGNOSIS — Z7984 Long term (current) use of oral hypoglycemic drugs: Secondary | ICD-10-CM | POA: Diagnosis not present

## 2016-08-23 DIAGNOSIS — H11153 Pinguecula, bilateral: Secondary | ICD-10-CM | POA: Diagnosis not present

## 2016-08-29 ENCOUNTER — Ambulatory Visit: Payer: No Typology Code available for payment source | Admitting: Physical Therapy

## 2016-08-31 ENCOUNTER — Encounter: Payer: Self-pay | Admitting: Physical Therapy

## 2016-08-31 ENCOUNTER — Ambulatory Visit: Payer: Medicare Other | Attending: Internal Medicine | Admitting: Physical Therapy

## 2016-08-31 DIAGNOSIS — Z9181 History of falling: Secondary | ICD-10-CM

## 2016-08-31 DIAGNOSIS — M6281 Muscle weakness (generalized): Secondary | ICD-10-CM

## 2016-08-31 DIAGNOSIS — R2689 Other abnormalities of gait and mobility: Secondary | ICD-10-CM

## 2016-08-31 NOTE — Therapy (Signed)
[facility] [address] [place], [place], [address] Phone: [phone]   Fax:  [phone]  Physical Therapy Evaluation  Patient Details  Name: [name] MRN: [id number] Date of Birth: 1932-10-17 Referring Provider: Dr. Marsh [name]  Encounter Date: 08/31/2016      PT End of Session - 08/31/16 1459    Visit Number 1   Number of Visits 12   Authorization Type [facility]--also has medicare and tricare; 12 visits over 8 weeks   Authorization Time Period 08/31/16 to 10/30/16   PT Start Time 1320   PT Stop Time 1405   PT Time Calculation (min) 45 min   Activity Tolerance Patient tolerated treatment well   Behavior During Therapy [facility] for tasks assessed/performed      Past Medical History:  Diagnosis Date  . Allergic rhinitis   . Anemia   . Anxiety   . Bladder tumor    WAS HAVING BLOOD IN URINE - CLEARED BUT OCCAS CLOT NOW  . BPH (benign prostatic hypertrophy)    FREQUENT URINATION   . Cancer ([place])   . Cataract    bilateral extrraction and lens implants '88-,'89  . Depression   . Diverticulosis   . DJD (degenerative joint disease)   . DM2 (diabetes mellitus, type 2) ([place])   . Dyspepsia   . GERD (gastroesophageal reflux disease)   . Glaucoma   . Glaucoma   . Headache   . Hearing loss    bilateral  . History of colon cancer   . Hypertension   . Multiple pulmonary nodules    SEEN ON CT CHEST DONE 05/25/14 AT [facility]  . Nonsustained ventricular tachycardia ([facility])    PAST HISTORY  . Osteoporosis   . Peripheral neuropathy ([place])   . Posttraumatic stress disorder   . Restless leg syndrome   . Tubular adenoma 02/17/2008    Past Surgical History:  Procedure Laterality Date  . COLON SURGERY     for colon cancer march 2005  . COLONOSCOPY    . CYSTO N/A 06/03/2014   Procedure: CYSTO;  Surgeon: [name], MD;  Location: [facility];  Service: Urology;  Laterality: N/A;  . CYSTOSCOPY WITH  URETEROSCOPY Bilateral 07/26/2015   Procedure: CYSTO, BILATERAL RENAL WASHING WITH    URETEROSCOPY, TUR PROSTATE BIOPSY;  Surgeon: [name], MD;  Location: [facility];  Service: Urology;  Laterality: Bilateral;  . EYE SURGERY     bilateral cataract surgery   . INNER EAR SURGERY     excision of tumor, then two surgeries: prosthesis, then had to remove.  '10  . JOINT REPLACEMENT    . KNEE ARTHROSCOPY    . PROSTATE BIOPSY N/A 07/26/2015   Procedure: PROSTATE BIOPSY;  Surgeon: [name], MD;  Location: [facility];  Service: Urology;  Laterality: N/A;  . TOTAL KNEE ARTHROPLASTY     right  . TRANSURETHRAL RESECTION OF BLADDER TUMOR N/A 06/24/2014   Procedure: TRANSURETHRAL RESECTION OF BLADDER TUMOR (TURBT);  Surgeon: [name], MD;  Location: [facility];  Service: Urology;  Laterality: N/A;  . TRANSURETHRAL RESECTION OF BLADDER TUMOR WITH GYRUS (TURBT-GYRUS) N/A 06/03/2014   Procedure: TRANSURETHRAL RESECTION OF BLADDER TUMOR WITH GYRUS (TURBT-GYRUS);  Surgeon: [name], MD;  Location: [facility];  Service: Urology;  Laterality: N/A;  . TRANSURETHRAL RESECTION OF PROSTATE N/A 07/26/2015   Procedure: BLADDER TRANSURETHRAL RESECTION ;  Surgeon: [name], MD;  Location: [facility];  Service: Urology;  Laterality: N/A;  There were no vitals filed for this visit.       Subjective Assessment - 08/31/16 1327    Subjective Reports he did well with PT in Jan 2017 (episode due to weakness s/p bladder cancer procedures). Since then his wife was diagnosed with stomach cancer and has not been as active for many months. Wants to limit/prevent falls and be able to go to the gym.    Patient Stated Goals limit/prevent falls; [facility]   Currently in Pain? Yes   Pain Score 7    Pain Location Knee  knees (Rt more than Lt) worse than low back   Pain Orientation Right   Pain Descriptors / Indicators Aching   Pain Type Chronic pain   Pain Onset More than a month ago   Pain Frequency  Constant   Aggravating Factors  any standing, walking   Pain Relieving Factors sitting   Effect of Pain on Daily Activities Has limited his ability to go out into community (ex. goes to church less, goes out to eat less)   Multiple Pain Sites Yes  see comment Emily Filbert PT Assessment - 08/31/16 0001      Assessment   Medical Diagnosis Low back pain, "pain all over" (per MD referral, pt reports knee pain and falls are his reasons for PT referral)   Referring Provider Dr. Marsh [name]   Onset Date/Surgical Date --  Chronic pain; referral approved by [facility] 06/27/16   Prior Therapy Jan-April 2017 at [facility]     Precautions   Precautions Fall   Precaution Comments h/o falls     Restrictions   Weight Bearing Restrictions No     Balance Screen   Has the patient fallen in the past 6 months Yes   How many times? 2-3 times per month   Has the patient had a decrease in activity level because of a fear of falling?  Yes   Is the patient reluctant to leave their home because of a fear of falling?  Yes     [place] Private residence   Living Arrangements Spouse/significant other   Available Help at Discharge Family;Personal care attendant  he pays attendant to drive him to appointments    Type of [place] entrance;Other (comment)  stair lift   Home Layout Two level;Able to live on main level with bedroom/bathroom  one bathroom [facility] by [facility]   Alternate Level Stairs-Number of Steps [place] scooter;Cane - single point;Walker - 4 wheels;Shower seat;Wheelchair - manual;Bedside commode  new bathroom with grab bars & the works; [facility] over toilet   Additional Comments has stair lift outside (5 steps) and inside (14 steps); New elliptical     Prior Function   Level of Independence Independent with household mobility with device;Independent with community mobility with device  device either cane  or rollator   Vocation Retired;On disability  retired Pharmacologist, Artist; retired Secondary school teacher   Leisure watch TV, play penuchle, go out to eat     Cognition   Overall Cognitive Status Within Functional Limits for tasks assessed     Observation/Other Assessments-Edema    Edema Circumferential  bil LEs (symmetrical); rt knee (chronic)     Sensation   Light Touch Impaired Detail  bil neuropathy (per pt due to back OA; began before DM)  Light Touch Impaired Details Impaired RLE;Impaired LLE   Additional Comments Patient reported tingling, sensitivity seems worse lately. Patient reported he has not consistently taken his neurontin.     Posture/Postural Control   Posture/Postural Control Postural limitations   Posture Comments walks with flexion at hips resulting in forwarding leaning trunk; appears due to h/o rt knee buckling to create knee extensor moment     ROM / Strength   AROM / PROM / Strength Strength     Strength   Overall Strength Deficits   Strength Assessment Site Knee   Right/Left Knee Right;Left   Right Knee Extension 3+/5   Left Knee Extension 4-/5     Transfers   Transfers Sit to Stand;Stand to Sit   Sit to Stand 6: Modified independent (Device/Increase time);With upper extremity assist   Five time sit to stand comments  14.87   Stand to Sit 6: Modified independent (Device/Increase time);With upper extremity assist     Ambulation/Gait   Ambulation/Gait Yes   Ambulation/Gait Assistance 5: Supervision   Ambulation/Gait Assistance Details supervision for safety due to reports of 2-3 falls per month   Ambulation Distance (Feet) 150 Feet   Assistive device Straight cane  appears cane too tall; uses in his right hand   Gait Pattern Step-through pattern;Decreased step length - right;Decreased step length - left;Decreased hip/knee flexion - right;Decreased hip/knee flexion - left;Decreased weight shift to right;Trunk flexed   Ambulation Surface Level;Indoor   Gait  velocity 12.97 sec; 2.53 ft/sec     Balance   Balance Assessed Yes     Standardized Balance Assessment   Standardized Balance Assessment Timed Up and Go Test;10 meter walk test   10 Meter Walk 12.97 sec     Timed Up and Go Test   TUG Normal TUG   Normal TUG (seconds) 13.63     High Level Balance   High Level Balance Comments SLS RLE=4 sec with min assist to prevent fall; LLE=2 seconds                           PT Education - 08/31/16 1457    Education provided Yes   Education Details PT Plan of care; requested he bring his previous HEP (reports he still has sheets, but has not been doing them since wife became sick)   Person(s) Educated Patient   Methods Explanation   Comprehension Verbalized understanding          PT Short Term Goals - 08/31/16 1526      PT SHORT TERM GOAL #1   Title Patient will verbalize understanding of fall prevention techniques (goal by 09-21-16)   Time 3   Period Weeks   Status New     PT SHORT TERM GOAL #2   Title Patient will ambulate 300 ft modified independent with least restrictive assistive device on indoor, level surfaces.    Time 3   Period Weeks   Status New     PT SHORT TERM GOAL #3   Title Patient will demonstrate independence with HEP for LE strengthening and balance.   Time 3   Period Weeks   Status New     PT SHORT TERM GOAL #4   Title Patient will demonstrate reduced fall risk as evidenced by improved TUG score to <13.5 seconds.   Time 3   Period Weeks   Status New     PT SHORT TERM GOAL #5   Title Patient will complete  Berg Balance Assessment to further assess fall risk and areas of impairment. LTG to be set as appropriate.           PT Long Term Goals - 08/31/16 1535      PT LONG TERM GOAL #1   Title Patient will be independent with HEP and able to verbalize appropriate exercises/machines for him to use [facility]. (target date 10-12-16)   Time 6   Period Weeks   Status  New     PT LONG TERM GOAL #2   Title Patient will demonstrate gait velocity >2.62 ft/sec (indicative of safe community ambulator).  (10-12-16)   Time 6   Period Weeks   Status New     PT LONG TERM GOAL #3   Title Patient will ambulate >500 ft modified independent with least restrictive device outdoors on unlevel, paved surfaces (i.e. sidewalk, parking lot).  (10-12-16)   Time 6   Period Weeks   Status New               Plan - 08/31/16 1512    Clinical Impression Statement Patient presents for PT evaluation with bil knee pain (Rt more than Lt) greater than low back pain. He reports chronic pain ("you're never going to fix that") and numerous falls (2-3x/month) are limiting his access to the community. His gait velocity of 2.53 ft/sec indicates he is unsafe to walk in the community (requires velocity >2.62 ft/sec with normal velocity 4.4 ft/sec). Timed Up and Go test completed in 13.63 seconds (greater than 13.5 sec indicative of increased fall risk). Patient demonstrates bil LE weakness, impaired balance and gait, and is experiencing frequent falls. He is agreeable to use of DME, however wants to remain as independent as possible ("I want to put off needing to use those devices as long as I can. If you don't use it, you lose it."). Patient will benefit from skilled PT interventions to improve his balance, gait, and reduce his fall risk.    Rehab Potential Good   PT Frequency 2x / week  [facility] approved 12 visits over 8 weeks   PT Duration 6 weeks   PT Treatment/Interventions ADLs/Self Care Home Management;DME Instruction;Gait training;Functional mobility training;Therapeutic activities;Therapeutic exercise;Balance training;Neuromuscular re-education;Patient/family education   PT Next Visit Plan Pt to bring prior HEP (from Apr 2017) to review; initiate HEP for LE strengthening and balance; assess cane height and proper use; do Berg; fall prevention handout if time   Consulted and Agree with Plan  of Care Patient      Patient will benefit from skilled therapeutic intervention in order to improve the following deficits and impairments:  Abnormal gait, Decreased balance, Decreased knowledge of use of DME, Decreased strength, Difficulty walking, Impaired sensation  Visit Diagnosis: Other abnormalities of gait and mobility - Plan: PT plan of care cert/re-cert  Muscle weakness (generalized) - Plan: PT plan of care cert/re-cert  History of falling - Plan: PT plan of care cert/re-cert      G-Codes - [id number]    Functional Assessment Tool Used TUG, Single limb stance, gait velocity   Functional Limitation Mobility: Walking and moving around   Mobility: Walking and Moving Around Current Status [id number]) At least 20 percent but less than 40 percent impaired, limited or restricted   Mobility: Walking and Moving Around Goal Status [id number]) At least 1 percent but less than 20 percent impaired, limited or restricted       Problem List Patient Active Problem  List   Diagnosis Date Noted  . Gastroesophageal reflux disease without esophagitis 06/22/2016  . Constipation 06/22/2016  . Bladder cancer ([place]) 06/03/2014  . Iron deficiency anemia, unspecified 06/13/2012  . Sleep disorder 04/16/2012  . Nonsustained ventricular tachycardia ([place]) 09/17/2011  . Atypical chest pain 08/27/2011  . GOUT, ACUTE 07/16/2008  . RENAL CYST, RIGHT 07/16/2008  . TRANSAMINASES, SERUM, ELEVATED 07/16/2008  . DIABETES MELLITUS, TYPE II 04/12/2008  . PERIPHERAL EDEMA 04/12/2008  . POSTTRAUMATIC STRESS DISORDER 08/11/2007  . PERIPHERAL NEUROPATHY 08/11/2007  . HYPERTENSION 08/11/2007  . ALLERGIC RHINITIS 08/11/2007  . BENIGN PROSTATIC HYPERTROPHY 08/11/2007  . DEGENERATIVE JOINT DISEASE 08/11/2007  . OSTEOPOROSIS 08/11/2007  . COLON CANCER, HX OF 08/11/2007  . Other postprocedural status(V45.89) 08/11/2007    Rexanne Mano, PT 08/31/2016, 4:27 PM  [facility] [address] [place], [place], [address] Phone: [phone]   Fax:  [phone]  Name: [name] MRN: [id number] Date of Birth: 06-20-1933

## 2016-09-06 ENCOUNTER — Ambulatory Visit: Payer: Medicare Other | Admitting: Physical Therapy

## 2016-09-06 DIAGNOSIS — M6281 Muscle weakness (generalized): Secondary | ICD-10-CM | POA: Diagnosis not present

## 2016-09-06 DIAGNOSIS — Z9181 History of falling: Secondary | ICD-10-CM | POA: Diagnosis not present

## 2016-09-06 DIAGNOSIS — R2689 Other abnormalities of gait and mobility: Secondary | ICD-10-CM | POA: Diagnosis not present

## 2016-09-06 NOTE — Patient Instructions (Addendum)
Knee to Chest (Flexion)    Pull knee toward chest. Feel stretch in lower back or buttock area. Breathing deeply, Hold __30__ seconds. Repeat with other knee. Repeat __3__ times. Do __2__ sessions per day.  http://gt2.exer.us/225   Copyright  VHI. All rights reserved.    Pelvic Tilt (Sitting)    Sitting pull belly button in and slouch forward (hips tuck under). Return to tall sitting with belly button being pulled forward toward wall in front of you. Relax. Repeat _10__ times. Do _2__ times a day. Advanced: Continue to hold squeeze through repetitions.  Copyright  VHI. All rights reserved.

## 2016-09-06 NOTE — Therapy (Signed)
[name] [address] [place], [place], [address] Phone: [phone]   Fax:  [phone]  Physical Therapy Treatment  Patient Details  Name: [name] MRN: [id number] Date of Birth: [birth year] Referring Provider: Dr. Marsh [name]  Encounter Date: 09/06/2016      PT End of Session - 09/06/16 2125    Visit Number 2   Number of Visits 12   Authorization Type VA--also has medicare and tricare; 12 visits over 8 weeks   Authorization Time Period 08/31/16 to 10/30/16   PT Start Time 0933   PT Stop Time 1023   PT Time Calculation (min) 50 min   Activity Tolerance Patient tolerated treatment well   Behavior During Therapy [facility] for tasks assessed/performed      Past Medical History:  Diagnosis Date  . Allergic rhinitis   . Anemia   . Anxiety   . Bladder tumor    WAS HAVING BLOOD IN URINE - CLEARED BUT OCCAS CLOT NOW  . BPH (benign prostatic hypertrophy)    FREQUENT URINATION   . Cancer ([place])   . Cataract    bilateral extrraction and lens implants '88-,'89  . Depression   . Diverticulosis   . DJD (degenerative joint disease)   . DM2 (diabetes mellitus, type 2) ([place])   . Dyspepsia   . GERD (gastroesophageal reflux disease)   . Glaucoma   . Glaucoma   . Headache   . Hearing loss    bilateral  . History of colon cancer   . Hypertension   . Multiple pulmonary nodules    SEEN ON CT CHEST DONE 05/25/14 AT [facility]  . Nonsustained ventricular tachycardia (H[facility])    PAST HISTORY  . Osteoporosis   . Peripheral neuropathy ([place])   . Posttraumatic stress disorder   . Restless leg syndrome   . Tubular adenoma 02/17/2008    Past Surgical History:  Procedure Laterality Date  . COLON SURGERY     for colon cancer march 2005  . COLONOSCOPY    . CYSTO N/A 06/03/2014   Procedure: CYSTO;  Surgeon: [name], MD;  [facility]: [facility];  Service: Urology;  Laterality: N/A;  . CYSTOSCOPY WITH  URETEROSCOPY Bilateral 07/26/2015   Procedure: CYSTO, BILATERAL RENAL WASHING WITH    URETEROSCOPY, TUR PROSTATE BIOPSY;  Surgeon: [name], MD;  [facility];  Service: Urology;  Laterality: Bilateral;  . EYE SURGERY     bilateral cataract surgery   . INNER EAR SURGERY     excision of tumor, then two surgeries: prosthesis, then had to remove.  '10  . JOINT REPLACEMENT    . KNEE ARTHROSCOPY    . PROSTATE BIOPSY N/A 07/26/2015   Procedure: PROSTATE BIOPSY;  Surgeon: [name], MD;  [facility];  Service: Urology;  Laterality: N/A;  . TOTAL KNEE ARTHROPLASTY     right  . TRANSURETHRAL RESECTION OF BLADDER TUMOR N/A 06/24/2014   Procedure: TRANSURETHRAL RESECTION OF BLADDER TUMOR (TURBT);  Surgeon: [name], MD;  [facility];  Service: Urology;  Laterality: N/A;  . TRANSURETHRAL RESECTION OF BLADDER TUMOR WITH GYRUS (TURBT-GYRUS) N/A 06/03/2014   Procedure: TRANSURETHRAL RESECTION OF BLADDER TUMOR WITH GYRUS (TURBT-GYRUS);  Surgeon: [name], MD;  [facility];  Service: Urology;  Laterality: N/A;  . TRANSURETHRAL RESECTION OF PROSTATE N/A 07/26/2015   Procedure: BLADDER TRANSURETHRAL RESECTION ;  Surgeon: [name], MD;  [facility];  Service: Urology;  Laterality: N/A;  There were no vitals filed for this visit.      Subjective Assessment - 09/06/16 2109    Subjective Brought the exercises he was given at the [place]. Reported he has not been doing them as he wanted to see what PT would recommend. Eager for his bathroom remodel to be completed so he can set up his home elliptical.    Patient Stated Goals limit/prevent falls; [facility]   Currently in Pain? No/denies   Pain Onset More than a month ago                         [facility] Adult PT Treatment/Exercise - 09/06/16 0001      Bed Mobility   Bed Mobility Supine to Sit;Sit to Supine   Supine to Sit 6: Modified independent (Device/Increase time)   Sit to  Supine 6: Modified independent (Device/Increase time)     Transfers   Transfers Sit to Stand;Stand to Sit   Sit to Stand 6: Modified independent (Device/Increase time);With upper extremity assist   Stand to Sit 6: Modified independent (Device/Increase time);With upper extremity assist     Ambulation/Gait   Ambulation/Gait Assistance 6: Modified independent (Device/Increase time)   Ambulation Distance (Feet) 300 Feet   Assistive device None   Gait Pattern Step-through pattern;Trunk flexed   Ambulation Surface Level;Indoor     Posture/Postural Control   Posture/Postural Control Postural limitations   Postural Limitations Forward head;Rounded Shoulders;Decreased lumbar lordosis   Posture Comments without cane pt walking much more upright with no appearance of Rt knee buckling     Exercises   Exercises Knee/Hip;Lumbar     Lumbar Exercises: Stretches   Single Knee to Chest Stretch 3 reps;30 seconds   Single Knee to Chest Stretch Limitations vc for maintaining bil LE flexion as bringing single knee to chest; vc for steady breathing   Double Knee to Chest Stretch 1 rep;20 seconds   Double Knee to Chest Stretch Limitations pt with incr effort to bring bil knees to chest, breatholding despite cues   Pelvic Tilt --  x 10 reps, x 5 seconds   Pelvic Tilt Limitations Patient with great dificulty posterior pelvic tilt despite variety of verbal andtactile cues provided.      Lumbar Exercises: Seated   Other Seated Lumbar Exercises posterior pelvic tilt with "slouch" followed by upright posture and anterior pelvic tilt x 10 with manual facilitation to assist movement and incr ROM     Knee/Hip Exercises: Aerobic   Elliptical L 1; x 90 sec with moderate fatigue; dyspnea 2/4                PT Education - 09/06/16 2122    Education provided Yes   Education Details HEP for low back pain/strengthening (issued by [facility] at time of PT referral). Importance of strengthening his core muscles prior  to initiating vigorous exercises at the gym. Need to "ease into" a new exercise program--not overdo activities at gym and cause incr back pain.   Person(s) Educated Patient   Methods Explanation;Demonstration;Tactile cues;Verbal cues;Handout   Comprehension Verbalized understanding;Verbal cues required;Tactile cues required;Need further instruction          PT Short Term Goals - 08/31/16 1526      PT SHORT TERM GOAL #1   Title Patient will verbalize understanding of fall prevention techniques (goal by 09-21-16)   Time 3   Period Weeks   Status New  PT SHORT TERM GOAL #2   Title Patient will ambulate 300 ft modified independent with least restrictive assistive device on indoor, level surfaces.    Time 3   Period Weeks   Status New     PT SHORT TERM GOAL #3   Title Patient will demonstrate independence with HEP for LE strengthening and balance.   Time 3   Period Weeks   Status New     PT SHORT TERM GOAL #4   Title Patient will demonstrate reduced fall risk as evidenced by improved TUG score to <13.5 seconds.   Time 3   Period Weeks   Status New     PT SHORT TERM GOAL #5   Title Patient will complete Berg Balance Assessment to further assess fall risk and areas of impairment. LTG to be set as appropriate.           PT Long Term Goals - 08/31/16 1535      PT LONG TERM GOAL #1   Title Patient will be independent with HEP and able to verbalize appropriate exercises/machines for him to use [facility]. (target date 10-12-16)   Time 6   Period Weeks   Status New     PT LONG TERM GOAL #2   Title Patient will demonstrate gait velocity >2.62 ft/sec (indicative of safe community ambulator).  (10-12-16)   Time 6   Period Weeks   Status New     PT LONG TERM GOAL #3   Title Patient will ambulate >500 ft modified independent with least restrictive device outdoors on unlevel, paved surfaces (i.e. sidewalk, parking lot).  (10-12-16)   Time 6   Period Weeks    Status New               Plan - 09/06/16 2126    Clinical Impression Statement Patient remains eager to improve his fitness level and "get back to the gym." He required increased time and multi-modal cuing for back exercises/stretches. Very limited lumbar ROM with pelvic anterior and posterior tilts with supine pelvic tilts ineffective and changed to seated pelvic tilts. Patient only tolerated 90 sec on the elliptical aerobic-conditioning machine with +dyspnea.    Rehab Potential Good   PT Frequency 2x / week  VA approved 12 visits over 8 weeks   PT Duration 6 weeks   PT Treatment/Interventions ADLs/Self Care Home Management;DME Instruction;Gait training;Functional mobility training;Therapeutic activities;Therapeutic exercise;Balance training;Neuromuscular re-education;Patient/family education   PT Next Visit Plan do Berg; fall prevention handout if time; Review seated pelvic tilts (slouch and then upright to aterior pelvic tilt; ?reattempt supine pelvic tilts. Elliptical for >90 seconds; Leg press;  initiate HEP for LE strengthening and balance; assess cane height and proper use (if he brings cane);    PT Home Exercise Plan sitting hamstring stretch, single knee to chest stretch (supine), "pelvic tilts" in sitting; supine clam with theraband   Consulted and Agree with Plan of Care Patient      Patient will benefit from skilled therapeutic intervention in order to improve the following deficits and impairments:  Abnormal gait, Decreased balance, Decreased knowledge of use of DME, Decreased strength, Difficulty walking, Impaired sensation  Visit Diagnosis: Other abnormalities of gait and mobility  Muscle weakness (generalized)  History of falling     Problem List Patient Active Problem List   Diagnosis Date Noted  . Gastroesophageal reflux disease without esophagitis 06/22/2016  . Constipation 06/22/2016  . Bladder cancer ([place]) 06/03/2014  . Iron  deficiency anemia,  unspecified 06/13/2012  . Sleep disorder 04/16/2012  . Nonsustained ventricular tachycardia ([place]) 09/17/2011  . Atypical chest pain 08/27/2011  . GOUT, ACUTE 07/16/2008  . RENAL CYST, RIGHT 07/16/2008  . TRANSAMINASES, SERUM, ELEVATED 07/16/2008  . DIABETES MELLITUS, TYPE II 04/12/2008  . PERIPHERAL EDEMA 04/12/2008  . POSTTRAUMATIC STRESS DISORDER 08/11/2007  . PERIPHERAL NEUROPATHY 08/11/2007  . HYPERTENSION 08/11/2007  . ALLERGIC RHINITIS 08/11/2007  . BENIGN PROSTATIC HYPERTROPHY 08/11/2007  . DEGENERATIVE JOINT DISEASE 08/11/2007  . OSTEOPOROSIS 08/11/2007  . COLON CANCER, HX OF 08/11/2007  . Other postprocedural status(V45.89) 08/11/2007    Rexanne Mano, PT 09/06/2016, 9:38 PM  [facility] [address] [place], [place], [address] Phone: [phone]   Fax:  [phone]  Name: BERL BAUMAN MRN: [id number] Date of Birth: 06-19-1933

## 2016-09-07 ENCOUNTER — Ambulatory Visit: Payer: Medicare Other | Admitting: Physical Therapy

## 2016-09-07 DIAGNOSIS — Z9181 History of falling: Secondary | ICD-10-CM | POA: Diagnosis not present

## 2016-09-07 DIAGNOSIS — M6281 Muscle weakness (generalized): Secondary | ICD-10-CM

## 2016-09-07 DIAGNOSIS — R2689 Other abnormalities of gait and mobility: Secondary | ICD-10-CM

## 2016-09-07 NOTE — Therapy (Signed)
[facility] [address] [place], [place], [address] Phone: [phone]   Fax:  [phone]  Physical Therapy Treatment  Patient Details  Name: [name] MRN: [id number] Date of Birth: 1933/07/03 Referring Provider: Dr. Marsh [name]  Encounter Date: 09/07/2016      PT End of Session - 09/07/16 1250    Visit Number 3   Number of Visits 12   Authorization Type VA--also has medicare and tricare; 12 visits over 8 weeks   Authorization Time Period 08/31/16 to 10/30/16   PT Start Time 0935   PT Stop Time 1015   PT Time Calculation (min) 40 min   Equipment Utilized During Treatment Gait belt   Activity Tolerance Patient tolerated treatment well   Behavior During Therapy [facility] for tasks assessed/performed      Past Medical History:  Diagnosis Date  . Allergic rhinitis   . Anemia   . Anxiety   . Bladder tumor    WAS HAVING BLOOD IN URINE - CLEARED BUT OCCAS CLOT NOW  . BPH (benign prostatic hypertrophy)    FREQUENT URINATION   . Cancer ([place])   . Cataract    bilateral extrraction and lens implants '88-,'89  . Depression   . Diverticulosis   . DJD (degenerative joint disease)   . DM2 (diabetes mellitus, type 2) ([place])   . Dyspepsia   . GERD (gastroesophageal reflux disease)   . Glaucoma   . Glaucoma   . Headache   . Hearing loss    bilateral  . History of colon cancer   . Hypertension   . Multiple pulmonary nodules    SEEN ON CT CHEST DONE [facility]/25/14 AT [facility]  . Nonsustained ventricular tachycardia ([facility])    PAST HISTORY  . Osteoporosis   . Peripheral neuropathy ([place])   . Posttraumatic stress disorder   . Restless leg syndrome   . Tubular adenoma 02/17/2008    Past Surgical History:  Procedure Laterality Date  . COLON SURGERY     for colon cancer march 2005  . COLONOSCOPY    . CYSTO N/A 06/03/2014   Procedure: CYSTO;  Surgeon: [name], MD;  [facility];  Service:  Urology;  Laterality: N/A;  . CYSTOSCOPY WITH URETEROSCOPY Bilateral 07/26/2015   Procedure: CYSTO, BILATERAL RENAL WASHING WITH    URETEROSCOPY, TUR PROSTATE BIOPSY;  Surgeon: [name], MD;  [facility];  Service: Urology;  Laterality: Bilateral;  . EYE SURGERY     bilateral cataract surgery   . INNER EAR SURGERY     excision of tumor, then two surgeries: prosthesis, then had to remove.  '10  . JOINT REPLACEMENT    . KNEE ARTHROSCOPY    . PROSTATE BIOPSY N/A 07/26/2015   Procedure: PROSTATE BIOPSY;  Surgeon: [name], MD;  [facility];  Service: Urology;  Laterality: N/A;  . TOTAL KNEE ARTHROPLASTY     right  . TRANSURETHRAL RESECTION OF BLADDER TUMOR N/A 06/24/2014   Procedure: TRANSURETHRAL RESECTION OF BLADDER TUMOR (TURBT);  Surgeon: [name], MD;  [facility];  Service: Urology;  Laterality: N/A;  . TRANSURETHRAL RESECTION OF BLADDER TUMOR WITH GYRUS (TURBT-GYRUS) N/A 06/03/2014   Procedure: TRANSURETHRAL RESECTION OF BLADDER TUMOR WITH GYRUS (TURBT-GYRUS);  Surgeon: [name], MD;  [facility];  Service: Urology;  Laterality: N/A;  . TRANSURETHRAL RESECTION OF PROSTATE N/A 07/26/2015   Procedure: BLADDER TRANSURETHRAL RESECTION ;  Surgeon: [name], MD;  [facility];  Service: Urology;  Laterality: N/A;    There were no vitals filed for this visit.      Subjective Assessment - 09/07/16 0939    Subjective Reports soreness in knees and thighs from yesterday PT. Took my advice and deferred doing elliptical machine this morning before coming to PT so he does not overdo it.    Patient Stated Goals limit/prevent falls; [facility]   Currently in Pain? Yes   Pain Score 7    Pain Location Knee   Pain Orientation Right   Pain Descriptors / Indicators Aching   Pain Type Chronic pain   Pain Onset More than a month ago   Pain Frequency Constant                         [facility] Adult PT Treatment/Exercise -  09/07/16 0947      Bed Mobility   Bed Mobility Supine to Sit;Sit to Supine   Supine to Sit 6: Modified independent (Device/Increase time)   Sit to Supine 6: Modified independent (Device/Increase time)     Transfers   Transfers Sit to Stand;Stand to Sit   Sit to Stand 6: Modified independent (Device/Increase time);With upper extremity assist   Stand to Sit 6: Modified independent (Device/Increase time);With upper extremity assist     Balance   Balance Assessed Yes     Standardized Balance Assessment   Standardized Balance Assessment Berg Balance Test     Berg Balance Test   Sit to Stand Able to stand without using hands and stabilize independently   Standing Unsupported Able to stand safely 2 minutes   Sitting with Back Unsupported but Feet Supported on Floor or Stool Able to sit safely and securely 2 minutes   Stand to Sit Sits safely with minimal use of hands   Transfers Able to transfer safely, minor use of hands   Standing Unsupported with Eyes Closed Able to stand 10 seconds safely   Standing Ubsupported with Feet Together Able to place feet together independently and stand 1 minute safely   From Standing, Reach Forward with Outstretched Arm Can reach confidently >25 cm (10")   From Standing Position, Pick up Object from Floor Able to pick up shoe safely and easily   From Standing Position, Turn to Look Behind Over each Shoulder Looks behind one side only/other side shows less weight shift   Turn 360 Degrees Able to turn 360 degrees safely in 4 seconds or less   Standing Unsupported, Alternately Place Feet on Step/Stool Able to stand independently and safely and complete 8 steps in 20 seconds   Standing Unsupported, One Foot in Front Able to plae foot ahead of the other independently and hold 30 seconds   Standing on One Leg Able to lift leg independently and hold > 10 seconds   Total Score 54     Lumbar Exercises: Stretches   Active Hamstring Stretch 2 reps;30 seconds  bil;  seated   Single Knee to Chest Stretch 1 rep;30 seconds     Lumbar Exercises: Seated   Other Seated Lumbar Exercises posterior pelvic tilt with "slouch" followed by upright posture and anterior pelvic tilt x 5 with manual facilitation to assist movement and incr ROM     Lumbar Exercises: Supine   Clam 10 reps;2 seconds  red band     Knee/Hip Exercises: Aerobic   Nustep seat 15, arms 10 L6 x 3  minutes                PT Education - 09/07/16 1247    Education provided Yes   Education Details Answered patient's questions re: HEP (he requested to review each exercise to assure he was doing correctly). Pt required min cues to perform correct technique, however with excellent carryover for the following reps.    Person(s) Educated Patient   Methods Explanation;Demonstration;Handout   Comprehension Verbalized understanding;Returned demonstration          PT Short Term Goals - 09/07/16 1259      PT SHORT TERM GOAL #1   Title Patient will verbalize understanding of fall prevention techniques (goal by 09-21-16)     PT SHORT TERM GOAL #5   Title Patient will complete Berg Balance Assessment to further assess fall risk and areas of impairment. LTG to be set as appropriate. met 09/07/16 54/56 no LTG set   Status Achieved           PT Long Term Goals - 08/31/16 1535      PT LONG TERM GOAL #1   Title Patient will be independent with HEP and able to verbalize appropriate exercises/machines for him to use [facility]. (target date 10-12-16)   Time 6   Period Weeks   Status New     PT LONG TERM GOAL #2   Title Patient will demonstrate gait velocity >2.62 ft/sec (indicative of safe community ambulator).  (10-12-16)   Time 6   Period Weeks   Status New     PT LONG TERM GOAL #3   Title Patient will ambulate >500 ft modified independent with least restrictive device outdoors on unlevel, paved surfaces (i.e. sidewalk, parking lot).  (10-12-16)   Time 6   Period  Weeks   Status New               Plan - 09/07/16 1253    Clinical Impression Statement Patient with appropriate soreness in thigh muscles after PT yesterday. He reports Rt knee pain (worse than back pain) is unchanged. Patient is highly motivated and required minimal cues for proper technique to perform HEP given to him 09/06/16. Due to history of falls, performed Berg Balance assessment with pt scoring 54/56 (supporting his statement that when he is careful, he can control his rt knee; when he has moved fast, his knee has buckled causing him to fall). Will not incorporate balance goals into LTG's, although his balance will continue to be challenged by some LE exercises in pt's plan.    Rehab Potential Good   PT Frequency 2x / week  VA approved 12 visits over 8 weeks   PT Duration 6 weeks   PT Treatment/Interventions ADLs/Self Care Home Management;DME Instruction;Gait training;Functional mobility training;Therapeutic activities;Therapeutic exercise;Balance training;Neuromuscular re-education;Patient/family education   PT Next Visit Plan  Review seated pelvic tilts (slouch and then upright to aterior pelvic tilt; ?reattempt supine pelvic tilts. Elliptical for >90 seconds; Leg press-terminal ext to decr knee pain;  initiate HEP for LE strengthening; if he brings in his cane, assess cane height and proper use    PT Home Exercise Plan sitting hamstring stretch, single knee to chest stretch (supine), "pelvic tilts" in sitting; supine clam with theraband   Consulted and Agree with Plan of Care Patient      Patient will benefit from skilled therapeutic intervention in order to improve the following deficits and impairments:  Abnormal gait, Decreased balance, Decreased knowledge of  use of DME, Decreased strength, Difficulty walking, Impaired sensation  Visit Diagnosis: Other abnormalities of gait and mobility  Muscle weakness (generalized)  History of falling     Problem List Patient  Active Problem List   Diagnosis Date Noted  . Gastroesophageal reflux disease without esophagitis 06/22/2016  . Constipation 06/22/2016  . Bladder cancer ([place]) 06/03/2014  . Iron deficiency anemia, unspecified 06/13/2012  . Sleep disorder 04/16/2012  . Nonsustained ventricular tachycardia ([place]) 09/17/2011  . Atypical chest pain 08/27/2011  . GOUT, ACUTE 07/16/2008  . RENAL CYST, RIGHT 07/16/2008  . TRANSAMINASES, SERUM, ELEVATED 07/16/2008  . DIABETES MELLITUS, TYPE II 04/12/2008  . PERIPHERAL EDEMA 04/12/2008  . POSTTRAUMATIC STRESS DISORDER 08/11/2007  . PERIPHERAL NEUROPATHY 08/11/2007  . HYPERTENSION 08/11/2007  . ALLERGIC RHINITIS 08/11/2007  . BENIGN PROSTATIC HYPERTROPHY 08/11/2007  . DEGENERATIVE JOINT DISEASE 08/11/2007  . OSTEOPOROSIS 08/11/2007  . COLON CANCER, HX OF 08/11/2007  . Other postprocedural status(V45.89) 08/11/2007    Rexanne Mano, PT 09/07/2016, 1:02 PM  [facility] [address] [place], [place], [address] Phone: [phone]   Fax:  [phone]  Name: [name] MRN: [id number] Date of Birth: 1933-05-27

## 2016-09-10 ENCOUNTER — Ambulatory Visit: Payer: Medicare Other | Admitting: Physical Therapy

## 2016-09-11 ENCOUNTER — Ambulatory Visit: Payer: Medicare Other | Admitting: Physical Therapy

## 2016-09-11 DIAGNOSIS — R2689 Other abnormalities of gait and mobility: Secondary | ICD-10-CM | POA: Diagnosis not present

## 2016-09-11 DIAGNOSIS — M6281 Muscle weakness (generalized): Secondary | ICD-10-CM

## 2016-09-11 DIAGNOSIS — Z9181 History of falling: Secondary | ICD-10-CM | POA: Diagnosis not present

## 2016-09-11 NOTE — Patient Instructions (Signed)
ABDUCTION: Standing - Resistance Band (Active)    Seated by counter, place both feet inside resistance band and bring up to ankles. Stand, feet flat at counter. Slowly lift right leg out to side and slowly return. Repeat with left leg. Complete _2__ sets of _20 __ repetitions (10 reps each leg). Perform _1__ sessions per day.  http://gtsc.exer.us/116   Copyright  VHI. All rights reserved.       Hip Extension With Band    Seated by counter, place both feet inside resistance band and bring up to ankles. Stand, feet flat at counter. Lift right leg back while keeping shoulders/upper body upright. Return. Repeat with left leg.  Repeat __20__ times. Do 2 sets. Do __1__ sessions per day.  http://cc.exer.us/19   Copyright  VHI. All rights reserved.     Balance, Proprioception: Hip Flexion With Tubing    Seated by counter, place both feet inside resistance band and bring up to ankles. Stand, feet flat at counter. Lift right leg forward while keeping knee straight. Return. Repeat with left leg.  Repeat __20__ times, do 2 sets. Do __1__ sessions per day.  http://cc.exer.us/18   Copyright  VHI. All rights reserved.

## 2016-09-11 NOTE — Therapy (Signed)
[facility] [address] [place], [place], [address] Phone: [phone]   Fax:  [phone]  Physical Therapy Treatment  Patient Details  Name: [name] MRN: [id number] Date of Birth: 09/12/1932 Referring Provider: Dr. Marsh [name]  Encounter Date: 09/11/2016      PT End of Session - 09/11/16 1329    Visit Number 4   Number of Visits 12   Authorization Type VA--also has medicare and tricare; 12 visits over 8 weeks   Authorization Time Period 08/31/16 to 10/30/16   PT Start Time 1016   PT Stop Time 1102   PT Time Calculation (min) 46 min   Equipment Utilized During Treatment Gait belt   Activity Tolerance Patient tolerated treatment well   Behavior During Therapy [facility] for tasks assessed/performed      Past Medical History:  Diagnosis Date  . Allergic rhinitis   . Anemia   . Anxiety   . Bladder tumor    WAS HAVING BLOOD IN URINE - CLEARED BUT OCCAS CLOT NOW  . BPH (benign prostatic hypertrophy)    FREQUENT URINATION   . Cancer ([place])   . Cataract    bilateral extrraction and lens implants '88-,'89  . Depression   . Diverticulosis   . DJD (degenerative joint disease)   . DM2 (diabetes mellitus, type 2) ([place])   . Dyspepsia   . GERD (gastroesophageal reflux disease)   . Glaucoma   . Glaucoma   . Headache   . Hearing loss    bilateral  . History of colon cancer   . Hypertension   . Multiple pulmonary nodules    SEEN ON CT CHEST DONE 05/25/14 AT [facility]  . Nonsustained ventricular tachycardia (H[facility])    PAST HISTORY  . Osteoporosis   . Peripheral neuropathy ([place])   . Posttraumatic stress disorder   . Restless leg syndrome   . Tubular adenoma 02/17/2008    Past Surgical History:  Procedure Laterality Date  . COLON SURGERY     for colon cancer march 2005  . COLONOSCOPY    . CYSTO N/A 06/03/2014   Procedure: CYSTO;  Surgeon: [name], MD;  Location: [facility];  Service:  Urology;  Laterality: N/A;  . CYSTOSCOPY WITH URETEROSCOPY Bilateral 07/26/2015   Procedure: CYSTO, BILATERAL RENAL WASHING WITH    URETEROSCOPY, TUR PROSTATE BIOPSY;  Surgeon: [name], MD;  [facility]: [facility];  Service: Urology;  Laterality: Bilateral;  . EYE SURGERY     bilateral cataract surgery   . INNER EAR SURGERY     excision of tumor, then two surgeries: prosthesis, then had to remove.  '10  . JOINT REPLACEMENT    . KNEE ARTHROSCOPY    . PROSTATE BIOPSY N/A 07/26/2015   Procedure: PROSTATE BIOPSY;  Surgeon: [name], MD;  [facility];  Service: Urology;  Laterality: N/A;  . TOTAL KNEE ARTHROPLASTY     right  . TRANSURETHRAL RESECTION OF BLADDER TUMOR N/A 06/24/2014   Procedure: TRANSURETHRAL RESECTION OF BLADDER TUMOR (TURBT);  Surgeon: [name], MD;  [facility];  Service: Urology;  Laterality: N/A;  . TRANSURETHRAL RESECTION OF BLADDER TUMOR WITH GYRUS (TURBT-GYRUS) N/A 06/03/2014   Procedure: TRANSURETHRAL RESECTION OF BLADDER TUMOR WITH GYRUS (TURBT-GYRUS);  Surgeon: [name], MD;  [facility];  Service: Urology;  Laterality: N/A;  . TRANSURETHRAL RESECTION OF PROSTATE N/A 07/26/2015   Procedure: BLADDER TRANSURETHRAL RESECTION ;  Surgeon: [name], MD;  [facility];  Service: Urology;  Laterality: N/A;    There were no vitals filed for this visit.      Subjective Assessment - 09/11/16 1025    Subjective (P)  Hasn't done any exercise since last visit. Has been going to [place] and [place] for appts (for him and his brother). Home construction is still underway, so cannot get to his elliptical.    Patient Stated Goals (P)  limit/prevent falls; [facility]   Pain Score (P)  7    Pain Location (P)  Knee   Pain Orientation (P)  Right   Pain Onset (P)  More than a month ago                         [facility] Adult PT Treatment/Exercise - 09/11/16 0001      Ambulation/Gait   Ambulation/Gait  Assistance 6: Modified independent (Device/Increase time)   Ambulation/Gait Assistance Details vc for using cane on his left for right knee pain   Ambulation Distance (Feet) 110 Feet   Assistive device Straight cane   Gait Pattern Step-to pattern;Decreased stride length;Antalgic;Trunk flexed   Ambulation Surface Level;Indoor     Lumbar Exercises: Seated   Other Seated Lumbar Exercises posterior pelvic tilt with "slouch" followed by upright posture and anterior pelvic tilt x 5 with manual facilitation to assist movement and incr ROM; noted incr ROM      Knee/Hip Exercises: Aerobic   Elliptical L1 x 2 min (1 min fwd, 1 min backward), moderate fatigue     Knee/Hip Exercises: Machines for Strengthening   Total Gym Leg Press 70# 15 reps x 2; endrange knee extension without recurvatum     Knee/Hip Exercises: Standing   Hip Flexion Stengthening;Both;2 sets;10 reps;Knee straight  red band   Hip Abduction Stengthening;Both;2 sets;10 reps;Knee straight  red band   Hip Extension Stengthening;Both;2 sets;10 reps;Knee straight  red band                PT Education - 09/11/16 1328    Education provided Yes   Education Details Adding hip exercises in standing with theraband to HEP   Person(s) Educated Patient   Methods Explanation;Demonstration   Comprehension Verbalized understanding;Returned demonstration          PT Short Term Goals - 09/07/16 1259      PT SHORT TERM GOAL #1   Title Patient will verbalize understanding of fall prevention techniques (goal by 09-21-16)     PT SHORT TERM GOAL #5   Title Patient will complete Berg Balance Assessment to further assess fall risk and areas of impairment. LTG to be set as appropriate. met 09/07/16 54/56 no LTG set   Status Achieved           PT Long Term Goals - 08/31/16 1535      PT LONG TERM GOAL #1   Title Patient will be independent with HEP and able to verbalize appropriate exercises/machines for him to use [facility]. (target date 10-12-16)   Time 6   Period Weeks   Status New     PT LONG TERM GOAL #2   Title Patient will demonstrate gait velocity >2.62 ft/sec (indicative of safe community ambulator).  (10-12-16)   Time 6   Period Weeks   Status New     PT LONG TERM GOAL #3   Title Patient will ambulate >500 ft modified independent  with least restrictive device outdoors on unlevel, paved surfaces (i.e. sidewalk, parking lot).  (10-12-16)   Time [place] - 09/11/16 1329    Clinical Impression Statement Patient has not progressed with his HEP due to recent travel to [place] and [place] for MD/VA appointments for himself and his brother. Educated on gradual progression of exercises in effort to not cause increased back or knee pain. Patient reports he will do better with HEP this week. Skilled interventions focused on establishing strrengthening exercises for lower extremities for HEP.    Rehab Potential Good   PT Frequency 2x / week  VA approved 12 visits over 8 weeks   PT Duration 6 weeks   PT Treatment/Interventions ADLs/Self Care Home Management;DME Instruction;Gait training;Functional mobility training;Therapeutic activities;Therapeutic exercise;Balance training;Neuromuscular re-education;Patient/family education   PT Next Visit Plan  Review seated pelvic tilts (slouch and then upright to aterior pelvic tilt; ?reattempt supine pelvic tilts. Elliptical for >90 seconds; Leg press-terminal ext to decr knee pain;  initiate HEP for LE strengthening; if he brings in his cane, assess cane height and proper use    PT Home Exercise Plan sitting hamstring stretch, single knee to chest stretch (supine), "pelvic tilts" in sitting; supine clam with theraband   Consulted and Agree with Plan of Care Patient      Patient will benefit from skilled therapeutic intervention in order to improve the following deficits and impairments:  Abnormal gait,  Decreased balance, Decreased knowledge of use of DME, Decreased strength, Difficulty walking, Impaired sensation  Visit Diagnosis: Muscle weakness (generalized)  History of falling  Other abnormalities of gait and mobility     Problem List Patient Active Problem List   Diagnosis Date Noted  . Gastroesophageal reflux disease without esophagitis 06/22/2016  . Constipation 06/22/2016  . Bladder cancer ([place]) 06/03/2014  . Iron deficiency anemia, unspecified 06/13/2012  . Sleep disorder 04/16/2012  . Nonsustained ventricular tachycardia ([place]) 09/17/2011  . Atypical chest pain 08/27/2011  . GOUT, ACUTE 07/16/2008  . RENAL CYST, RIGHT 07/16/2008  . TRANSAMINASES, SERUM, ELEVATED 07/16/2008  . DIABETES MELLITUS, TYPE II 04/12/2008  . PERIPHERAL EDEMA 04/12/2008  . POSTTRAUMATIC STRESS DISORDER 08/11/2007  . PERIPHERAL NEUROPATHY 08/11/2007  . HYPERTENSION 08/11/2007  . ALLERGIC RHINITIS 08/11/2007  . BENIGN PROSTATIC HYPERTROPHY 08/11/2007  . DEGENERATIVE JOINT DISEASE 08/11/2007  . OSTEOPOROSIS 08/11/2007  . COLON CANCER, HX OF 08/11/2007  . Other postprocedural status(V45.89) 08/11/2007    Rexanne Mano, PT 09/11/2016, 1:53 PM  [facility] [address] [place], [place], [address] Phone: [phone]   Fax:  [phone]  Name: [name] MRN: [id number] Date of Birth: 02-19-1933

## 2016-09-17 ENCOUNTER — Ambulatory Visit: Payer: Medicare Other | Attending: Internal Medicine | Admitting: Physical Therapy

## 2016-09-17 DIAGNOSIS — Z9181 History of falling: Secondary | ICD-10-CM | POA: Diagnosis not present

## 2016-09-17 DIAGNOSIS — M6281 Muscle weakness (generalized): Secondary | ICD-10-CM | POA: Diagnosis not present

## 2016-09-17 DIAGNOSIS — R2689 Other abnormalities of gait and mobility: Secondary | ICD-10-CM | POA: Insufficient documentation

## 2016-09-17 NOTE — Therapy (Signed)
[facility] [address] [place], [place], [address] Phone: [phone]   Fax:  [phone]  Physical Therapy Treatment  Patient Details  Name: [name] MRN: [id number] Date of Birth: [birth year] Referring Provider: Dr. Marsh [name]  Encounter Date: 09/17/2016      PT End of Session - 09/17/16 1811    Visit Number 5   Number of Visits 12   Authorization Type VA--also has medicare and tricare; 12 visits over 8 weeks   Authorization - Visit Number 5   Authorization - Number of Visits 12   PT Start Time 8127   PT Stop Time 1401   PT Time Calculation (min) 44 min      Past Medical History:  Diagnosis Date  . Allergic rhinitis   . Anemia   . Anxiety   . Bladder tumor    WAS HAVING BLOOD IN URINE - CLEARED BUT OCCAS CLOT NOW  . BPH (benign prostatic hypertrophy)    FREQUENT URINATION   . Cancer ([place])   . Cataract    bilateral extrraction and lens implants '88-,'89  . Depression   . Diverticulosis   . DJD (degenerative joint disease)   . DM2 (diabetes mellitus, type 2) ([place])   . Dyspepsia   . GERD (gastroesophageal reflux disease)   . Glaucoma   . Glaucoma   . Headache   . Hearing loss    bilateral  . History of colon cancer   . Hypertension   . Multiple pulmonary nodules    SEEN ON CT CHEST DONE 05/25/14 AT [facility]  . Nonsustained ventricular tachycardia ([facility])    PAST HISTORY  . Osteoporosis   . Peripheral neuropathy ([place])   . Posttraumatic stress disorder   . Restless leg syndrome   . Tubular adenoma 02/17/2008    Past Surgical History:  Procedure Laterality Date  . COLON SURGERY     for colon cancer march 2005  . COLONOSCOPY    . CYSTO N/A 06/03/2014   Procedure: CYSTO;  Surgeon: [name], MD;  [facility];  Service: Urology;  Laterality: N/A;  . CYSTOSCOPY WITH URETEROSCOPY Bilateral 07/26/2015   Procedure: CYSTO, BILATERAL RENAL WASHING WITH    URETEROSCOPY,  TUR PROSTATE BIOPSY;  Surgeon: [name], MD;  [facility];  Service: Urology;  Laterality: Bilateral;  . EYE SURGERY     bilateral cataract surgery   . INNER EAR SURGERY     excision of tumor, then two surgeries: prosthesis, then had to remove.  '10  . JOINT REPLACEMENT    . KNEE ARTHROSCOPY    . PROSTATE BIOPSY N/A 07/26/2015   Procedure: PROSTATE BIOPSY;  Surgeon: [name], MD;  [facility];  Service: Urology;  Laterality: N/A;  . TOTAL KNEE ARTHROPLASTY     right  . TRANSURETHRAL RESECTION OF BLADDER TUMOR N/A 06/24/2014   Procedure: TRANSURETHRAL RESECTION OF BLADDER TUMOR (TURBT);  Surgeon: [name], MD;  [facility];  Service: Urology;  Laterality: N/A;  . TRANSURETHRAL RESECTION OF BLADDER TUMOR WITH GYRUS (TURBT-GYRUS) N/A 06/03/2014   Procedure: TRANSURETHRAL RESECTION OF BLADDER TUMOR WITH GYRUS (TURBT-GYRUS);  Surgeon: [name], MD;  [facility];  Service: Urology;  Laterality: N/A;  . TRANSURETHRAL RESECTION OF PROSTATE N/A 07/26/2015   Procedure: BLADDER TRANSURETHRAL RESECTION ;  Surgeon: [name], MD;  [facility];  Service: Urology;  Laterality: N/A;    There were no vitals filed for this  visit.      Subjective Assessment - 09/17/16 1756    Subjective Pt reports he is not as tight as he was - was doing stretches during the [employer] [place] last night   Patient Stated Goals limit/prevent falls; [facility]                         [facility] Adult PT Treatment/Exercise - 09/17/16 1329      Transfers   Transfers Sit to Stand   Sit to Stand 5: Supervision   Number of Reps Other reps (comment)  5 reps   Comments no UE support used -  performed from mat     Ambulation/Gait   Ambulation/Gait Yes   Ambulation/Gait Assistance Details height of cane assessed for correct height - was set correctly; pt using cane in Lt hand due to c/o occasional Rt knee instability   Ambulation Distance (Feet) 100 Feet    Assistive device Straight cane   Gait Pattern Step-to pattern;Decreased stride length;Antalgic;Trunk flexed   Ambulation Surface Level;Indoor     Lumbar Exercises: Stretches   Active Hamstring Stretch 1 rep;30 seconds  bil. LE's   Pelvic Tilt Other (comment)  10 reps - seated on mat     Knee/Hip Exercises: Aerobic   Elliptical Level 1.6 x 1" forward, 45 secs backward due to fatigue     Knee/Hip Exercises: Machines for Strengthening   Cybex Leg Press 70# 15 reps bil. LE's with cues for eccentric control     Knee/Hip Exercises: Standing   Heel Raises Both;1 set;10 reps   Hip Flexion Stengthening;Both;1 set;10 reps  with green theraband in standing   Hip Abduction Stengthening;Both;1 set;10 reps  with green theraband in standing   Hip Extension Stengthening;Both;1 set;10 reps  with green theraband in standing   Forward Step Up Both;1 set;10 reps;Hand Hold: 2;Step Height: 4"   Step Down Both;1 set;10 reps;Step Height: 4";Hand Hold: 2   Other Standing Knee Exercises Pt performed bil. toes raises in standing x 10 reps     Knee/Hip Exercises: Seated   Long Arc Quad Both;1 set;10 reps;Strengthening  with green theraband                   PT Short Term Goals - 09/07/16 1259      PT SHORT TERM GOAL #1   Title Patient will verbalize understanding of fall prevention techniques (goal by 09-21-16)     PT SHORT TERM GOAL #5   Title Patient will complete Berg Balance Assessment to further assess fall risk and areas of impairment. LTG to be set as appropriate. met 09/07/16 54/56 no LTG set   Status Achieved           PT Long Term Goals - 08/31/16 1535      PT LONG TERM GOAL #1   Title Patient will be independent with HEP and able to verbalize appropriate exercises/machines for him to use [facility]. (target date 10-12-16)   Time 6   Period Weeks   Status New     PT LONG TERM GOAL #2   Title Patient will demonstrate gait velocity >2.62 ft/sec  (indicative of safe community ambulator).  (10-12-16)   Time 6   Period Weeks   Status New     PT LONG TERM GOAL #3   Title Patient will ambulate >500 ft modified independent with least restrictive device outdoors  on unlevel, paved surfaces (i.e. sidewalk, parking lot).  (10-12-16)   Time 6   Period Weeks   Status New               Plan - 09/17/16 1812    Clinical Impression Statement Pt fatigued with resisted hip exercises in standing with use of theraband - required frequent short rest breaks:  pt performed elliptical exercise at end of session; was unable to increase time on elliptical due to c/o fatigue in lower extremities    Rehab Potential Good   PT Frequency 2x / week   PT Duration 6 weeks   PT Treatment/Interventions ADLs/Self Care Home Management;DME Instruction;Gait training;Functional mobility training;Therapeutic activities;Therapeutic exercise;Balance training;Neuromuscular re-education;Patient/family education   PT Next Visit Plan add to HEP for LE strengthening: (pt brought in HEP from previous PT in [place] in 2017)   PT Home Exercise Plan sitting hamstring stretch, single knee to chest stretch (supine), "pelvic tilts" in sitting; supine clam with theraband   Consulted and Agree with Plan of Care Patient      Patient will benefit from skilled therapeutic intervention in order to improve the following deficits and impairments:  Abnormal gait, Decreased balance, Decreased knowledge of use of DME, Decreased strength, Difficulty walking, Impaired sensation  Visit Diagnosis: Other abnormalities of gait and mobility  Muscle weakness (generalized)     Problem List Patient Active Problem List   Diagnosis Date Noted  . Gastroesophageal reflux disease without esophagitis 06/22/2016  . Constipation 06/22/2016  . Bladder cancer ([place]) 06/03/2014  . Iron deficiency anemia, unspecified 06/13/2012  . Sleep disorder 04/16/2012  . Nonsustained ventricular tachycardia  ([place]) 09/17/2011  . Atypical chest pain 08/27/2011  . GOUT, ACUTE 07/16/2008  . RENAL CYST, RIGHT 07/16/2008  . TRANSAMINASES, SERUM, ELEVATED 07/16/2008  . DIABETES MELLITUS, TYPE II 04/12/2008  . PERIPHERAL EDEMA 04/12/2008  . POSTTRAUMATIC STRESS DISORDER 08/11/2007  . PERIPHERAL NEUROPATHY 08/11/2007  . HYPERTENSION 08/11/2007  . ALLERGIC RHINITIS 08/11/2007  . BENIGN PROSTATIC HYPERTROPHY 08/11/2007  . DEGENERATIVE JOINT DISEASE 08/11/2007  . OSTEOPOROSIS 08/11/2007  . COLON CANCER, HX OF 08/11/2007  . Other postprocedural status(V45.89) 08/11/2007    Eldridge Marcott, [place], PT 09/17/2016, 6:26 PM  [facility] [address] [place], [place], [address] Phone: [phone]   Fax:  [phone]  Name: [name] MRN: [id number] Date of Birth: 01/16/1933

## 2016-09-20 ENCOUNTER — Ambulatory Visit: Payer: Medicare Other | Admitting: Physical Therapy

## 2016-09-20 DIAGNOSIS — R2689 Other abnormalities of gait and mobility: Secondary | ICD-10-CM

## 2016-09-20 DIAGNOSIS — Z9181 History of falling: Secondary | ICD-10-CM | POA: Diagnosis not present

## 2016-09-20 DIAGNOSIS — M6281 Muscle weakness (generalized): Secondary | ICD-10-CM

## 2016-09-20 NOTE — Therapy (Signed)
[facility] [address] [place], [place], [address] Phone: [phone]   Fax:  [phone]  Physical Therapy Treatment  Patient Details  Name: [name] MRN: [id number] Date of Birth: 12-Dec-1932 Referring Provider: Dr. Marsh [name]  Encounter Date: 09/20/2016      PT End of Session - 09/20/16 1028    Visit Number 6   Number of Visits 12   Authorization Type VA--also has medicare and tricare; 12 visits over 8 weeks   Authorization Time Period 08/31/16 to 10/30/16   Authorization - Visit Number 6   Authorization - Number of Visits 12   PT Start Time 0930   PT Stop Time 1014   PT Time Calculation (min) 44 min   Activity Tolerance Patient tolerated treatment well   Behavior During Therapy [facility] for tasks assessed/performed      Past Medical History:  Diagnosis Date  . Allergic rhinitis   . Anemia   . Anxiety   . Bladder tumor    WAS HAVING BLOOD IN URINE - CLEARED BUT OCCAS CLOT NOW  . BPH (benign prostatic hypertrophy)    FREQUENT URINATION   . Cancer ([place])   . Cataract    bilateral extrraction and lens implants '88-,'89  . Depression   . Diverticulosis   . DJD (degenerative joint disease)   . DM2 (diabetes mellitus, type 2) ([place])   . Dyspepsia   . GERD (gastroesophageal reflux disease)   . Glaucoma   . Glaucoma   . Headache   . Hearing loss    bilateral  . History of colon cancer   . Hypertension   . Multiple pulmonary nodules    SEEN ON CT CHEST DONE 05/25/14 AT [facility]  . Nonsustained ventricular tachycardia (H[facility])    PAST HISTORY  . Osteoporosis   . Peripheral neuropathy ([place])   . Posttraumatic stress disorder   . Restless leg syndrome   . Tubular adenoma 02/17/2008    Past Surgical History:  Procedure Laterality Date  . COLON SURGERY     for colon cancer march 2005  . COLONOSCOPY    . CYSTO N/A 06/03/2014   Procedure: CYSTO;  Surgeon: [name], MD;  Location: [facility];  Service: Urology;  Laterality: N/A;  . CYSTOSCOPY WITH URETEROSCOPY Bilateral 07/26/2015   Procedure: CYSTO, BILATERAL RENAL WASHING WITH    URETEROSCOPY, TUR PROSTATE BIOPSY;  Surgeon: [name], MD;  [facility];  Service: Urology;  Laterality: Bilateral;  . EYE SURGERY     bilateral cataract surgery   . INNER EAR SURGERY     excision of tumor, then two surgeries: prosthesis, then had to remove.  '10  . JOINT REPLACEMENT    . KNEE ARTHROSCOPY    . PROSTATE BIOPSY N/A 07/26/2015   Procedure: PROSTATE BIOPSY;  Surgeon: [name], MD;  [facility];  Service: Urology;  Laterality: N/A;  . TOTAL KNEE ARTHROPLASTY     right  . TRANSURETHRAL RESECTION OF BLADDER TUMOR N/A 06/24/2014   Procedure: TRANSURETHRAL RESECTION OF BLADDER TUMOR (TURBT);  Surgeon: [name], MD;  [facility];  Service: Urology;  Laterality: N/A;  . TRANSURETHRAL RESECTION OF BLADDER TUMOR WITH GYRUS (TURBT-GYRUS) N/A 06/03/2014   Procedure: TRANSURETHRAL RESECTION OF BLADDER TUMOR WITH GYRUS (TURBT-GYRUS);  Surgeon: [name], MD;  [facility];  Service: Urology;  Laterality: N/A;  . TRANSURETHRAL RESECTION OF PROSTATE N/A 07/26/2015   Procedure: BLADDER TRANSURETHRAL RESECTION ;  Surgeon: [name], MD;  [facility];  Service: Urology;  Laterality: N/A;    There were no vitals filed for this visit.      Subjective Assessment - 09/20/16 0938    Subjective Reports he was sore after last session.    Patient Stated Goals limit/prevent falls; [facility]   Currently in Pain? Yes   Pain Score 7    Pain Location Knee   Pain Orientation Right   Pain Descriptors / Indicators Aching;Sore   Pain Type Chronic pain   Pain Onset More than a month ago   Pain Frequency Constant                     [facility] Adult PT Treatment/Exercise - 09/20/16 0001      Transfers   Transfers Sit to Stand;Stand to Sit   Sit to Stand 6: Modified independent  (Device/Increase time)     Ambulation/Gait   Ambulation/Gait Assistance 6: Modified independent (Device/Increase time)   Ambulation Distance (Feet) 150 Feet  x 2   Assistive device Straight cane   Gait Pattern Step-through pattern;Decreased stance time - right;Trunk flexed   Ambulation Surface Level;Indoor     Lumbar Exercises: Seated   Other Seated Lumbar Exercises posterior pelvic tilt with "slouch" followed by upright posture and anterior pelvic tilt x 5 with manual facilitation to assist movement and incr ROM; noted incr ROM      Knee/Hip Exercises: Aerobic   Other Aerobic Sci-Fit x 5 min; seat all the way back; level 1     Knee/Hip Exercises: Supine   Bridges with Clamshell Strengthening;Both;10 reps  green band   Other Supine Knee/Hip Exercises bridging with marching 5 reps; 2 sets     Knee/Hip Exercises: Sidelying   Hip ABduction Strengthening;Both;15 reps  green band              PT Education - 09/20/16 1026    Education provided Yes   Education Details added hip abduction in sidelying with green band (less strain on his back); added bridging and bridging with marching   Person(s) Educated Patient   Methods Explanation;Demonstration;Verbal cues;Handout   Comprehension Verbalized understanding;Returned demonstration;Need further instruction          PT Short Term Goals - 09/20/16 1033      PT SHORT TERM GOAL #1   Title Patient will verbalize understanding of fall prevention techniques (goal by 09-21-16)   Time 3   Period Weeks   Status Achieved     PT SHORT TERM GOAL #2   Title Patient will ambulate 300 ft modified independent with least restrictive assistive device on indoor, level surfaces.    Time 3   Period Weeks   Status Achieved     PT SHORT TERM GOAL #3   Title Patient will demonstrate independence with HEP for LE strengthening and balance.   Time 3   Period Weeks   Status On-going     PT SHORT TERM GOAL #4   Title Patient will demonstrate  reduced fall risk as evidenced by improved TUG score to <13.5 seconds.   Time 3   Period Weeks   Status New     PT SHORT TERM GOAL #5   Title Patient will complete Berg Balance Assessment to further assess fall risk and areas of impairment. LTG to be set as appropriate.   Baseline met 09/07/16  54/56 with no LTG set   Status Achieved  PT Long Term Goals - 09/20/16 1034      PT LONG TERM GOAL #1   Title Patient will be independent with HEP and able to verbalize appropriate exercises/machines for him to use [facility]. (target date 10-12-16)   Time 6   Period Weeks   Status New     PT LONG TERM GOAL #2   Title Patient will demonstrate gait velocity >2.62 ft/sec (indicative of safe community ambulator).  (10-12-16)   Time 6   Period Weeks   Status New     PT LONG TERM GOAL #3   Title Patient will ambulate >500 ft modified independent with least restrictive device outdoors on unlevel, paved surfaces (i.e. sidewalk, parking lot).  (10-12-16)   Time 6   Period Weeks   Status New               Plan - 09/20/16 1029    Clinical Impression Statement Patient reported incr soreness after last visit (knee more than back). Modified ther-ex today to account for this (Sci-fit, sidelying and supine). Patient remains very motivated and feels he is making progress towards goals.    Rehab Potential Good   PT Frequency 2x / week   PT Duration 6 weeks   PT Treatment/Interventions ADLs/Self Care Home Management;DME Instruction;Gait training;Functional mobility training;Therapeutic activities;Therapeutic exercise;Balance training;Neuromuscular re-education;Patient/family education   PT Next Visit Plan check TUG (STG not checked 2/8); elliptical (he owns one, does not use) vs sci-fit >5 minutes; use leg press, ?leg ex's with weight/gym; any leg strengthening without aggravating his knees/back :)   PT Home Exercise Plan sitting hamstring stretch, single knee to chest  stretch (supine), "pelvic tilts" in sitting; bridging with theraband, bridging with marching;    Consulted and Agree with Plan of Care Patient      Patient will benefit from skilled therapeutic intervention in order to improve the following deficits and impairments:  Abnormal gait, Decreased balance, Decreased knowledge of use of DME, Decreased strength, Difficulty walking, Impaired sensation  Visit Diagnosis: Other abnormalities of gait and mobility  Muscle weakness (generalized)  History of falling     Problem List Patient Active Problem List   Diagnosis Date Noted  . Gastroesophageal reflux disease without esophagitis 06/22/2016  . Constipation 06/22/2016  . Bladder cancer ([place]) 06/03/2014  . Iron deficiency anemia, unspecified 06/13/2012  . Sleep disorder 04/16/2012  . Nonsustained ventricular tachycardia ([place]) 09/17/2011  . Atypical chest pain 08/27/2011  . GOUT, ACUTE 07/16/2008  . RENAL CYST, RIGHT 07/16/2008  . TRANSAMINASES, SERUM, ELEVATED 07/16/2008  . DIABETES MELLITUS, TYPE II 04/12/2008  . PERIPHERAL EDEMA 04/12/2008  . POSTTRAUMATIC STRESS DISORDER 08/11/2007  . PERIPHERAL NEUROPATHY 08/11/2007  . HYPERTENSION 08/11/2007  . ALLERGIC RHINITIS 08/11/2007  . BENIGN PROSTATIC HYPERTROPHY 08/11/2007  . DEGENERATIVE JOINT DISEASE 08/11/2007  . OSTEOPOROSIS 08/11/2007  . COLON CANCER, HX OF 08/11/2007  . Other postprocedural status(V45.89) 08/11/2007    Rexanne Mano, PT 09/20/2016, 10:55 AM  [facility] [address] [place], [place], [address] Phone: [phone]   Fax:  [phone]  Name: [name] MRN: [id number] Date of Birth: August 02, 1933

## 2016-09-20 NOTE — Patient Instructions (Signed)
Bracing With March in Bridging (Hook-Lying)    Loop green band around your knees. With neutral spine, tighten pelvic floor and abdominals and hold. Lift bottom 3-4 inches and hold, then march in place. March _10__ times (5 each leg). Rest, do a second set. Do _1__ times a day.   Copyright  VHI. All rights reserved.   Abduction    Green band around knees. Lie on your side with hips "stacked one on top of the other." Lift leg up toward ceiling. Hold for 2 seconds. Return.  Repeat __15__ times each leg. Do _1___ sessions per day.  http://[email or web address].exer.us/385   Copyright  VHI. All rights reserved.

## 2016-09-24 ENCOUNTER — Ambulatory Visit: Payer: Medicare Other | Admitting: Physical Therapy

## 2016-09-26 ENCOUNTER — Ambulatory Visit: Payer: Medicare Other | Admitting: Physical Therapy

## 2016-09-27 ENCOUNTER — Ambulatory Visit: Payer: Medicare Other | Admitting: Physical Therapy

## 2016-09-28 ENCOUNTER — Ambulatory Visit: Payer: No Typology Code available for payment source | Admitting: Physical Therapy

## 2016-10-01 ENCOUNTER — Ambulatory Visit: Payer: Medicare Other | Admitting: Physical Therapy

## 2016-10-01 ENCOUNTER — Encounter: Payer: Self-pay | Admitting: Physical Therapy

## 2016-10-01 DIAGNOSIS — M6281 Muscle weakness (generalized): Secondary | ICD-10-CM

## 2016-10-01 DIAGNOSIS — Z9181 History of falling: Secondary | ICD-10-CM

## 2016-10-01 DIAGNOSIS — R2689 Other abnormalities of gait and mobility: Secondary | ICD-10-CM

## 2016-10-01 NOTE — Patient Instructions (Addendum)
Sit to Stand Transfers:  1. Scoot out to the edge of the chair 2. Place your feet flat on the floor, shoulder width apart.  Make sure your feet are tucked just under your knees. 3. Lean forward (nose over toes) with momentum, and stand up tall with your best posture.  If you need to use your arms, use them as a quick boost up to stand. 4. If you are in a low or soft chair, you can lean back and then forward up to stand, in order to get more momentum. 5. Once you are standing, make sure you are looking ahead and standing tall.  To sit down:  1. Back up until you feel the chair behind your legs. 2. Bend at you hips, reaching  Back for you chair, if needed, then slowly squat to sit down on your chair.  Stand Up    Copyright  VHI. All rights reserved.  Stand Up    Sit on edge of chair with arms hanging at sides, or across chest. Swing arms upward while standing. Repeat _10___ times. Do 2 sets per day. At least 5 days/week. Use bar height seat to begin and work to progressively lower seat.   Copyright  VHI. All rights reserved.

## 2016-10-01 NOTE — Therapy (Signed)
[place] [address] [place], [place], [address] Phone: [phone]   Fax:  [phone]  Physical Therapy Treatment  Patient Details  Name: [name] MRN: [id number] Date of Birth: October 15, 1932 Referring Provider: Dr. Marsh [name]  Encounter Date: 10/01/2016      PT End of Session - 10/01/16 1740    Visit Number 7   Number of Visits 12   Authorization Type VA--also has medicare and tricare; 12 visits over 8 weeks   Authorization Time Period 08/31/16 to 10/30/16   Authorization - Visit Number 7   Authorization - Number of Visits 12   PT Start Time 7408   PT Stop Time 1018   PT Time Calculation (min) 44 min   Activity Tolerance Patient tolerated treatment well   Behavior During Therapy [facility] for tasks assessed/performed      Past Medical History:  Diagnosis Date  . Allergic rhinitis   . Anemia   . Anxiety   . Bladder tumor    WAS HAVING BLOOD IN URINE - CLEARED BUT OCCAS CLOT NOW  . BPH (benign prostatic hypertrophy)    FREQUENT URINATION   . Cancer ([place])   . Cataract    bilateral extrraction and lens implants '88-,'89  . Depression   . Diverticulosis   . DJD (degenerative joint disease)   . DM2 (diabetes mellitus, type 2) ([place])   . Dyspepsia   . GERD (gastroesophageal reflux disease)   . Glaucoma   . Glaucoma   . Headache   . Hearing loss    bilateral  . History of colon cancer   . Hypertension   . Multiple pulmonary nodules    SEEN ON CT CHEST DONE 05/25/14 AT [facility]  . Nonsustained ventricular tachycardia ([facility])    PAST HISTORY  . Osteoporosis   . Peripheral neuropathy ([place])   . Posttraumatic stress disorder   . Restless leg syndrome   . Tubular adenoma 02/17/2008    Past Surgical History:  Procedure Laterality Date  . COLON SURGERY     for colon cancer march 2005  . COLONOSCOPY    . CYSTO N/A 06/03/2014   Procedure: CYSTO;  Surgeon: [name], MD;  Location:  [facility];  Service: Urology;  Laterality: N/A;  . CYSTOSCOPY WITH URETEROSCOPY Bilateral 07/26/2015   Procedure: CYSTO, BILATERAL RENAL WASHING WITH    URETEROSCOPY, TUR PROSTATE BIOPSY;  Surgeon: [name], MD;  [facility];  Service: Urology;  Laterality: Bilateral;  . EYE SURGERY     bilateral cataract surgery   . INNER EAR SURGERY     excision of tumor, then two surgeries: prosthesis, then had to remove.  '10  . JOINT REPLACEMENT    . KNEE ARTHROSCOPY    . PROSTATE BIOPSY N/A 07/26/2015   Procedure: PROSTATE BIOPSY;  Surgeon: [name], MD;  [facility];  Service: Urology;  Laterality: N/A;  . TOTAL KNEE ARTHROPLASTY     right  . TRANSURETHRAL RESECTION OF BLADDER TUMOR N/A 06/24/2014   Procedure: TRANSURETHRAL RESECTION OF BLADDER TUMOR (TURBT);  Surgeon: [name], MD;  [facility];  Service: Urology;  Laterality: N/A;  . TRANSURETHRAL RESECTION OF BLADDER TUMOR WITH GYRUS (TURBT-GYRUS) N/A 06/03/2014   Procedure: TRANSURETHRAL RESECTION OF BLADDER TUMOR WITH GYRUS (TURBT-GYRUS);  Surgeon: [name], MD;  [facility];  Service: Urology;  Laterality: N/A;  . TRANSURETHRAL RESECTION OF PROSTATE N/A 07/26/2015   Procedure: BLADDER TRANSURETHRAL RESECTION ;  Surgeon: [name], MD;  [facility];  Service: Urology;  Laterality: N/A;    There were no vitals filed for this visit.      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 Did walk at the mall, but otherwise did not do his exercises.    Patient Stated Goals limit/prevent falls; [facility]   Currently in Pain? Yes   Pain Score 7    Pain Location Knee   Pain Orientation Right   Pain Descriptors / Indicators Aching;Sore   Pain Type Chronic pain   Pain Onset More than a month ago   Pain Frequency Constant   Aggravating Factors  any standing, walking   Pain Relieving Factors sitting   Multiple Pain  Sites Yes   Pain Score 6   Pain Location Back   Pain Orientation Lower   Pain Descriptors / Indicators Aching;Nagging   Pain Type Chronic pain   Pain Onset More than a month ago   Pain Frequency Constant   Aggravating Factors  prolonged standing or walking   Pain Relieving Factors sitting, lying down   Effect of Pain on Daily Activities "I just push through and do what I have to do"                         [facility] Adult PT Treatment/Exercise - 10/01/16 1731      Transfers   Transfers Sit to Stand;Stand to Sit   Sit to Stand 6: Modified independent (Device/Increase time)   Number of Reps 10 reps;2 sets   Comments from elevated mat to ~barstool height to decrease strain on his knees; no UE use; 2nd set with mat table ~4" lower     Ambulation/Gait   Ambulation/Gait Assistance 6: Modified independent (Device/Increase time)   Ambulation Distance (Feet) 150 Feet  60, 40   Assistive device Straight cane;None   Gait Pattern Step-through pattern;Decreased stance time - right;Trunk flexed   Ambulation Surface Level;Indoor   Gait velocity 1.78 ft/sec (no cane)   Gait Comments Pt reports he feels more stiff today after gone last week     Standardized Balance Assessment   Standardized Balance Assessment Timed Up and Go Test     Timed Up and Go Test   TUG Normal TUG   Normal TUG (seconds) 18.69  (no cane)     Lumbar Exercises: Aerobic   Elliptical level 1; 3 minutes forward     Lumbar Exercises: Seated   Other Seated Lumbar Exercises posterior pelvic tilt with "slouch" followed by upright posture and anterior pelvic tilt x 15 with manual facilitation to assist movement and incr ROM; noted incr ROM      Knee/Hip Exercises: Standing   Knee Flexion Strengthening;Both;2 sets;10 reps  red band x 10; 3# weights on ankles x 10   Hip Abduction Stengthening;Both;1 set;10 reps;Knee straight  red band   Lateral Step Up Both;1 set;10 reps;Hand Hold: 2;Step Height: 4"                 PT Education - 10/01/16 1739    Education provided Yes   Education Details added sit to stand to HEP (higher surface working down to lower surface)   Person(s) Educated Patient   Methods Explanation;Demonstration;Verbal cues;Handout   Comprehension Verbalized understanding;Returned demonstration;Need further instruction  PT Short Term Goals - 10/01/16 1746      PT SHORT TERM GOAL #1   Title Patient will verbalize understanding of fall prevention techniques (goal by 09-21-16)   Time 3   Period Weeks   Status Achieved     PT SHORT TERM GOAL #2   Title Patient will ambulate 300 ft modified independent with least restrictive assistive device on indoor, level surfaces.    Time 3   Period Weeks   Status Achieved     PT SHORT TERM GOAL #3   Title Patient will demonstrate independence with HEP for LE strengthening and balance.   Time 3   Period Weeks   Status On-going     PT SHORT TERM GOAL #4   Title Patient will demonstrate reduced fall risk as evidenced by improved TUG score to <13.5 seconds.   Baseline 09/28/16 18.69 sec (incr after 1 week missed appts)   Time 3   Period Weeks   Status On-going     PT SHORT TERM GOAL #5   Title Patient will complete Berg Balance Assessment to further assess fall risk and areas of impairment. LTG to be set as appropriate.   Baseline met 09/07/16  54/56 with no LTG set   Status Achieved           PT Long Term Goals - 09/20/16 1034      PT LONG TERM GOAL #1   Title Patient will be independent with HEP and able to verbalize appropriate exercises/machines for him to use [facility]. (target date 10-12-16)   Time 6   Period Weeks   Status New     PT LONG TERM GOAL #2   Title Patient will demonstrate gait velocity >2.62 ft/sec (indicative of safe community ambulator).  (10-12-16)   Time 6   Period Weeks   Status New     PT LONG TERM GOAL #3   Title Patient will ambulate >500 ft  modified independent with least restrictive device outdoors on unlevel, paved surfaces (i.e. sidewalk, parking lot).  (10-12-16)   Time 6   Period Weeks   Status New               Plan - 10/01/16 1741    Clinical Impression Statement Patient returned after missing last week due to travel to [place] for [employer] and funeral. Moving more slowly and stiffly today and reports he did not do his exercises when out-of-town. Skilled session focused on leg strengthening (including adding to HEP) and balance. Progress towards STGs assessed with 3 of 5 met and 2 on-going.    Rehab Potential Good   PT Frequency 2x / week   PT Duration 6 weeks   PT Treatment/Interventions ADLs/Self Care Home Management;DME Instruction;Gait training;Functional mobility training;Therapeutic activities;Therapeutic exercise;Balance training;Neuromuscular re-education;Patient/family education   PT Next Visit Plan elliptical > 3 minutes fwd (he owns one, does not use) vs sci-fit >5 minutes; any leg strengthening without aggravating his knees/back :)   PT Home Exercise Plan sitting hamstring stretch, single knee to chest stretch (supine), "pelvic tilts" in sitting; bridging with theraband, bridging with marching; sidelying hip abdct with band   Consulted and Agree with Plan of Care Patient      Patient will benefit from skilled therapeutic intervention in order to improve the following deficits and impairments:  Abnormal gait, Decreased balance, Decreased knowledge of use of DME, Decreased strength, Difficulty walking, Impaired sensation  Visit Diagnosis: Other abnormalities of  gait and mobility  Muscle weakness (generalized)  History of falling     Problem List Patient Active Problem List   Diagnosis Date Noted  . Gastroesophageal reflux disease without esophagitis 06/22/2016  . Constipation 06/22/2016  . Bladder cancer ([place]) 06/03/2014  . Iron deficiency anemia, unspecified 06/13/2012  . Sleep  disorder 04/16/2012  . Nonsustained ventricular tachycardia ([place]) 09/17/2011  . Atypical chest pain 08/27/2011  . GOUT, ACUTE 07/16/2008  . RENAL CYST, RIGHT 07/16/2008  . TRANSAMINASES, SERUM, ELEVATED 07/16/2008  . DIABETES MELLITUS, TYPE II 04/12/2008  . PERIPHERAL EDEMA 04/12/2008  . POSTTRAUMATIC STRESS DISORDER 08/11/2007  . PERIPHERAL NEUROPATHY 08/11/2007  . HYPERTENSION 08/11/2007  . ALLERGIC RHINITIS 08/11/2007  . BENIGN PROSTATIC HYPERTROPHY 08/11/2007  . DEGENERATIVE JOINT DISEASE 08/11/2007  . OSTEOPOROSIS 08/11/2007  . COLON CANCER, HX OF 08/11/2007  . Other postprocedural status(V45.89) 08/11/2007    Rexanne Mano, PT 10/01/2016, 5:48 PM  [place] [address] [place], [place], [address] Phone: [phone]   Fax:  [phone]  Name: [name] MRN: [id number] Date of Birth: 1933/03/17

## 2016-10-03 ENCOUNTER — Ambulatory Visit: Payer: Medicare Other | Admitting: Physical Therapy

## 2016-10-03 DIAGNOSIS — R2689 Other abnormalities of gait and mobility: Secondary | ICD-10-CM | POA: Diagnosis not present

## 2016-10-03 DIAGNOSIS — M6281 Muscle weakness (generalized): Secondary | ICD-10-CM

## 2016-10-03 DIAGNOSIS — Z9181 History of falling: Secondary | ICD-10-CM | POA: Diagnosis not present

## 2016-10-04 NOTE — Therapy (Signed)
[facility] [address] [place], [place], [address] Phone: [phone]   Fax:  [phone]  Physical Therapy Treatment  Patient Details  Name: [name] MRN: [id number] Date of Birth: May 23, 1933 Referring Provider: Dr. Marsh [name]  Encounter Date: 10/03/2016      PT End of Session - 10/04/16 1937    Visit Number 8   Number of Visits 12   Authorization Type VA--also has medicare and tricare; 12 visits over 8 weeks   Authorization Time Period 08/31/16 to 10/30/16   Authorization - Visit Number 8   Authorization - Number of Visits 12   PT Start Time 0932   PT Stop Time 1016   PT Time Calculation (min) 44 min      Past Medical History:  Diagnosis Date  . Allergic rhinitis   . Anemia   . Anxiety   . Bladder tumor    WAS HAVING BLOOD IN URINE - CLEARED BUT OCCAS CLOT NOW  . BPH (benign prostatic hypertrophy)    FREQUENT URINATION   . Cancer ([place])   . Cataract    bilateral extrraction and lens implants '88-,'89  . Depression   . Diverticulosis   . DJD (degenerative joint disease)   . DM2 (diabetes mellitus, type 2) ([place])   . Dyspepsia   . GERD (gastroesophageal reflux disease)   . Glaucoma   . Glaucoma   . Headache   . Hearing loss    bilateral  . History of colon cancer   . Hypertension   . Multiple pulmonary nodules    SEEN ON CT CHEST DONE 05/25/14 AT [facility]  . Nonsustained ventricular tachycardia ([facility])    PAST HISTORY  . Osteoporosis   . Peripheral neuropathy ([place])   . Posttraumatic stress disorder   . Restless leg syndrome   . Tubular adenoma 02/17/2008    Past Surgical History:  Procedure Laterality Date  . COLON SURGERY     for colon cancer march 2005  . COLONOSCOPY    . CYSTO N/A 06/03/2014   Procedure: CYSTO;  Surgeon: [name], MD;  Location: [facility];  Service: Urology;  Laterality: N/A;  . CYSTOSCOPY WITH URETEROSCOPY Bilateral 07/26/2015   Procedure:  CYSTO, BILATERAL RENAL WASHING WITH    URETEROSCOPY, TUR PROSTATE BIOPSY;  Surgeon: [name], MD;  Location: [facility];  Service: Urology;  Laterality: Bilateral;  . EYE SURGERY     bilateral cataract surgery   . INNER EAR SURGERY     excision of tumor, then two surgeries: prosthesis, then had to remove.  '10  . JOINT REPLACEMENT    . KNEE ARTHROSCOPY    . PROSTATE BIOPSY N/A 07/26/2015   Procedure: PROSTATE BIOPSY;  Surgeon: [name], MD;  [facility];  Service: Urology;  Laterality: N/A;  . TOTAL KNEE ARTHROPLASTY     right  . TRANSURETHRAL RESECTION OF BLADDER TUMOR N/A 06/24/2014   Procedure: TRANSURETHRAL RESECTION OF BLADDER TUMOR (TURBT);  Surgeon: [name], MD;  [facility];  Service: Urology;  Laterality: N/A;  . TRANSURETHRAL RESECTION OF BLADDER TUMOR WITH GYRUS (TURBT-GYRUS) N/A 06/03/2014   Procedure: TRANSURETHRAL RESECTION OF BLADDER TUMOR WITH GYRUS (TURBT-GYRUS);  Surgeon: [name], MD;  [facility];  Service: Urology;  Laterality: N/A;  . TRANSURETHRAL RESECTION OF PROSTATE N/A 07/26/2015   Procedure: BLADDER TRANSURETHRAL RESECTION ;  Surgeon: [name], MD;  [facility];  Service: Urology;  Laterality: N/A;  There were no vitals filed for this visit.      Subjective Assessment - 10/04/16 1932    Subjective Pt states he has not done alot of exercises - has been busy taking care of other people   Patient Stated Goals limit/prevent falls; [facility]                         [facility] Adult PT Treatment/Exercise - 10/04/16 0001      Transfers   Transfers Sit to Stand;Stand to Sit   Sit to Stand 5: Supervision   Number of Reps 10 reps   Comments performed on blue [employer]     Lumbar Exercises: Aerobic   Elliptical level 1 1" due to fatigue     Lumbar Exercises: Machines for Strengthening   Leg Press 70# bil. LE's 10 reps 2 sets     Lumbar Exercises: Standing   Heel Raises 10 reps     Pt  performed standing hip extension, abduction and flexion exercises with green theraband 10 reps each leg, each direction         Balance Exercises - 10/04/16 1935      Balance Exercises: Standing   Rockerboard Anterior/posterior;Lateral;Intermittent UE support   Sidestepping 2 reps   Step Over Hurdles / Cones 10 reps each leg             PT Short Term Goals - 10/01/16 1746      PT SHORT TERM GOAL #1   Title Patient will verbalize understanding of fall prevention techniques (goal by 09-21-16)   Time 3   Period Weeks   Status Achieved     PT SHORT TERM GOAL #2   Title Patient will ambulate 300 ft modified independent with least restrictive assistive device on indoor, level surfaces.    Time 3   Period Weeks   Status Achieved     PT SHORT TERM GOAL #3   Title Patient will demonstrate independence with HEP for LE strengthening and balance.   Time 3   Period Weeks   Status On-going     PT SHORT TERM GOAL #4   Title Patient will demonstrate reduced fall risk as evidenced by improved TUG score to <13.5 seconds.   Baseline 09/28/16 18.69 sec (incr after 1 week missed appts)   Time 3   Period Weeks   Status On-going     PT SHORT TERM GOAL #5   Title Patient will complete Berg Balance Assessment to further assess fall risk and areas of impairment. LTG to be set as appropriate.   Baseline met 09/07/16  54/56 with no LTG set   Status Achieved           PT Long Term Goals - 09/20/16 1034      PT LONG TERM GOAL #1   Title Patient will be independent with HEP and able to verbalize appropriate exercises/machines for him to use [facility]. (target date 10-12-16)   Time 6   Period Weeks   Status New     PT LONG TERM GOAL #2   Title Patient will demonstrate gait velocity >2.62 ft/sec (indicative of safe community ambulator).  (10-12-16)   Time 6   Period Weeks   Status New     PT LONG TERM GOAL #3   Title Patient will ambulate >500 ft modified  independent with least restrictive device outdoors on unlevel,  paved surfaces (i.e. sidewalk, parking lot).  (10-12-16)   Time 6   Period Weeks   Status New               Plan - 10/04/16 1938    Clinical Impression Statement Pt fatigued easily today - required frequent rest breaks; did well with balance and strengthening exercises with c/o lightheadedness on elliptical so this exercise was stopped due to this c/o; pt felt better after short seated rest break   Rehab Potential Good   PT Frequency 2x / week   PT Duration 6 weeks   PT Treatment/Interventions ADLs/Self Care Home Management;DME Instruction;Gait training;Functional mobility training;Therapeutic activities;Therapeutic exercise;Balance training;Neuromuscular re-education;Patient/family education   PT Next Visit Plan cont strengthening exercises   Consulted and Agree with Plan of Care Patient      Patient will benefit from skilled therapeutic intervention in order to improve the following deficits and impairments:  Abnormal gait, Decreased balance, Decreased knowledge of use of DME, Decreased strength, Difficulty walking, Impaired sensation  Visit Diagnosis: Other abnormalities of gait and mobility  Muscle weakness (generalized)     Problem List Patient Active Problem List   Diagnosis Date Noted  . Gastroesophageal reflux disease without esophagitis 06/22/2016  . Constipation 06/22/2016  . Bladder cancer ([place]) 06/03/2014  . Iron deficiency anemia, unspecified 06/13/2012  . Sleep disorder 04/16/2012  . Nonsustained ventricular tachycardia ([place]) 09/17/2011  . Atypical chest pain 08/27/2011  . GOUT, ACUTE 07/16/2008  . RENAL CYST, RIGHT 07/16/2008  . TRANSAMINASES, SERUM, ELEVATED 07/16/2008  . DIABETES MELLITUS, TYPE II 04/12/2008  . PERIPHERAL EDEMA 04/12/2008  . POSTTRAUMATIC STRESS DISORDER 08/11/2007  . PERIPHERAL NEUROPATHY 08/11/2007  . HYPERTENSION 08/11/2007  . ALLERGIC RHINITIS 08/11/2007  . BENIGN  PROSTATIC HYPERTROPHY 08/11/2007  . DEGENERATIVE JOINT DISEASE 08/11/2007  . OSTEOPOROSIS 08/11/2007  . COLON CANCER, HX OF 08/11/2007  . Other postprocedural status(V45.89) 08/11/2007    Illa Enlow, [place], PT 10/04/2016, 7:41 PM  [facility] [address] [place], [place], [address] Phone: [phone]   Fax:  [phone]  Name: TIRRELL BUCHBERGER MRN: [id number] Date of Birth: September 09, 1932

## 2016-10-08 ENCOUNTER — Ambulatory Visit: Payer: Medicare Other | Admitting: Physical Therapy

## 2016-10-08 DIAGNOSIS — R2689 Other abnormalities of gait and mobility: Secondary | ICD-10-CM

## 2016-10-08 DIAGNOSIS — M6281 Muscle weakness (generalized): Secondary | ICD-10-CM

## 2016-10-08 DIAGNOSIS — Z9181 History of falling: Secondary | ICD-10-CM | POA: Diagnosis not present

## 2016-10-08 NOTE — Therapy (Signed)
[facility] [address] [place], [place], [address] Phone: [phone]   Fax:  [phone]  Physical Therapy Treatment  Patient Details  Name: [name] MRN: [id number] Date of Birth: 10-Mar-1933 Referring Provider: Dr. Marsh [name]  Encounter Date: 10/08/2016      PT End of Session - 10/08/16 1030    Visit Number 9   Number of Visits 12   Authorization Type VA--also has medicare and tricare; 12 visits over 8 weeks   Authorization Time Period 08/31/16 to 10/30/16   Authorization - Visit Number 9   Authorization - Number of Visits 12   PT Start Time 0263   PT Stop Time 1015   PT Time Calculation (min) 44 min      Past Medical History:  Diagnosis Date  . Allergic rhinitis   . Anemia   . Anxiety   . Bladder tumor    WAS HAVING BLOOD IN URINE - CLEARED BUT OCCAS CLOT NOW  . BPH (benign prostatic hypertrophy)    FREQUENT URINATION   . Cancer ([place])   . Cataract    bilateral extrraction and lens implants '88-,'89  . Depression   . Diverticulosis   . DJD (degenerative joint disease)   . DM2 (diabetes mellitus, type 2) ([place])   . Dyspepsia   . GERD (gastroesophageal reflux disease)   . Glaucoma   . Glaucoma   . Headache   . Hearing loss    bilateral  . History of colon cancer   . Hypertension   . Multiple pulmonary nodules    SEEN ON CT CHEST DONE [facility]/25[facility]14 [facility]  . Nonsustained ventricular tachycardia ([facility])    PAST HISTORY  . Osteoporosis   . Peripheral neuropathy ([place])   . Posttraumatic stress disorder   . Restless leg syndrome   . Tubular adenoma 02/17/2008    Past Surgical History:  Procedure Laterality Date  . COLON SURGERY     for colon cancer march 2005  . COLONOSCOPY    . CYSTO N/A 06/03/2014   Procedure: CYSTO;  Surgeon: [name], MD;  [facility];  Service: Urology;  Laterality: N/A;  . CYSTOSCOPY WITH URETEROSCOPY Bilateral 07/26/2015   Procedure:  CYSTO, BILATERAL RENAL WASHING WITH    URETEROSCOPY, TUR PROSTATE BIOPSY;  Surgeon: [name], MD;  [facility]: [facility];  Service: Urology;  Laterality: Bilateral;  . EYE SURGERY     bilateral cataract surgery   . INNER EAR SURGERY     excision of tumor, then two surgeries: prosthesis, then had to remove.  '10  . JOINT REPLACEMENT    . KNEE ARTHROSCOPY    . PROSTATE BIOPSY N/A 07/26/2015   Procedure: PROSTATE BIOPSY;  Surgeon: [name], MD;  [facility];  Service: Urology;  Laterality: N/A;  . TOTAL KNEE ARTHROPLASTY     right  . TRANSURETHRAL RESECTION OF BLADDER TUMOR N/A 06/24/2014   Procedure: TRANSURETHRAL RESECTION OF BLADDER TUMOR (TURBT);  Surgeon: [name], MD;  [facility];  Service: Urology;  Laterality: N/A;  . TRANSURETHRAL RESECTION OF BLADDER TUMOR WITH GYRUS (TURBT-GYRUS) N/A 06/03/2014   Procedure: TRANSURETHRAL RESECTION OF BLADDER TUMOR WITH GYRUS (TURBT-GYRUS);  Surgeon: [name], MD;  [facility];  Service: Urology;  Laterality: N/A;  . TRANSURETHRAL RESECTION OF PROSTATE N/A 07/26/2015   Procedure: BLADDER TRANSURETHRAL RESECTION ;  Surgeon: [name], MD;  [facility];  Service: Urology;  Laterality: N/A;  There were no vitals filed for this visit.      Subjective Assessment - 10/08/16 1025    Subjective Pt states his back started hurting on Saturday - is using rollator today, rather than cane; reports he used to get shots in his back but hasn't had one in past 3-4 years (epidural?); states he is going to call MD today to get appt for another injection if possible   Patient Stated Goals limit/prevent falls; [facility]                         [facility] Adult PT Treatment/Exercise - 10/08/16 0942      Ambulation/Gait   Ambulation/Gait Yes   Ambulation/Gait Assistance 6: Modified independent (Device/Increase time)   Ambulation Distance (Feet) 100 Feet   Assistive device Rollator   Gait  Pattern Step-through pattern   Ambulation Surface Level;Indoor     Lumbar Exercises: Machines for Strengthening   Leg Press 60# bil. LE's 10 reps 2 sets     Lumbar Exercises: Standing   Heel Raises 10 reps     Knee/Hip Exercises: Standing   Forward Step Up Both;1 set;10 reps     Knee/Hip Exercises: Seated   Sit to Sand 1 set;5 reps             Balance Exercises - 10/08/16 1029      Balance Exercises: Standing   Sidestepping 2 reps  along counter   Step Over Hurdles / Cones 10 reps each leg at counter - stepping over orange hurdle   Other Standing Exercises Cone taps (3 used) with UE support on counter as needed - for improved SLS     Sidestepping along counter without UE support - 2 reps with SBA Crossovers - front only 2 reps along counter (10' x 2) with SBA        PT Short Term Goals - 10/01/16 1746      PT SHORT TERM GOAL #1   Title Patient will verbalize understanding of fall prevention techniques (goal by 09-21-16)   Time 3   Period Weeks   Status Achieved     PT SHORT TERM GOAL #2   Title Patient will ambulate 300 ft modified independent with least restrictive assistive device on indoor, level surfaces.    Time 3   Period Weeks   Status Achieved     PT SHORT TERM GOAL #3   Title Patient will demonstrate independence with HEP for LE strengthening and balance.   Time 3   Period Weeks   Status On-going     PT SHORT TERM GOAL #4   Title Patient will demonstrate reduced fall risk as evidenced by improved TUG score to <13.5 seconds.   Baseline 09/28/16 18.69 sec (incr after 1 week missed appts)   Time 3   Period Weeks   Status On-going     PT SHORT TERM GOAL #5   Title Patient will complete Berg Balance Assessment to further assess fall risk and areas of impairment. LTG to be set as appropriate.   Baseline met 09/07/16  54/56 with no LTG set   Status Achieved           PT Long Term Goals - 09/20/16 1034      PT LONG TERM GOAL #1   Title  Patient will be independent with HEP and able to verbalize appropriate exercises/machines for him to use with return [facility] the  [facility] for [facility]. (target date 10-12-16)   Time 6   Period Weeks   Status New     PT LONG TERM GOAL #2   Title Patient will demonstrate gait velocity >2.62 ft/sec (indicative of safe community ambulator).  (10-12-16)   Time 6   Period Weeks   Status New     PT LONG TERM GOAL #3   Title Patient will ambulate >500 ft modified independent with least restrictive device outdoors on unlevel, paved surfaces (i.e. sidewalk, parking lot).  (10-12-16)   Time 6   Period Weeks   Status New               Plan - 10/08/16 1032    Clinical Impression Statement Pt reporting increased low back pain today, necessitating need for use of RW with seat so that pt can sit when needed.  Pt reports back pain started on Saturday, 10-06-16; reports he plans to schedule appt with MD for injection.  Mobility limited with decreased standing tolerance today due to c/o LBP.   Rehab Potential Good   PT Frequency 2x / week   PT Duration 6 weeks   PT Treatment/Interventions ADLs/Self Care Home Management;DME Instruction;Gait training;Functional mobility training;Therapeutic activities;Therapeutic exercise;Balance training;Neuromuscular re-education;Patient/family education   PT Next Visit Plan check LTG's, D/C   PT Home Exercise Plan sitting hamstring stretch, single knee to chest stretch (supine), "pelvic tilts" in sitting; bridging with theraband, bridging with marching; sidelying hip abdct with band   Consulted and Agree with Plan of Care Patient      Patient will benefit from skilled therapeutic intervention in order to improve the following deficits and impairments:  Abnormal gait, Decreased balance, Decreased knowledge of use of DME, Decreased strength, Difficulty walking, Impaired sensation  Visit Diagnosis: Other abnormalities of gait and mobility  Muscle weakness  (generalized)     Problem List Patient Active Problem List   Diagnosis Date Noted  . Gastroesophageal reflux disease without esophagitis 06/22/2016  . Constipation 06/22/2016  . Bladder cancer ([place]) 06/03/2014  . Iron deficiency anemia, unspecified 06/13/2012  . Sleep disorder 04/16/2012  . Nonsustained ventricular tachycardia ([place]) 09/17/2011  . Atypical chest pain 08/27/2011  . GOUT, ACUTE 07/16/2008  . RENAL CYST, RIGHT 07/16/2008  . TRANSAMINASES, SERUM, ELEVATED 07/16/2008  . DIABETES MELLITUS, TYPE II 04/12/2008  . PERIPHERAL EDEMA 04/12/2008  . POSTTRAUMATIC STRESS DISORDER 08/11/2007  . PERIPHERAL NEUROPATHY 08/11/2007  . HYPERTENSION 08/11/2007  . ALLERGIC RHINITIS 08/11/2007  . BENIGN PROSTATIC HYPERTROPHY 08/11/2007  . DEGENERATIVE JOINT DISEASE 08/11/2007  . OSTEOPOROSIS 08/11/2007  . COLON CANCER, HX OF 08/11/2007  . Other postprocedural status(V45.89) 08/11/2007    Damyon Mullane, [place], PT 10/08/2016, 10:37 AM  [facility] [address] [place], [place], [address] Phone: [phone]   Fax:  [phone]  Name: KAID SEEBERGER MRN: [id number] Date of Birth: [birth year]

## 2016-10-10 ENCOUNTER — Encounter: Payer: Self-pay | Admitting: Physical Therapy

## 2016-10-10 ENCOUNTER — Ambulatory Visit: Payer: Medicare Other | Admitting: Physical Therapy

## 2016-10-10 DIAGNOSIS — R2689 Other abnormalities of gait and mobility: Secondary | ICD-10-CM | POA: Diagnosis not present

## 2016-10-10 DIAGNOSIS — M6281 Muscle weakness (generalized): Secondary | ICD-10-CM | POA: Diagnosis not present

## 2016-10-10 DIAGNOSIS — Z9181 History of falling: Secondary | ICD-10-CM | POA: Diagnosis not present

## 2016-10-10 NOTE — Therapy (Signed)
[facility] [address] [place], [place], [address] Phone: [phone]   Fax:  [phone]  Physical Therapy Treatment  Patient Details  Name: [name] MRN: [id number] Date of Birth: 29-Jan-1933 Referring Provider: Dr. Marsh [name]  Encounter Date: 10/10/2016      PT End of Session - 10/10/16 1610    Visit Number 10   Number of Visits 12   Authorization Type [facility]--also has medicare and tricare; 12 visits over 8 weeks   Authorization Time Period 08/31/16 to 10/30/16   Authorization - Visit Number 10   Authorization - Number of Visits 12   PT Start Time 0933   PT Stop Time 1001   PT Time Calculation (min) 28 min   Activity Tolerance Patient tolerated treatment well;No increased pain   Behavior During Therapy WFL for tasks assessed/performed      Past Medical History:  Diagnosis Date  . Allergic rhinitis   . Anemia   . Anxiety   . Bladder tumor    WAS HAVING BLOOD IN URINE - CLEARED BUT OCCAS CLOT NOW  . BPH (benign prostatic hypertrophy)    FREQUENT URINATION   . Cancer ([place])   . Cataract    bilateral extrraction and lens implants '88-,'89  . Depression   . Diverticulosis   . DJD (degenerative joint disease)   . DM2 (diabetes mellitus, type 2) ([place])   . Dyspepsia   . GERD (gastroesophageal reflux disease)   . Glaucoma   . Glaucoma   . Headache   . Hearing loss    bilateral  . History of colon cancer   . Hypertension   . Multiple pulmonary nodules    SEEN ON CT CHEST DONE 05/25/14 AT [facility]  . Nonsustained ventricular tachycardia (H[facility])    PAST HISTORY  . Osteoporosis   . Peripheral neuropathy ([place])   . Posttraumatic stress disorder   . Restless leg syndrome   . Tubular adenoma 02/17/2008    Past Surgical History:  Procedure Laterality Date  . COLON SURGERY     for colon cancer march 2005  . COLONOSCOPY    . CYSTO N/A 06/03/2014   Procedure: CYSTO;  Surgeon: [name], MD;  Location: [facility];  Service: Urology;  Laterality: N/A;  . CYSTOSCOPY WITH URETEROSCOPY Bilateral 07/26/2015   Procedure: CYSTO, BILATERAL RENAL WASHING WITH    URETEROSCOPY, TUR PROSTATE BIOPSY;  Surgeon: [name], MD;  Location: [facility];  Service: Urology;  Laterality: Bilateral;  . EYE SURGERY     bilateral cataract surgery   . INNER EAR SURGERY     excision of tumor, then two surgeries: prosthesis, then had to remove.  '10  . JOINT REPLACEMENT    . KNEE ARTHROSCOPY    . PROSTATE BIOPSY N/A 07/26/2015   Procedure: PROSTATE BIOPSY;  Surgeon: [name], MD;  Location: [facility];  Service: Urology;  Laterality: N/A;  . TOTAL KNEE ARTHROPLASTY     right  . TRANSURETHRAL RESECTION OF BLADDER TUMOR N/A 06/24/2014   Procedure: TRANSURETHRAL RESECTION OF BLADDER TUMOR (TURBT);  Surgeon: [name], MD;  Location: [facility];  Service: Urology;  Laterality: N/A;  . TRANSURETHRAL RESECTION OF BLADDER TUMOR WITH GYRUS (TURBT-GYRUS) N/A 06/03/2014   Procedure: TRANSURETHRAL RESECTION OF BLADDER TUMOR WITH GYRUS (TURBT-GYRUS);  Surgeon: [name], MD;  Location: [facility];  Service: Urology;  Laterality: N/A;  . TRANSURETHRAL RESECTION OF PROSTATE N/A 07/26/2015   Procedure: BLADDER TRANSURETHRAL  RESECTION ;  Surgeon: [name], MD;  Location: [facility];  Service: Urology;  Laterality: N/A;    There were no vitals filed for this visit.      Subjective Assessment - 10/10/16 0934    Subjective Feeling better (walking on cane). Feels ready to return to working out at the gym with his buddy. Able to verbalize his plan. Thankful for all the education he's had via OPPT   Patient Stated Goals limit/prevent falls; [facility]   Currently in Pain? Yes   Pain Score 7    Pain Orientation Lower   Pain Descriptors / Indicators Aching;Sore   Pain Type Chronic pain   Pain Onset More than a month ago   Multiple Pain Sites Yes   Pain Score 7   Pain Location Knee   Pain  Orientation Right;Left   Pain Descriptors / Indicators Aching   Pain Type Chronic pain   Pain Onset More than a month ago   Pain Frequency Constant                           [facility] Adult PT Treatment/Exercise - 10/10/16 0001      Transfers   Transfers Sit to Stand;Stand to Sit   Sit to Stand 6: Modified independent (Device/Increase time);5: Supervision   Sit to Stand Details (indicate cue type and reason) with cane vs without   Stand to Sit 6: Modified independent (Device/Increase time);With upper extremity assist     Ambulation/Gait   Ambulation/Gait Assistance 6: Modified independent (Device/Increase time)   Ambulation/Gait Assistance Details pt walking slower than usual due to pain; walking "gingerly" to avoid incr pain   Ambulation Distance (Feet) 100 Feet  110, 50, 75   Assistive device Straight cane;None   Gait Pattern Step-through pattern;Decreased step length - right;Decreased step length - left;Decreased trunk rotation;Wide base of support;Decreased arm swing - right;Decreased arm swing - left   Ambulation Surface Level;Indoor   Gait velocity 1.21 ft/sec (with cane; slower without)     Timed Up and Go Test   Normal TUG (seconds) 21.81                PT Education - 10/10/16 1609    Education provided Yes   Education Details Post PT plan for exercise. Results of tests today and likely due to pain today compared to on evaluation.   Person(s) Educated Patient   Methods Explanation   Comprehension Verbalized understanding          PT Short Term Goals - 10/01/16 1746      PT SHORT TERM GOAL #1   Title Patient will verbalize understanding of fall prevention techniques (goal by 09-21-16)   Time 3   Period Weeks   Status Achieved     PT SHORT TERM GOAL #2   Title Patient will ambulate 300 ft modified independent with least restrictive assistive device on indoor, level surfaces.    Time 3   Period Weeks   Status Achieved     PT SHORT TERM  GOAL #3   Title Patient will demonstrate independence with HEP for LE strengthening and balance.   Time 3   Period Weeks   Status On-going     PT SHORT TERM GOAL #4   Title Patient will demonstrate reduced fall risk as evidenced by improved TUG score to <13.5 seconds.   Baseline 09/28/16 18.69 sec (incr after 1 week  missed appts)   Time 3   Period Weeks   Status On-going     PT SHORT TERM GOAL #5   Title Patient will complete Berg Balance Assessment to further assess fall risk and areas of impairment. LTG to be set as appropriate.   Baseline met 09/07/16  54/56 with no LTG set   Status Achieved           PT Long Term Goals - 05-Nov-2016 1615      PT LONG TERM GOAL #1   Title Patient will be independent with HEP and able to verbalize appropriate exercises/machines for him to use with [facility]. (target date 10-12-16)   Time 6   Period Weeks   Status Achieved     PT LONG TERM GOAL #2   Title Patient will demonstrate gait velocity >2.62 ft/sec (indicative of safe community ambulator).  (10-12-16)   Time 6   Period Weeks   Status Not Met     PT LONG TERM GOAL #3   Title Patient will ambulate >500 ft modified independent with least restrictive device outdoors on unlevel, paved surfaces (i.e. sidewalk, parking lot).  (10-12-16)   Time 6   Period Weeks   Status Not Met               Plan - 11-05-16 1611    Clinical Impression Statement Mr. Norem agreeable to discharge from OPPT with his plan to resume working out at [employer] (as he did previously). Completed TUG (21.81 sec) and gait velocity (1.21 ft/sec) which were actually slower than last measure. Patient met one of three LTGs. Patient reports back pain improved from last visit, however overall feels worse today.    Rehab Potential Good   PT Treatment/Interventions ADLs/Self Care Home Management;DME Instruction;Gait training;Functional mobility training;Therapeutic activities;Therapeutic exercise;Balance  training;Neuromuscular re-education;Patient/family education   PT Home Exercise Plan sitting hamstring stretch, single knee to chest stretch (supine), "pelvic tilts" in sitting; bridging with theraband, bridging with marching; sidelying hip abdct with band   Consulted and Agree with Plan of Care Patient      Patient will benefit from skilled therapeutic intervention in order to improve the following deficits and impairments:  Abnormal gait, Decreased balance, Decreased knowledge of use of DME, Decreased strength, Difficulty walking, Impaired sensation  Visit Diagnosis: Other abnormalities of gait and mobility  Muscle weakness (generalized)       G-Codes - 11-05-2016 1616    Functional Assessment Tool Used (Outpatient Only) TUG, gait velocity   Functional Limitation Mobility: Walking and moving around   Mobility: Walking and Moving Around Goal Status [phone]) At least 1 percent but less than 20 percent impaired, limited or restricted   Mobility: Walking and Moving Around Discharge Status [phone]) At least 20 percent but less than 40 percent impaired, limited or restricted      Problem List Patient Active Problem List   Diagnosis Date Noted  . Gastroesophageal reflux disease without esophagitis 06/22/2016  . Constipation 06/22/2016  . Bladder cancer ([place]) 06/03/2014  . Iron deficiency anemia, unspecified 06/13/2012  . Sleep disorder 04/16/2012  . Nonsustained ventricular tachycardia ([place]) 09/17/2011  . Atypical chest pain 08/27/2011  . GOUT, ACUTE 07/16/2008  . RENAL CYST, RIGHT 07/16/2008  . TRANSAMINASES, SERUM, ELEVATED 07/16/2008  . DIABETES MELLITUS, TYPE II 04/12/2008  . PERIPHERAL EDEMA 04/12/2008  . POSTTRAUMATIC STRESS DISORDER 08/11/2007  . PERIPHERAL NEUROPATHY 08/11/2007  . HYPERTENSION 08/11/2007  . ALLERGIC RHINITIS 08/11/2007  . BENIGN PROSTATIC  HYPERTROPHY 08/11/2007  . DEGENERATIVE JOINT DISEASE 08/11/2007  . OSTEOPOROSIS 08/11/2007  . COLON CANCER, HX OF  08/11/2007  . Other postprocedural status(V45.89) 08/11/2007   PHYSICAL THERAPY DISCHARGE SUMMARY  Visits from Start of Care: 10  Current functional level related to goals / functional outcomes: Due to recent return of back pain, pt has returned to or below his baseline. He plans to continue exercises and re-join gym   Remaining deficits: Arthritis pain    Education / Equipment: See notes; no equipment  Plan: Patient agrees to discharge.  Patient goals were partially met. Patient is being discharged due to being pleased with the current functional level.  ?????        Rexanne Mano, PT 10/10/2016, 4:21 PM  [facility] [address] [place], [place], [address] Phone: [phone]   Fax:  [phone]  Name: [name] MRN: [id number] Date of Birth: 06-29-1933

## 2016-11-19 DIAGNOSIS — R351 Nocturia: Secondary | ICD-10-CM | POA: Diagnosis not present

## 2016-11-19 DIAGNOSIS — N5201 Erectile dysfunction due to arterial insufficiency: Secondary | ICD-10-CM | POA: Diagnosis not present

## 2016-11-19 DIAGNOSIS — Z8551 Personal history of malignant neoplasm of bladder: Secondary | ICD-10-CM | POA: Diagnosis not present

## 2016-11-19 DIAGNOSIS — N401 Enlarged prostate with lower urinary tract symptoms: Secondary | ICD-10-CM | POA: Diagnosis not present

## 2016-11-27 DIAGNOSIS — H47233 Glaucomatous optic atrophy, bilateral: Secondary | ICD-10-CM | POA: Diagnosis not present

## 2016-11-27 DIAGNOSIS — Z9849 Cataract extraction status, unspecified eye: Secondary | ICD-10-CM | POA: Diagnosis not present

## 2016-11-27 DIAGNOSIS — H401222 Low-tension glaucoma, left eye, moderate stage: Secondary | ICD-10-CM | POA: Diagnosis not present

## 2016-11-27 DIAGNOSIS — H401212 Low-tension glaucoma, right eye, moderate stage: Secondary | ICD-10-CM | POA: Diagnosis not present

## 2016-11-27 DIAGNOSIS — Z961 Presence of intraocular lens: Secondary | ICD-10-CM | POA: Diagnosis not present

## 2016-11-27 DIAGNOSIS — H5203 Hypermetropia, bilateral: Secondary | ICD-10-CM | POA: Diagnosis not present

## 2016-11-27 DIAGNOSIS — H524 Presbyopia: Secondary | ICD-10-CM | POA: Diagnosis not present

## 2016-11-27 DIAGNOSIS — H52223 Regular astigmatism, bilateral: Secondary | ICD-10-CM | POA: Diagnosis not present

## 2016-11-27 DIAGNOSIS — H11153 Pinguecula, bilateral: Secondary | ICD-10-CM | POA: Diagnosis not present

## 2016-11-27 DIAGNOSIS — H11423 Conjunctival edema, bilateral: Secondary | ICD-10-CM | POA: Diagnosis not present

## 2016-11-27 DIAGNOSIS — H18413 Arcus senilis, bilateral: Secondary | ICD-10-CM | POA: Diagnosis not present

## 2016-12-12 DIAGNOSIS — E119 Type 2 diabetes mellitus without complications: Secondary | ICD-10-CM | POA: Diagnosis not present

## 2016-12-12 DIAGNOSIS — I10 Essential (primary) hypertension: Secondary | ICD-10-CM | POA: Diagnosis not present

## 2016-12-19 DIAGNOSIS — E1140 Type 2 diabetes mellitus with diabetic neuropathy, unspecified: Secondary | ICD-10-CM | POA: Diagnosis not present

## 2016-12-19 DIAGNOSIS — E119 Type 2 diabetes mellitus without complications: Secondary | ICD-10-CM | POA: Diagnosis not present

## 2016-12-19 DIAGNOSIS — N289 Disorder of kidney and ureter, unspecified: Secondary | ICD-10-CM | POA: Diagnosis not present

## 2016-12-19 DIAGNOSIS — F4310 Post-traumatic stress disorder, unspecified: Secondary | ICD-10-CM | POA: Diagnosis not present

## 2016-12-26 DIAGNOSIS — N289 Disorder of kidney and ureter, unspecified: Secondary | ICD-10-CM | POA: Diagnosis not present

## 2016-12-28 DIAGNOSIS — K219 Gastro-esophageal reflux disease without esophagitis: Secondary | ICD-10-CM | POA: Diagnosis not present

## 2016-12-28 DIAGNOSIS — N179 Acute kidney failure, unspecified: Secondary | ICD-10-CM | POA: Diagnosis not present

## 2016-12-28 DIAGNOSIS — E118 Type 2 diabetes mellitus with unspecified complications: Secondary | ICD-10-CM | POA: Diagnosis not present

## 2017-03-13 DIAGNOSIS — H11423 Conjunctival edema, bilateral: Secondary | ICD-10-CM | POA: Diagnosis not present

## 2017-03-13 DIAGNOSIS — H11153 Pinguecula, bilateral: Secondary | ICD-10-CM | POA: Diagnosis not present

## 2017-03-13 DIAGNOSIS — R51 Headache: Secondary | ICD-10-CM | POA: Diagnosis not present

## 2017-03-13 DIAGNOSIS — H47233 Glaucomatous optic atrophy, bilateral: Secondary | ICD-10-CM | POA: Diagnosis not present

## 2017-03-13 DIAGNOSIS — Z9849 Cataract extraction status, unspecified eye: Secondary | ICD-10-CM | POA: Diagnosis not present

## 2017-03-13 DIAGNOSIS — H401232 Low-tension glaucoma, bilateral, moderate stage: Secondary | ICD-10-CM | POA: Diagnosis not present

## 2017-03-13 DIAGNOSIS — Z961 Presence of intraocular lens: Secondary | ICD-10-CM | POA: Diagnosis not present

## 2017-03-13 DIAGNOSIS — Z7984 Long term (current) use of oral hypoglycemic drugs: Secondary | ICD-10-CM | POA: Diagnosis not present

## 2017-03-13 DIAGNOSIS — E119 Type 2 diabetes mellitus without complications: Secondary | ICD-10-CM | POA: Diagnosis not present

## 2017-03-13 DIAGNOSIS — H18413 Arcus senilis, bilateral: Secondary | ICD-10-CM | POA: Diagnosis not present

## 2017-03-13 DIAGNOSIS — H35033 Hypertensive retinopathy, bilateral: Secondary | ICD-10-CM | POA: Diagnosis not present

## 2017-03-13 DIAGNOSIS — H04123 Dry eye syndrome of bilateral lacrimal glands: Secondary | ICD-10-CM | POA: Diagnosis not present

## 2017-03-25 DIAGNOSIS — I10 Essential (primary) hypertension: Secondary | ICD-10-CM | POA: Diagnosis not present

## 2017-03-25 DIAGNOSIS — E118 Type 2 diabetes mellitus with unspecified complications: Secondary | ICD-10-CM | POA: Diagnosis not present

## 2017-04-01 DIAGNOSIS — E119 Type 2 diabetes mellitus without complications: Secondary | ICD-10-CM | POA: Diagnosis not present

## 2017-04-01 DIAGNOSIS — E7800 Pure hypercholesterolemia, unspecified: Secondary | ICD-10-CM | POA: Diagnosis not present

## 2017-04-01 DIAGNOSIS — I10 Essential (primary) hypertension: Secondary | ICD-10-CM | POA: Diagnosis not present

## 2017-04-01 DIAGNOSIS — M25561 Pain in right knee: Secondary | ICD-10-CM | POA: Diagnosis not present

## 2017-05-20 DIAGNOSIS — Z8551 Personal history of malignant neoplasm of bladder: Secondary | ICD-10-CM | POA: Diagnosis not present

## 2017-05-20 DIAGNOSIS — N401 Enlarged prostate with lower urinary tract symptoms: Secondary | ICD-10-CM | POA: Diagnosis not present

## 2017-05-20 DIAGNOSIS — R351 Nocturia: Secondary | ICD-10-CM | POA: Diagnosis not present

## 2017-06-13 DIAGNOSIS — H401222 Low-tension glaucoma, left eye, moderate stage: Secondary | ICD-10-CM | POA: Diagnosis not present

## 2017-06-13 DIAGNOSIS — H401213 Low-tension glaucoma, right eye, severe stage: Secondary | ICD-10-CM | POA: Diagnosis not present

## 2017-06-25 DIAGNOSIS — E119 Type 2 diabetes mellitus without complications: Secondary | ICD-10-CM | POA: Diagnosis not present

## 2017-06-25 DIAGNOSIS — I10 Essential (primary) hypertension: Secondary | ICD-10-CM | POA: Diagnosis not present

## 2017-07-02 DIAGNOSIS — E7800 Pure hypercholesterolemia, unspecified: Secondary | ICD-10-CM | POA: Diagnosis not present

## 2017-07-02 DIAGNOSIS — Z0000 Encounter for general adult medical examination without abnormal findings: Secondary | ICD-10-CM | POA: Diagnosis not present

## 2017-07-02 DIAGNOSIS — Z125 Encounter for screening for malignant neoplasm of prostate: Secondary | ICD-10-CM | POA: Diagnosis not present

## 2017-07-02 DIAGNOSIS — E1142 Type 2 diabetes mellitus with diabetic polyneuropathy: Secondary | ICD-10-CM | POA: Diagnosis not present

## 2017-07-02 DIAGNOSIS — I10 Essential (primary) hypertension: Secondary | ICD-10-CM | POA: Diagnosis not present

## 2017-07-02 DIAGNOSIS — E119 Type 2 diabetes mellitus without complications: Secondary | ICD-10-CM | POA: Diagnosis not present

## 2017-07-11 DIAGNOSIS — H18413 Arcus senilis, bilateral: Secondary | ICD-10-CM | POA: Diagnosis not present

## 2017-07-11 DIAGNOSIS — H401234 Low-tension glaucoma, bilateral, indeterminate stage: Secondary | ICD-10-CM | POA: Diagnosis not present

## 2017-07-11 DIAGNOSIS — H47233 Glaucomatous optic atrophy, bilateral: Secondary | ICD-10-CM | POA: Diagnosis not present

## 2017-07-11 DIAGNOSIS — H11153 Pinguecula, bilateral: Secondary | ICD-10-CM | POA: Diagnosis not present

## 2017-07-11 DIAGNOSIS — Z961 Presence of intraocular lens: Secondary | ICD-10-CM | POA: Diagnosis not present

## 2017-08-09 ENCOUNTER — Encounter: Payer: Self-pay | Admitting: Internal Medicine

## 2017-10-01 DIAGNOSIS — M109 Gout, unspecified: Secondary | ICD-10-CM | POA: Diagnosis not present

## 2017-10-11 DIAGNOSIS — H47233 Glaucomatous optic atrophy, bilateral: Secondary | ICD-10-CM | POA: Diagnosis not present

## 2017-10-11 DIAGNOSIS — Z7984 Long term (current) use of oral hypoglycemic drugs: Secondary | ICD-10-CM | POA: Diagnosis not present

## 2017-10-11 DIAGNOSIS — E119 Type 2 diabetes mellitus without complications: Secondary | ICD-10-CM | POA: Diagnosis not present

## 2017-10-11 DIAGNOSIS — H401232 Low-tension glaucoma, bilateral, moderate stage: Secondary | ICD-10-CM | POA: Diagnosis not present

## 2017-10-11 DIAGNOSIS — H04123 Dry eye syndrome of bilateral lacrimal glands: Secondary | ICD-10-CM | POA: Diagnosis not present

## 2017-10-11 DIAGNOSIS — Z961 Presence of intraocular lens: Secondary | ICD-10-CM | POA: Diagnosis not present

## 2017-10-11 DIAGNOSIS — H18413 Arcus senilis, bilateral: Secondary | ICD-10-CM | POA: Diagnosis not present

## 2017-10-11 DIAGNOSIS — H11153 Pinguecula, bilateral: Secondary | ICD-10-CM | POA: Diagnosis not present

## 2017-12-30 DIAGNOSIS — E119 Type 2 diabetes mellitus without complications: Secondary | ICD-10-CM | POA: Diagnosis not present

## 2017-12-30 DIAGNOSIS — I10 Essential (primary) hypertension: Secondary | ICD-10-CM | POA: Diagnosis not present

## 2017-12-30 DIAGNOSIS — Z125 Encounter for screening for malignant neoplasm of prostate: Secondary | ICD-10-CM | POA: Diagnosis not present

## 2017-12-30 DIAGNOSIS — E7800 Pure hypercholesterolemia, unspecified: Secondary | ICD-10-CM | POA: Diagnosis not present

## 2017-12-30 DIAGNOSIS — E118 Type 2 diabetes mellitus with unspecified complications: Secondary | ICD-10-CM | POA: Diagnosis not present

## 2018-01-07 DIAGNOSIS — E119 Type 2 diabetes mellitus without complications: Secondary | ICD-10-CM | POA: Diagnosis not present

## 2018-01-07 DIAGNOSIS — I10 Essential (primary) hypertension: Secondary | ICD-10-CM | POA: Diagnosis not present

## 2018-01-07 DIAGNOSIS — Z0000 Encounter for general adult medical examination without abnormal findings: Secondary | ICD-10-CM | POA: Diagnosis not present

## 2018-01-07 DIAGNOSIS — G629 Polyneuropathy, unspecified: Secondary | ICD-10-CM | POA: Diagnosis not present

## 2018-01-14 DIAGNOSIS — I10 Essential (primary) hypertension: Secondary | ICD-10-CM | POA: Diagnosis not present

## 2018-01-14 DIAGNOSIS — K219 Gastro-esophageal reflux disease without esophagitis: Secondary | ICD-10-CM | POA: Diagnosis not present

## 2018-02-06 DIAGNOSIS — H04123 Dry eye syndrome of bilateral lacrimal glands: Secondary | ICD-10-CM | POA: Diagnosis not present

## 2018-02-06 DIAGNOSIS — H11159 Pinguecula, unspecified eye: Secondary | ICD-10-CM | POA: Diagnosis not present

## 2018-02-06 DIAGNOSIS — H18413 Arcus senilis, bilateral: Secondary | ICD-10-CM | POA: Diagnosis not present

## 2018-02-06 DIAGNOSIS — H401232 Low-tension glaucoma, bilateral, moderate stage: Secondary | ICD-10-CM | POA: Diagnosis not present

## 2018-02-06 DIAGNOSIS — H26493 Other secondary cataract, bilateral: Secondary | ICD-10-CM | POA: Diagnosis not present

## 2018-02-06 DIAGNOSIS — Z961 Presence of intraocular lens: Secondary | ICD-10-CM | POA: Diagnosis not present

## 2018-03-15 ENCOUNTER — Ambulatory Visit (INDEPENDENT_AMBULATORY_CARE_PROVIDER_SITE_OTHER): Payer: Medicare Other | Admitting: Sports Medicine

## 2018-03-15 ENCOUNTER — Encounter: Payer: Self-pay | Admitting: Sports Medicine

## 2018-03-15 ENCOUNTER — Ambulatory Visit (INDEPENDENT_AMBULATORY_CARE_PROVIDER_SITE_OTHER): Payer: Medicare Other

## 2018-03-15 VITALS — BP 132/50 | HR 98 | Temp 98.0°F | Resp 16

## 2018-03-15 DIAGNOSIS — M19079 Primary osteoarthritis, unspecified ankle and foot: Secondary | ICD-10-CM

## 2018-03-15 DIAGNOSIS — M79672 Pain in left foot: Secondary | ICD-10-CM | POA: Diagnosis not present

## 2018-03-15 DIAGNOSIS — M7661 Achilles tendinitis, right leg: Secondary | ICD-10-CM | POA: Diagnosis not present

## 2018-03-15 DIAGNOSIS — M7662 Achilles tendinitis, left leg: Secondary | ICD-10-CM

## 2018-03-15 DIAGNOSIS — M79671 Pain in right foot: Secondary | ICD-10-CM

## 2018-03-15 NOTE — Progress Notes (Signed)
   Subjective:    Patient ID: [name], male    DOB: 1932/08/30, 84 y.o.   MRN: [id number]  HPI    Review of Systems  All other systems reviewed and are negative.      Objective:   Physical Exam        Assessment & Plan:

## 2018-03-15 NOTE — Patient Instructions (Signed)

## 2018-03-15 NOTE — Progress Notes (Signed)
Subjective: [name] is a 84 y.o. male patient who presents to office for evaluation of bilateral heel pain. Patient complains of progressive pain especially over the last 6-8 months in both heels at the achilles. Ranks pain 9/10 and is now interferring with daily activities. Patient has tried pain meds with no relief in symptoms. Patient also admits to history of arthritis.Patient denies any other pedal complaints.   Review of Systems  Musculoskeletal: Positive for joint pain and myalgias.       Gait problem  Neurological: Positive for sensory change and weakness.  All other systems reviewed and are negative.    Patient Active Problem List   Diagnosis Date Noted  . Gastroesophageal reflux disease without esophagitis 06/22/2016  . Constipation 06/22/2016  . Bladder cancer ([place]) 06/03/2014  . Iron deficiency anemia, unspecified 06/13/2012  . Sleep disorder 04/16/2012  . Nonsustained ventricular tachycardia ([place]) 09/17/2011  . Atypical chest pain 08/27/2011  . GOUT, ACUTE 07/16/2008  . RENAL CYST, RIGHT 07/16/2008  . TRANSAMINASES, SERUM, ELEVATED 07/16/2008  . DIABETES MELLITUS, TYPE II 04/12/2008  . PERIPHERAL EDEMA 04/12/2008  . POSTTRAUMATIC STRESS DISORDER 08/11/2007  . PERIPHERAL NEUROPATHY 08/11/2007  . HYPERTENSION 08/11/2007  . ALLERGIC RHINITIS 08/11/2007  . BENIGN PROSTATIC HYPERTROPHY 08/11/2007  . DEGENERATIVE JOINT DISEASE 08/11/2007  . OSTEOPOROSIS 08/11/2007  . COLON CANCER, HX OF 08/11/2007  . Other postprocedural status(V45.89) 08/11/2007    Current Outpatient Medications on File Prior to Visit  Medication Sig Dispense Refill  . atorvastatin (LIPITOR) 40 MG tablet Take 40 mg by mouth daily.    . brimonidine (ALPHAGAN P) 0.1 % SOLN     . acetaminophen (TYLENOL) 325 MG tablet Take 650 mg by mouth every 6 (six) hours as needed for mild pain.     Marland Kitchen ammonium lactate (LAC-HYDRIN) 12 % lotion Apply 1 application topically daily as needed for dry skin.    Marland Kitchen  aspirin 81 MG tablet Take 81 mg by mouth daily as needed for pain.     Marland Kitchen atenolol (TENORMIN) 25 MG tablet Take 25 mg by mouth every morning.     . bisacodyl (DULCOLAX) 10 MG suppository Place 10 mg rectally daily as needed for moderate constipation.    . Coenzyme Q10 (CO Q 10) 100 MG CAPS Take 100 mg by mouth 2 (two) times daily.    . finasteride (PROSCAR) 5 MG tablet Take 5 mg by mouth at bedtime.    . gabapentin (NEURONTIN) 100 MG capsule Take 100 mg by mouth 3 (three) times daily.    Marland Kitchen latanoprost (XALATAN) 0.005 % ophthalmic solution Place 1 drop into the right eye at bedtime.     Marland Kitchen omega-3 acid ethyl esters (LOVAZA) 1 g capsule Take 1 g by mouth daily at 2 PM.    . omeprazole (PRILOSEC) 20 MG capsule Take 20 mg by mouth 2 (two) times daily.     Marland Kitchen omeprazole (PRILOSEC) 40 MG capsule Take 1 tablet twice daily. 180 capsule 3  . terazosin (HYTRIN) 2 MG capsule Take 2 mg by mouth at bedtime.     No current facility-administered medications on file prior to visit.     No Known Allergies  Objective:  General: Alert and oriented x3 in no acute distress  Dermatology: No open lesions bilateral lower extremities, no webspace macerations, no ecchymosis bilateral, all nails x 10 are well manicured.  Vascular: Dorsalis Pedis and Posterior Tibial pedal pulses 1/4, Capillary Fill Time 5 seconds, + scant pedal hair growth bilateral, no  edema bilateral lower extremities, Temperature gradient within normal limits.  Neurology: Gross sensation intact via light touch bilateral, subjective tingling and burning to feet.   Musculoskeletal: Mild tenderness with palpation at insertion of the Achilles on Right and Left, there is calcaneal exostosis with mild soft tissue present and decreased ankle rom with knee extending  vs flexed resembling gastroc equnius bilateral, The achilles tendon feels intact with no nodularity or palpable dell, Thompson sign negative, Subtalar and midtarsal joint range of motion is  within normal limits, there is hammering and limited midtarsal rom with dorsal spur consistent with arthritis.   Xrays  Right/Left Foot    Impression: Normal osseous mineralization. Joint spaces preserved except midtarsal joint consistent with arthritis. No fracture/dislocation/boney destruction. Calcaneal spur present. Kager's triangle intact with no obliteration. No soft tissue abnormalities or radiopaque foreign bodies.   Assessment and Plan: Problem List Items Addressed This Visit    None    Visit Diagnoses    Achilles tendonitis, bilateral    -  Primary   Foot pain, bilateral       Relevant Orders   DG Foot Complete Right   DG Foot Complete Left   Arthritis of foot         -Complete examination performed -Xrays reviewed -Discussed treatement options for tendonitis and arthritis -Rx Night splint, heel lifts, gentle stretching -Continue with meds as given by pcp -No improvement will consider MRI/PT/EPAT -Patient to return to office as needed or sooner if condition worsens.  [name], DPM

## 2018-03-15 NOTE — Progress Notes (Signed)
   Subjective:    Patient ID: [name], male    DOB: 1933-04-13, 84 y.o.   MRN: [id number]  HPI    Review of Systems  Musculoskeletal: Positive for arthralgias, gait problem, joint swelling and myalgias.  Neurological: Positive for weakness and numbness.  All other systems reviewed and are negative.      Objective:   Physical Exam        Assessment & Plan:

## 2018-03-17 ENCOUNTER — Other Ambulatory Visit: Payer: Self-pay | Admitting: Sports Medicine

## 2018-03-17 DIAGNOSIS — M7661 Achilles tendinitis, right leg: Secondary | ICD-10-CM

## 2018-03-17 DIAGNOSIS — M7662 Achilles tendinitis, left leg: Secondary | ICD-10-CM

## 2018-03-17 DIAGNOSIS — M19079 Primary osteoarthritis, unspecified ankle and foot: Secondary | ICD-10-CM

## 2018-03-17 DIAGNOSIS — M79672 Pain in left foot: Principal | ICD-10-CM

## 2018-03-17 DIAGNOSIS — M79671 Pain in right foot: Secondary | ICD-10-CM

## 2018-03-19 ENCOUNTER — Telehealth: Payer: Self-pay | Admitting: Sports Medicine

## 2018-03-19 NOTE — Telephone Encounter (Signed)
Faxed notes to [facility] 03/19/2018.

## 2018-03-21 DIAGNOSIS — Z803 Family history of malignant neoplasm of breast: Secondary | ICD-10-CM | POA: Diagnosis not present

## 2018-03-21 DIAGNOSIS — Z8042 Family history of malignant neoplasm of prostate: Secondary | ICD-10-CM | POA: Diagnosis not present

## 2018-03-21 DIAGNOSIS — Z85038 Personal history of other malignant neoplasm of large intestine: Secondary | ICD-10-CM | POA: Diagnosis not present

## 2018-03-21 DIAGNOSIS — Z8041 Family history of malignant neoplasm of ovary: Secondary | ICD-10-CM | POA: Diagnosis not present

## 2018-03-21 DIAGNOSIS — C20 Malignant neoplasm of rectum: Secondary | ICD-10-CM | POA: Diagnosis not present

## 2018-03-21 DIAGNOSIS — Z8551 Personal history of malignant neoplasm of bladder: Secondary | ICD-10-CM | POA: Diagnosis not present

## 2018-04-07 DIAGNOSIS — E119 Type 2 diabetes mellitus without complications: Secondary | ICD-10-CM | POA: Diagnosis not present

## 2018-04-07 DIAGNOSIS — K219 Gastro-esophageal reflux disease without esophagitis: Secondary | ICD-10-CM | POA: Diagnosis not present

## 2018-04-07 DIAGNOSIS — I10 Essential (primary) hypertension: Secondary | ICD-10-CM | POA: Diagnosis not present

## 2018-05-21 DIAGNOSIS — N401 Enlarged prostate with lower urinary tract symptoms: Secondary | ICD-10-CM | POA: Diagnosis not present

## 2018-05-21 DIAGNOSIS — R351 Nocturia: Secondary | ICD-10-CM | POA: Diagnosis not present

## 2018-05-21 DIAGNOSIS — Z8551 Personal history of malignant neoplasm of bladder: Secondary | ICD-10-CM | POA: Diagnosis not present

## 2018-05-21 DIAGNOSIS — N5201 Erectile dysfunction due to arterial insufficiency: Secondary | ICD-10-CM | POA: Diagnosis not present

## 2018-05-27 DIAGNOSIS — H0102A Squamous blepharitis right eye, upper and lower eyelids: Secondary | ICD-10-CM | POA: Diagnosis not present

## 2018-05-27 DIAGNOSIS — Z961 Presence of intraocular lens: Secondary | ICD-10-CM | POA: Diagnosis not present

## 2018-05-27 DIAGNOSIS — H04123 Dry eye syndrome of bilateral lacrimal glands: Secondary | ICD-10-CM | POA: Diagnosis not present

## 2018-05-27 DIAGNOSIS — H35033 Hypertensive retinopathy, bilateral: Secondary | ICD-10-CM | POA: Diagnosis not present

## 2018-05-27 DIAGNOSIS — Z9849 Cataract extraction status, unspecified eye: Secondary | ICD-10-CM | POA: Diagnosis not present

## 2018-05-27 DIAGNOSIS — H47233 Glaucomatous optic atrophy, bilateral: Secondary | ICD-10-CM | POA: Diagnosis not present

## 2018-05-27 DIAGNOSIS — H43813 Vitreous degeneration, bilateral: Secondary | ICD-10-CM | POA: Diagnosis not present

## 2018-05-27 DIAGNOSIS — H11823 Conjunctivochalasis, bilateral: Secondary | ICD-10-CM | POA: Diagnosis not present

## 2018-05-27 DIAGNOSIS — H18413 Arcus senilis, bilateral: Secondary | ICD-10-CM | POA: Diagnosis not present

## 2018-05-27 DIAGNOSIS — H11153 Pinguecula, bilateral: Secondary | ICD-10-CM | POA: Diagnosis not present

## 2018-05-27 DIAGNOSIS — H0102B Squamous blepharitis left eye, upper and lower eyelids: Secondary | ICD-10-CM | POA: Diagnosis not present

## 2018-05-27 DIAGNOSIS — H401232 Low-tension glaucoma, bilateral, moderate stage: Secondary | ICD-10-CM | POA: Diagnosis not present

## 2018-06-13 DIAGNOSIS — H18413 Arcus senilis, bilateral: Secondary | ICD-10-CM | POA: Diagnosis not present

## 2018-06-13 DIAGNOSIS — H401232 Low-tension glaucoma, bilateral, moderate stage: Secondary | ICD-10-CM | POA: Diagnosis not present

## 2018-06-13 DIAGNOSIS — H52223 Regular astigmatism, bilateral: Secondary | ICD-10-CM | POA: Diagnosis not present

## 2018-06-13 DIAGNOSIS — Z961 Presence of intraocular lens: Secondary | ICD-10-CM | POA: Diagnosis not present

## 2018-06-13 DIAGNOSIS — H5202 Hypermetropia, left eye: Secondary | ICD-10-CM | POA: Diagnosis not present

## 2018-06-13 DIAGNOSIS — H47233 Glaucomatous optic atrophy, bilateral: Secondary | ICD-10-CM | POA: Diagnosis not present

## 2018-06-13 DIAGNOSIS — H524 Presbyopia: Secondary | ICD-10-CM | POA: Diagnosis not present

## 2018-06-13 DIAGNOSIS — H11153 Pinguecula, bilateral: Secondary | ICD-10-CM | POA: Diagnosis not present

## 2018-06-13 DIAGNOSIS — H04123 Dry eye syndrome of bilateral lacrimal glands: Secondary | ICD-10-CM | POA: Diagnosis not present

## 2018-06-30 DIAGNOSIS — E119 Type 2 diabetes mellitus without complications: Secondary | ICD-10-CM | POA: Diagnosis not present

## 2018-06-30 DIAGNOSIS — I10 Essential (primary) hypertension: Secondary | ICD-10-CM | POA: Diagnosis not present

## 2018-06-30 DIAGNOSIS — E118 Type 2 diabetes mellitus with unspecified complications: Secondary | ICD-10-CM | POA: Diagnosis not present

## 2018-06-30 DIAGNOSIS — E7800 Pure hypercholesterolemia, unspecified: Secondary | ICD-10-CM | POA: Diagnosis not present

## 2018-07-07 DIAGNOSIS — F321 Major depressive disorder, single episode, moderate: Secondary | ICD-10-CM | POA: Diagnosis not present

## 2018-07-07 DIAGNOSIS — E1140 Type 2 diabetes mellitus with diabetic neuropathy, unspecified: Secondary | ICD-10-CM | POA: Diagnosis not present

## 2018-07-07 DIAGNOSIS — Z23 Encounter for immunization: Secondary | ICD-10-CM | POA: Diagnosis not present

## 2018-07-07 DIAGNOSIS — I10 Essential (primary) hypertension: Secondary | ICD-10-CM | POA: Diagnosis not present

## 2018-07-07 DIAGNOSIS — E7800 Pure hypercholesterolemia, unspecified: Secondary | ICD-10-CM | POA: Diagnosis not present

## 2018-07-28 DIAGNOSIS — R05 Cough: Secondary | ICD-10-CM | POA: Diagnosis not present

## 2018-07-28 DIAGNOSIS — R6889 Other general symptoms and signs: Secondary | ICD-10-CM | POA: Diagnosis not present

## 2018-07-31 DIAGNOSIS — J988 Other specified respiratory disorders: Secondary | ICD-10-CM | POA: Diagnosis not present

## 2018-08-26 ENCOUNTER — Ambulatory Visit (INDEPENDENT_AMBULATORY_CARE_PROVIDER_SITE_OTHER): Payer: Medicare PPO | Admitting: Sports Medicine

## 2018-08-26 ENCOUNTER — Telehealth: Payer: Self-pay | Admitting: Sports Medicine

## 2018-08-26 ENCOUNTER — Encounter: Payer: Self-pay | Admitting: Sports Medicine

## 2018-08-26 DIAGNOSIS — M2041 Other hammer toe(s) (acquired), right foot: Secondary | ICD-10-CM

## 2018-08-26 DIAGNOSIS — M79674 Pain in right toe(s): Secondary | ICD-10-CM

## 2018-08-26 DIAGNOSIS — M2142 Flat foot [pes planus] (acquired), left foot: Secondary | ICD-10-CM

## 2018-08-26 DIAGNOSIS — B351 Tinea unguium: Secondary | ICD-10-CM | POA: Diagnosis not present

## 2018-08-26 DIAGNOSIS — M2042 Other hammer toe(s) (acquired), left foot: Secondary | ICD-10-CM

## 2018-08-26 DIAGNOSIS — E1142 Type 2 diabetes mellitus with diabetic polyneuropathy: Secondary | ICD-10-CM | POA: Diagnosis not present

## 2018-08-26 DIAGNOSIS — M79675 Pain in left toe(s): Secondary | ICD-10-CM | POA: Diagnosis not present

## 2018-08-26 DIAGNOSIS — M2141 Flat foot [pes planus] (acquired), right foot: Secondary | ICD-10-CM

## 2018-08-26 NOTE — Telephone Encounter (Signed)
[facility] could send [facility] to [facility]. He states he goes to [facility] in [place] and I told him I would be faxing over the RX and office note to the prosthetic dept so they could get shoes for the pt.

## 2018-08-26 NOTE — Progress Notes (Signed)
Subjective: [name] is a 85 y.o. male patient with history of diabetes who presents to office today complaining of long,mildly painful nails while ambulating in shoes; unable to trim. Patient states that the glucose reading this morning was not recorded but last A1c was 6.6. Patient denies any new changes in medication or new problems. Reports that his achilles pain is better. Patient denies any new cramping, numbness, burning or tingling in the legs. Reports that his last visit to PCP was last Month with Drs Jeneen Rinks and Octavia Bruckner at [facility]. Patient requests diabetic shoes. No other issues noted.   Patient Active Problem List   Diagnosis Date Noted  . Gastroesophageal reflux disease without esophagitis 06/22/2016  . Constipation 06/22/2016  . Bladder cancer ([place]) 06/03/2014  . Iron deficiency anemia, unspecified 06/13/2012  . Sleep disorder 04/16/2012  . Nonsustained ventricular tachycardia ([place]) 09/17/2011  . Atypical chest pain 08/27/2011  . GOUT, ACUTE 07/16/2008  . RENAL CYST, RIGHT 07/16/2008  . TRANSAMINASES, SERUM, ELEVATED 07/16/2008  . DIABETES MELLITUS, TYPE II 04/12/2008  . PERIPHERAL EDEMA 04/12/2008  . POSTTRAUMATIC STRESS DISORDER 08/11/2007  . PERIPHERAL NEUROPATHY 08/11/2007  . HYPERTENSION 08/11/2007  . ALLERGIC RHINITIS 08/11/2007  . BENIGN PROSTATIC HYPERTROPHY 08/11/2007  . DEGENERATIVE JOINT DISEASE 08/11/2007  . OSTEOPOROSIS 08/11/2007  . COLON CANCER, HX OF 08/11/2007  . Other postprocedural status(V45.89) 08/11/2007   Current Outpatient Medications on File Prior to Visit  Medication Sig Dispense Refill  . hydrochlorothiazide (HYDRODIURIL) 25 MG tablet Take 25 mg by mouth daily.    Marland Kitchen lisinopril (PRINIVIL,ZESTRIL) 40 MG tablet Take 40 mg by mouth daily.    Marland Kitchen acetaminophen (TYLENOL) 325 MG tablet Take 650 mg by mouth every 6 (six) hours as needed for mild pain.     Marland Kitchen ammonium lactate (LAC-HYDRIN) 12 % lotion Apply 1 application topically daily as needed for dry skin.     Marland Kitchen aspirin 81 MG tablet Take 81 mg by mouth daily as needed for pain.     Marland Kitchen atenolol (TENORMIN) 25 MG tablet Take 25 mg by mouth every morning.     Marland Kitchen atorvastatin (LIPITOR) 40 MG tablet Take 40 mg by mouth daily.    . bisacodyl (DULCOLAX) 10 MG suppository Place 10 mg rectally daily as needed for moderate constipation.    . brimonidine (ALPHAGAN P) 0.1 % SOLN     . Coenzyme Q10 (CO Q 10) 100 MG CAPS Take 100 mg by mouth 2 (two) times daily.    . finasteride (PROSCAR) 5 MG tablet Take 5 mg by mouth at bedtime.    . gabapentin (NEURONTIN) 100 MG capsule Take 100 mg by mouth 3 (three) times daily.    Marland Kitchen latanoprost (XALATAN) 0.005 % ophthalmic solution Place 1 drop into the right eye at bedtime.     . metFORMIN (GLUCOPHAGE) 500 MG tablet Take by mouth.    . omega-3 acid ethyl esters (LOVAZA) 1 g capsule Take 1 g by mouth daily at 2 PM.    . omeprazole (PRILOSEC) 20 MG capsule Take 20 mg by mouth 2 (two) times daily.     Marland Kitchen omeprazole (PRILOSEC) 40 MG capsule Take 1 tablet twice daily. (Patient not taking: Reported on 08/26/2018) 180 capsule 3  . terazosin (HYTRIN) 2 MG capsule Take 2 mg by mouth at bedtime.     No current facility-administered medications on file prior to visit.    No Known Allergies  No results found for this or any previous visit (from the past 2160 hour(s)).  Objective: General: Patient is awake, alert, and oriented x 3 and in no acute distress.  Integument: Skin is warm, dry and supple bilateral. Nails are tender, long, thickened and  dystrophic with subungual debris, consistent with onychomycosis, 1-5 bilateral. No signs of infection. No open lesions or preulcerative lesions present bilateral. Remaining integument unremarkable.  Vasculature:  Dorsalis Pedis pulse 1/4 bilateral. Posterior Tibial pulse  1/4 bilateral.  Capillary fill time <3 sec 1-5 bilateral. Scant hair growth to the level of the digits. Temperature gradient within normal limits. No varicosities present  bilateral. No edema present bilateral.   Neurology: The patient has intact sensation measured with a 5.07/10g Semmes Weinstein Monofilament at all pedal sites bilateral . Vibratory sensation diminished bilateral with tuning fork. No Babinski sign present bilateral.   Musculoskeletal:  Asymptomatic hammertoes and pes planus pedal deformities noted bilateral. Muscular strength 5/5 in all lower extremity muscular groups bilateral without pain on range of motion, no recurrent achilles pain. No tenderness with calf compression bilateral.  Assessment and Plan: Problem List Items Addressed This Visit    None    Visit Diagnoses    Pain due to onychomycosis of toenails of both feet    -  Primary   Diabetic polyneuropathy associated with type 2 diabetes mellitus (HCC)       Relevant Medications   metFORMIN (GLUCOPHAGE) 500 MG tablet   lisinopril (PRINIVIL,ZESTRIL) 40 MG tablet   Other Relevant Orders   DME Other see comment   Hammer toes of both feet       Relevant Orders   DME Other see comment   Pes planus of both feet       Relevant Orders   DME Other see comment      -Examined patient. -Discussed and educated patient on diabetic foot care, especially with regards to the vascular, neurological and musculoskeletal systems.  -Stressed the importance of good glycemic control and the detriment of not controlling glucose levels in relation to the foot. -Mechanically debrided all nails 1-5 bilateral using sterile nail nipper and filed with dremel without incident  -Rx for Diabetic shoes and insoles written;  Office to arrange shoe fitting and dispensing. -Answered all patient questions -Patient to return  in 3 months for at risk foot care -Patient advised to call the office if any problems or questions arise in the meantime.  [name], DPM

## 2018-08-27 DIAGNOSIS — H04123 Dry eye syndrome of bilateral lacrimal glands: Secondary | ICD-10-CM | POA: Diagnosis not present

## 2018-08-27 DIAGNOSIS — H16223 Keratoconjunctivitis sicca, not specified as Sjogren's, bilateral: Secondary | ICD-10-CM | POA: Diagnosis not present

## 2018-08-27 DIAGNOSIS — H11153 Pinguecula, bilateral: Secondary | ICD-10-CM | POA: Diagnosis not present

## 2018-08-27 DIAGNOSIS — H0102A Squamous blepharitis right eye, upper and lower eyelids: Secondary | ICD-10-CM | POA: Diagnosis not present

## 2018-08-27 DIAGNOSIS — H401231 Low-tension glaucoma, bilateral, mild stage: Secondary | ICD-10-CM | POA: Diagnosis not present

## 2018-08-27 DIAGNOSIS — H0102B Squamous blepharitis left eye, upper and lower eyelids: Secondary | ICD-10-CM | POA: Diagnosis not present

## 2018-08-27 DIAGNOSIS — H18413 Arcus senilis, bilateral: Secondary | ICD-10-CM | POA: Diagnosis not present

## 2018-08-27 DIAGNOSIS — Z961 Presence of intraocular lens: Secondary | ICD-10-CM | POA: Diagnosis not present

## 2018-10-08 DIAGNOSIS — H401231 Low-tension glaucoma, bilateral, mild stage: Secondary | ICD-10-CM | POA: Diagnosis not present

## 2018-10-08 DIAGNOSIS — H0102A Squamous blepharitis right eye, upper and lower eyelids: Secondary | ICD-10-CM | POA: Diagnosis not present

## 2018-10-08 DIAGNOSIS — H0102B Squamous blepharitis left eye, upper and lower eyelids: Secondary | ICD-10-CM | POA: Diagnosis not present

## 2018-10-08 DIAGNOSIS — H04123 Dry eye syndrome of bilateral lacrimal glands: Secondary | ICD-10-CM | POA: Diagnosis not present

## 2018-10-08 DIAGNOSIS — Z961 Presence of intraocular lens: Secondary | ICD-10-CM | POA: Diagnosis not present

## 2018-11-24 ENCOUNTER — Telehealth: Payer: Self-pay | Admitting: Sports Medicine

## 2018-11-24 NOTE — Telephone Encounter (Signed)
Yes he should discuss with his PCP until he can be seen in our office for this Thanks Dr. Cannon Kettle

## 2018-11-24 NOTE — Telephone Encounter (Signed)
I called pt and offered him an appt because Dr. Cannon Kettle had not seen him for gout. Pt states he can call is PCP for some medication and I told him that would be best.

## 2018-11-24 NOTE — Telephone Encounter (Signed)
Pt wants to know if he can have pain medication for bilat Gout. Stated the has never had it filled w/ our office. R/S Dr Cannon Kettle for mid May.

## 2018-11-25 ENCOUNTER — Ambulatory Visit: Payer: Non-veteran care | Admitting: Sports Medicine

## 2018-12-30 ENCOUNTER — Encounter: Payer: Self-pay | Admitting: Sports Medicine

## 2018-12-30 ENCOUNTER — Other Ambulatory Visit: Payer: Self-pay

## 2018-12-30 ENCOUNTER — Ambulatory Visit (INDEPENDENT_AMBULATORY_CARE_PROVIDER_SITE_OTHER): Payer: Non-veteran care | Admitting: Sports Medicine

## 2018-12-30 VITALS — Temp 96.6°F

## 2018-12-30 DIAGNOSIS — B351 Tinea unguium: Secondary | ICD-10-CM | POA: Diagnosis not present

## 2018-12-30 DIAGNOSIS — E1142 Type 2 diabetes mellitus with diabetic polyneuropathy: Secondary | ICD-10-CM

## 2018-12-30 DIAGNOSIS — M79674 Pain in right toe(s): Secondary | ICD-10-CM

## 2018-12-30 DIAGNOSIS — M79675 Pain in left toe(s): Secondary | ICD-10-CM

## 2018-12-30 NOTE — Progress Notes (Signed)
Subjective: [name] is a 85 y.o. male patient with history of diabetes who presents to office today complaining of long,mildly painful nails while ambulating in shoes; unable to trim. Patient states that the glucose reading this morning was not recorded but last A1c was 6.4 to 6.1 range. Patient denies any new changes in medication or new problems. Reports that he is getting over gout and nerve problems and has his PCP treating this. Reports  diabetic shoes are good since he got them. No other issues noted.   Patient Active Problem List   Diagnosis Date Noted  . Gastroesophageal reflux disease without esophagitis 06/22/2016  . Constipation 06/22/2016  . Bladder cancer ([place]) 06/03/2014  . Iron deficiency anemia, unspecified 06/13/2012  . Sleep disorder 04/16/2012  . Nonsustained ventricular tachycardia ([place]) 09/17/2011  . Atypical chest pain 08/27/2011  . GOUT, ACUTE 07/16/2008  . RENAL CYST, RIGHT 07/16/2008  . TRANSAMINASES, SERUM, ELEVATED 07/16/2008  . DIABETES MELLITUS, TYPE II 04/12/2008  . PERIPHERAL EDEMA 04/12/2008  . POSTTRAUMATIC STRESS DISORDER 08/11/2007  . PERIPHERAL NEUROPATHY 08/11/2007  . HYPERTENSION 08/11/2007  . ALLERGIC RHINITIS 08/11/2007  . BENIGN PROSTATIC HYPERTROPHY 08/11/2007  . DEGENERATIVE JOINT DISEASE 08/11/2007  . OSTEOPOROSIS 08/11/2007  . COLON CANCER, HX OF 08/11/2007  . Other postprocedural status(V45.89) 08/11/2007   Current Outpatient Medications on File Prior to Visit  Medication Sig Dispense Refill  . acetaminophen (TYLENOL) 325 MG tablet Take 650 mg by mouth every 6 (six) hours as needed for mild pain.     Marland Kitchen allopurinol (ZYLOPRIM) 100 MG tablet     . ammonium lactate (LAC-HYDRIN) 12 % lotion Apply 1 application topically daily as needed for dry skin.    Marland Kitchen aspirin 81 MG tablet Take 81 mg by mouth daily as needed for pain.     Marland Kitchen atenolol (TENORMIN) 25 MG tablet Take 25 mg by mouth every morning.     Marland Kitchen atorvastatin (LIPITOR) 40 MG tablet  Take 40 mg by mouth daily.    Marland Kitchen azithromycin (ZITHROMAX) 500 MG tablet Take 500 mg by mouth daily.    . bisacodyl (DULCOLAX) 10 MG suppository Place 10 mg rectally daily as needed for moderate constipation.    . brimonidine (ALPHAGAN P) 0.1 % SOLN     . brimonidine (ALPHAGAN) 0.2 % ophthalmic solution     . Coenzyme Q10 (CO Q 10) 100 MG CAPS Take 100 mg by mouth 2 (two) times daily.    . Colchicine 0.6 MG CAPS TAKE 1 CAPSULE BY MOUTH ONCE DAILY FOR 30 DAYS    . finasteride (PROSCAR) 5 MG tablet Take 5 mg by mouth at bedtime.    . gabapentin (NEURONTIN) 100 MG capsule Take 100 mg by mouth 3 (three) times daily.    Marland Kitchen gabapentin (NEURONTIN) 300 MG capsule     . hydrochlorothiazide (HYDRODIURIL) 25 MG tablet Take 25 mg by mouth daily.    Marland Kitchen latanoprost (XALATAN) 0.005 % ophthalmic solution Place 1 drop into the right eye at bedtime.     Marland Kitchen lisinopril (PRINIVIL,ZESTRIL) 40 MG tablet Take 40 mg by mouth daily.    . metFORMIN (GLUCOPHAGE) 500 MG tablet Take by mouth.    . omega-3 acid ethyl esters (LOVAZA) 1 g capsule Take 1 g by mouth daily at 2 PM.    . omeprazole (PRILOSEC) 20 MG capsule Take 20 mg by mouth 2 (two) times daily.     Marland Kitchen omeprazole (PRILOSEC) 40 MG capsule Take 1 tablet twice daily. 180 capsule 3  .  predniSONE (DELTASONE) 10 MG tablet TAKE 6 TABLETS ON DAY 1 THEN TAKE 5 TABLETS ON DAY 2 THEN TAKE 4 TABLETS ON DAY 3 THEN TAKE 3 TABLETS ON DAY 4 THEN TAKE 2 TABLETS ON DAY 5    . tamsulosin (FLOMAX) 0.4 MG CAPS capsule     . terazosin (HYTRIN) 2 MG capsule Take 2 mg by mouth at bedtime.     No current facility-administered medications on file prior to visit.    No Known Allergies  No results found for this or any previous visit (from the past 2160 hour(s)).  Objective: General: Patient is awake, alert, and oriented x 3 and in no acute distress.  Integument: Skin is warm, dry and supple bilateral. Nails are tender, long, thickened and dystrophic with subungual debris, consistent with  onychomycosis, 1-5 bilateral. No signs of infection. No open lesions or preulcerative lesions present bilateral. Remaining integument unremarkable.  Vasculature:  Dorsalis Pedis pulse 1/4 bilateral. Posterior Tibial pulse  1/4 bilateral. Capillary fill time <3 sec 1-5 bilateral. Scant hair growth to the level of the digits.Temperature gradient within normal limits. No varicosities present bilateral. No edema present bilateral.   Neurology: The patient has intact sensation measured with a 5.07/10g Semmes Weinstein Monofilament at all pedal sites bilateral . Vibratory sensation diminished bilateral with tuning fork. No Babinski sign present bilateral.   Musculoskeletal:  Asymptomatic hammertoes and pes planus pedal deformities noted bilateral. Muscular strength 5/5 in all lower extremity muscular groups bilateral without pain on range of motion, no recurrent achilles pain. No tenderness with calf compression bilateral.  Assessment and Plan: Problem List Items Addressed This Visit    None    Visit Diagnoses    Pain due to onychomycosis of toenails of both feet    -  Primary   Relevant Medications   azithromycin (ZITHROMAX) 500 MG tablet   Diabetic polyneuropathy associated with type 2 diabetes mellitus (HCC)       Relevant Medications   gabapentin (NEURONTIN) 300 MG capsule      -Examined patient. -Discussed and educated patient on diabetic foot care, especially with regards to the vascular, neurological and musculoskeletal systems.  -Mechanically debrided all nails 1-5 bilateral using sterile nail nipper and filed with dremel without incident  -Continue with diabetic shoes -Continue with PCP mgt of gout/neuropathy  -Answered all patient questions -Patient to return  in 3 months for at risk foot care -Patient advised to call the office if any problems or questions arise in the meantime.  [name], DPM

## 2019-01-02 DIAGNOSIS — H401231 Low-tension glaucoma, bilateral, mild stage: Secondary | ICD-10-CM | POA: Diagnosis not present

## 2019-01-06 DIAGNOSIS — E1140 Type 2 diabetes mellitus with diabetic neuropathy, unspecified: Secondary | ICD-10-CM | POA: Diagnosis not present

## 2019-01-06 DIAGNOSIS — M1009 Idiopathic gout, multiple sites: Secondary | ICD-10-CM | POA: Diagnosis not present

## 2019-01-06 DIAGNOSIS — Z0000 Encounter for general adult medical examination without abnormal findings: Secondary | ICD-10-CM | POA: Diagnosis not present

## 2019-01-06 DIAGNOSIS — I10 Essential (primary) hypertension: Secondary | ICD-10-CM | POA: Diagnosis not present

## 2019-01-06 DIAGNOSIS — N289 Disorder of kidney and ureter, unspecified: Secondary | ICD-10-CM | POA: Diagnosis not present

## 2019-01-06 DIAGNOSIS — E7800 Pure hypercholesterolemia, unspecified: Secondary | ICD-10-CM | POA: Diagnosis not present

## 2019-01-12 DIAGNOSIS — Z8551 Personal history of malignant neoplasm of bladder: Secondary | ICD-10-CM | POA: Diagnosis not present

## 2019-01-12 DIAGNOSIS — K219 Gastro-esophageal reflux disease without esophagitis: Secondary | ICD-10-CM | POA: Diagnosis not present

## 2019-01-12 DIAGNOSIS — Z7189 Other specified counseling: Secondary | ICD-10-CM | POA: Diagnosis not present

## 2019-01-12 DIAGNOSIS — I10 Essential (primary) hypertension: Secondary | ICD-10-CM | POA: Diagnosis not present

## 2019-01-12 DIAGNOSIS — G629 Polyneuropathy, unspecified: Secondary | ICD-10-CM | POA: Diagnosis not present

## 2019-01-12 DIAGNOSIS — E1140 Type 2 diabetes mellitus with diabetic neuropathy, unspecified: Secondary | ICD-10-CM | POA: Diagnosis not present

## 2019-01-12 DIAGNOSIS — Z23 Encounter for immunization: Secondary | ICD-10-CM | POA: Diagnosis not present

## 2019-01-12 DIAGNOSIS — Z85038 Personal history of other malignant neoplasm of large intestine: Secondary | ICD-10-CM | POA: Diagnosis not present

## 2019-01-12 DIAGNOSIS — M1A09X0 Idiopathic chronic gout, multiple sites, without tophus (tophi): Secondary | ICD-10-CM | POA: Diagnosis not present

## 2019-01-14 DIAGNOSIS — H401231 Low-tension glaucoma, bilateral, mild stage: Secondary | ICD-10-CM | POA: Diagnosis not present

## 2019-01-14 DIAGNOSIS — H04123 Dry eye syndrome of bilateral lacrimal glands: Secondary | ICD-10-CM | POA: Diagnosis not present

## 2019-01-14 DIAGNOSIS — H0102A Squamous blepharitis right eye, upper and lower eyelids: Secondary | ICD-10-CM | POA: Diagnosis not present

## 2019-01-14 DIAGNOSIS — Z961 Presence of intraocular lens: Secondary | ICD-10-CM | POA: Diagnosis not present

## 2019-01-14 DIAGNOSIS — H0102B Squamous blepharitis left eye, upper and lower eyelids: Secondary | ICD-10-CM | POA: Diagnosis not present

## 2019-02-18 DIAGNOSIS — H401231 Low-tension glaucoma, bilateral, mild stage: Secondary | ICD-10-CM | POA: Diagnosis not present

## 2019-03-06 DIAGNOSIS — H401231 Low-tension glaucoma, bilateral, mild stage: Secondary | ICD-10-CM | POA: Diagnosis not present

## 2019-04-07 ENCOUNTER — Encounter: Payer: Self-pay | Admitting: Sports Medicine

## 2019-04-07 ENCOUNTER — Ambulatory Visit (INDEPENDENT_AMBULATORY_CARE_PROVIDER_SITE_OTHER): Payer: Non-veteran care | Admitting: Sports Medicine

## 2019-04-07 ENCOUNTER — Other Ambulatory Visit: Payer: Self-pay

## 2019-04-07 VITALS — Temp 98.2°F

## 2019-04-07 DIAGNOSIS — E1140 Type 2 diabetes mellitus with diabetic neuropathy, unspecified: Secondary | ICD-10-CM | POA: Diagnosis not present

## 2019-04-07 DIAGNOSIS — I10 Essential (primary) hypertension: Secondary | ICD-10-CM | POA: Diagnosis not present

## 2019-04-07 DIAGNOSIS — M79674 Pain in right toe(s): Secondary | ICD-10-CM | POA: Diagnosis not present

## 2019-04-07 DIAGNOSIS — M79675 Pain in left toe(s): Secondary | ICD-10-CM

## 2019-04-07 DIAGNOSIS — B351 Tinea unguium: Secondary | ICD-10-CM

## 2019-04-07 DIAGNOSIS — E1142 Type 2 diabetes mellitus with diabetic polyneuropathy: Secondary | ICD-10-CM | POA: Diagnosis not present

## 2019-04-07 NOTE — Progress Notes (Signed)
Subjective: [name] is a 85 y.o. male patient with history of diabetes who presents to office today complaining of long,mildly painful nails while ambulating in shoes; unable to trim. Patient states that the glucose reading this morning was not recorded but last A1c was 6.4  And last results from blood work he brought with him to today's visit. No other issues noted.   Patient Active Problem List   Diagnosis Date Noted  . Gastroesophageal reflux disease without esophagitis 06/22/2016  . Constipation 06/22/2016  . Bladder cancer ([place]) 06/03/2014  . Iron deficiency anemia, unspecified 06/13/2012  . Sleep disorder 04/16/2012  . Nonsustained ventricular tachycardia ([place]) 09/17/2011  . Atypical chest pain 08/27/2011  . GOUT, ACUTE 07/16/2008  . RENAL CYST, RIGHT 07/16/2008  . TRANSAMINASES, SERUM, ELEVATED 07/16/2008  . DIABETES MELLITUS, TYPE II 04/12/2008  . PERIPHERAL EDEMA 04/12/2008  . POSTTRAUMATIC STRESS DISORDER 08/11/2007  . PERIPHERAL NEUROPATHY 08/11/2007  . HYPERTENSION 08/11/2007  . ALLERGIC RHINITIS 08/11/2007  . BENIGN PROSTATIC HYPERTROPHY 08/11/2007  . DEGENERATIVE JOINT DISEASE 08/11/2007  . OSTEOPOROSIS 08/11/2007  . COLON CANCER, HX OF 08/11/2007  . Other postprocedural status(V45.89) 08/11/2007   Current Outpatient Medications on File Prior to Visit  Medication Sig Dispense Refill  . acetaminophen (TYLENOL) 325 MG tablet Take 650 mg by mouth every 6 (six) hours as needed for mild pain.     Marland Kitchen allopurinol (ZYLOPRIM) 100 MG tablet     . amLODipine (NORVASC) 5 MG tablet     . ammonium lactate (LAC-HYDRIN) 12 % lotion Apply 1 application topically daily as needed for dry skin.    Marland Kitchen aspirin 81 MG tablet Take 81 mg by mouth daily as needed for pain.     Marland Kitchen atenolol (TENORMIN) 25 MG tablet Take 25 mg by mouth every morning.     Marland Kitchen atorvastatin (LIPITOR) 40 MG tablet Take 40 mg by mouth daily.    Marland Kitchen azithromycin (ZITHROMAX) 500 MG tablet Take 500 mg by mouth daily.     . bisacodyl (DULCOLAX) 10 MG suppository Place 10 mg rectally daily as needed for moderate constipation.    . brimonidine (ALPHAGAN P) 0.1 % SOLN     . brimonidine (ALPHAGAN) 0.2 % ophthalmic solution     . Coenzyme Q10 (CO Q 10) 100 MG CAPS Take 100 mg by mouth 2 (two) times daily.    . Colchicine 0.6 MG CAPS TAKE 1 CAPSULE BY MOUTH ONCE DAILY FOR 30 DAYS    . finasteride (PROSCAR) 5 MG tablet Take 5 mg by mouth at bedtime.    . gabapentin (NEURONTIN) 100 MG capsule Take 100 mg by mouth 3 (three) times daily.    Marland Kitchen gabapentin (NEURONTIN) 300 MG capsule     . hydrochlorothiazide (HYDRODIURIL) 25 MG tablet Take 25 mg by mouth daily.    Marland Kitchen latanoprost (XALATAN) 0.005 % ophthalmic solution Place 1 drop into the right eye at bedtime.     Marland Kitchen lisinopril (PRINIVIL,ZESTRIL) 40 MG tablet Take 40 mg by mouth daily.    . metFORMIN (GLUCOPHAGE) 500 MG tablet Take by mouth.    . omega-3 acid ethyl esters (LOVAZA) 1 g capsule Take 1 g by mouth daily at 2 PM.    . omeprazole (PRILOSEC) 20 MG capsule Take 20 mg by mouth 2 (two) times daily.     Marland Kitchen omeprazole (PRILOSEC) 40 MG capsule Take 1 tablet twice daily. 180 capsule 3  . predniSONE (DELTASONE) 10 MG tablet TAKE 6 TABLETS ON DAY 1 THEN TAKE 5  TABLETS ON DAY 2 THEN TAKE 4 TABLETS ON DAY 3 THEN TAKE 3 TABLETS ON DAY 4 THEN TAKE 2 TABLETS ON DAY 5    . tamsulosin (FLOMAX) 0.4 MG CAPS capsule     . terazosin (HYTRIN) 2 MG capsule Take 2 mg by mouth at bedtime.     No current facility-administered medications on file prior to visit.    No Known Allergies  No results found for this or any previous visit (from the past 2160 hour(s)).  Objective: General: Patient is awake, alert, and oriented x 3 and in no acute distress.  Integument: Skin is warm, dry and supple bilateral. Nails are tender, long, thickened and dystrophic with subungual debris, consistent with onychomycosis, 1-5 bilateral. No signs of infection. No open lesions or preulcerative lesions present  bilateral. Remaining integument unremarkable.  Vasculature:  Dorsalis Pedis pulse 1/4 bilateral. Posterior Tibial pulse  1/4 bilateral. Capillary fill time <3 sec 1-5 bilateral. Scant hair growth to the level of the digits.Temperature gradient within normal limits. No varicosities present bilateral. No edema present bilateral.   Neurology: The patient has intact sensation measured with a 5.07/10g Semmes Weinstein Monofilament at all pedal sites bilateral . Vibratory sensation diminished bilateral with tuning fork. No Babinski sign present bilateral.   Musculoskeletal:  Asymptomatic hammertoes and pes planus pedal deformities noted bilateral. Muscular strength 5/5 in all lower extremity muscular groups bilateral without pain on range of motion, no recurrent achilles pain. No tenderness with calf compression bilateral.  Assessment and Plan: Problem List Items Addressed This Visit    None    Visit Diagnoses    Pain due to onychomycosis of toenails of both feet    -  Primary   Diabetic polyneuropathy associated with type 2 diabetes mellitus ([place])          -Examined patient. -Discussed and educated patient on diabetic foot care, especially with regards to the vascular, neurological and musculoskeletal systems.  -Mechanically debrided all nails 1-5 bilateral using sterile nail nipper and filed with dremel without incident  -Continue with diabetic shoes -Continue with PCP mgt of gout/neuropathy; previous labs reviewed  -Answered all patient questions -Patient to return  in 3 months for at risk foot care -Patient advised to call the office if any problems or questions arise in the meantime.  [name], DPM

## 2019-04-14 DIAGNOSIS — I10 Essential (primary) hypertension: Secondary | ICD-10-CM | POA: Diagnosis not present

## 2019-04-14 DIAGNOSIS — E7800 Pure hypercholesterolemia, unspecified: Secondary | ICD-10-CM | POA: Diagnosis not present

## 2019-04-14 DIAGNOSIS — M1A09X0 Idiopathic chronic gout, multiple sites, without tophus (tophi): Secondary | ICD-10-CM | POA: Diagnosis not present

## 2019-04-14 DIAGNOSIS — E1140 Type 2 diabetes mellitus with diabetic neuropathy, unspecified: Secondary | ICD-10-CM | POA: Diagnosis not present

## 2019-05-25 DIAGNOSIS — R351 Nocturia: Secondary | ICD-10-CM | POA: Diagnosis not present

## 2019-05-25 DIAGNOSIS — N401 Enlarged prostate with lower urinary tract symptoms: Secondary | ICD-10-CM | POA: Diagnosis not present

## 2019-05-25 DIAGNOSIS — Z8551 Personal history of malignant neoplasm of bladder: Secondary | ICD-10-CM | POA: Diagnosis not present

## 2019-06-11 DIAGNOSIS — R311 Benign essential microscopic hematuria: Secondary | ICD-10-CM | POA: Diagnosis not present

## 2019-06-11 DIAGNOSIS — C679 Malignant neoplasm of bladder, unspecified: Secondary | ICD-10-CM | POA: Diagnosis not present

## 2019-06-15 DIAGNOSIS — R311 Benign essential microscopic hematuria: Secondary | ICD-10-CM | POA: Diagnosis not present

## 2019-06-15 DIAGNOSIS — Z8551 Personal history of malignant neoplasm of bladder: Secondary | ICD-10-CM | POA: Diagnosis not present

## 2019-06-15 DIAGNOSIS — R8289 Other abnormal findings on cytological and histological examination of urine: Secondary | ICD-10-CM | POA: Diagnosis not present

## 2019-07-02 ENCOUNTER — Other Ambulatory Visit: Payer: Self-pay

## 2019-07-02 ENCOUNTER — Ambulatory Visit (INDEPENDENT_AMBULATORY_CARE_PROVIDER_SITE_OTHER): Payer: Medicare PPO | Admitting: Otolaryngology

## 2019-07-02 ENCOUNTER — Encounter (INDEPENDENT_AMBULATORY_CARE_PROVIDER_SITE_OTHER): Payer: Self-pay | Admitting: Otolaryngology

## 2019-07-02 VITALS — Temp 96.8°F

## 2019-07-02 DIAGNOSIS — H903 Sensorineural hearing loss, bilateral: Secondary | ICD-10-CM | POA: Diagnosis not present

## 2019-07-02 DIAGNOSIS — J310 Chronic rhinitis: Secondary | ICD-10-CM | POA: Diagnosis not present

## 2019-07-02 MED ORDER — FLUTICASONE PROPIONATE 50 MCG/ACT NA SUSP: 2.0000 | Freq: Every day | NASAL | 6 refills | Status: DC

## 2019-07-02 MED ORDER — AZELASTINE HCL 0.1 % NA SOLN: 1.0000 | Freq: Two times a day (BID) | NASAL | 12 refills | Status: AC

## 2019-07-02 MED ORDER — FLUTICASONE PROPIONATE 50 MCG/ACT NA SUSP: 2.0000 | Freq: Every day | NASAL | 6 refills | Status: AC

## 2019-07-02 NOTE — Progress Notes (Signed)
HPI: [name] is an 85 y.o. male who presents for evaluation of sinus drainage. Patient states that his sinuses have felt like they have been draining more for the last six months. He notes consistent nasal drainage that does not come out of his nose but instead goes down the back of his throat. This has led to chest congestion and cough. Patient states it also has led to him swallowing the drainage which led to an infection in his GI tract treated with Cipro. He states that the drainage is always clear. Patient states he has difficulty breathing through his nose and he has felt like his balance has been off due to the congestion. He denies any spinning sensation. He has been using Afrin with moderate benefit. He denies any throat pain, colored drainage, fever. 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 He wears bilateral hearing aids with benefit.  Past Medical History:  Diagnosis Date  . Allergic rhinitis   . Anemia   . Anxiety   . Bladder tumor    WAS HAVING BLOOD IN URINE - CLEARED BUT OCCAS CLOT NOW  . BPH (benign prostatic hypertrophy)    FREQUENT URINATION   . Cancer ([place])   . Cataract    bilateral extrraction and lens implants '88-,'89  . Depression   . Diverticulosis   . DJD (degenerative joint disease)   . DM2 (diabetes mellitus, type 2) ([place])   . Dyspepsia   . GERD (gastroesophageal reflux disease)   . Glaucoma   . Glaucoma   . Headache   . Hearing loss    bilateral  . History of colon cancer   . Hypertension   . Multiple pulmonary nodules    SEEN ON CT CHEST DONE 05/25/14 AT [facility]  . Nonsustained ventricular tachycardia ([facility])    PAST HISTORY  . Osteoporosis   . Peripheral neuropathy   . Posttraumatic stress disorder   . Restless leg syndrome   . Tubular adenoma 02/17/2008   Past Surgical History:  Procedure Laterality Date  . COLON SURGERY     for colon cancer march 2005  . COLONOSCOPY    .  CYSTO N/A 06/03/2014   Procedure: CYSTO;  Surgeon: [name], MD;  Location: [facility];  Service: Urology;  Laterality: N/A;  . CYSTOSCOPY WITH URETEROSCOPY Bilateral 07/26/2015   Procedure: CYSTO, BILATERAL RENAL WASHING WITH    URETEROSCOPY, TUR PROSTATE BIOPSY;  Surgeon: [name], MD;  Location: [facility];  Service: Urology;  Laterality: Bilateral;  . EYE SURGERY     bilateral cataract surgery   . INNER EAR SURGERY     excision of tumor, then two surgeries: prosthesis, then had to remove.  '10  . JOINT REPLACEMENT    . KNEE ARTHROSCOPY    . PROSTATE BIOPSY N/A 07/26/2015   Procedure: PROSTATE BIOPSY;  Surgeon: [name], MD;  Location: [facility];  Service: Urology;  Laterality: N/A;  . TOTAL KNEE ARTHROPLASTY     right  . TRANSURETHRAL RESECTION OF BLADDER TUMOR N/A 06/24/2014   Procedure: TRANSURETHRAL RESECTION OF BLADDER TUMOR (TURBT);  Surgeon: [name], MD;  Location: [facility];  Service: Urology;  Laterality: N/A;  . TRANSURETHRAL RESECTION OF BLADDER TUMOR WITH GYRUS (TURBT-GYRUS) N/A 06/03/2014   Procedure: TRANSURETHRAL RESECTION OF BLADDER TUMOR WITH GYRUS (TURBT-GYRUS);  Surgeon: [name], MD;  Location: [facility];  Service: Urology;  Laterality: N/A;  . TRANSURETHRAL RESECTION OF PROSTATE N/A  07/26/2015   Procedure: BLADDER TRANSURETHRAL RESECTION ;  Surgeon: [name], MD;  Location: [facility];  Service: Urology;  Laterality: N/A;   Social History   Socioeconomic History  . Marital status: Married    Spouse name: Not on file  . Number of children: 0  . Years of education: Not on file  . Highest education level: Not on file  Occupational History  . Occupation: retired  Scientific laboratory technician  . Financial resource strain: Not on file  . Food insecurity    Worry: Not on file    Inability: Not on file  . Transportation needs    Medical: Not on file    Non-medical: Not on file  Tobacco Use  . Smoking status: Former Smoker    Packs/day: 0.75    Years: 30.00    Pack years:  22.50    Start date: 1949    Quit date: 1979    Years since quitting: 41.9  . Smokeless tobacco: Never Used  Substance and Sexual Activity  . Alcohol use: No    Comment: occasional  . Drug use: No  . Sexual activity: Not on file  Lifestyle  . Physical activity    Days per week: Not on file    Minutes per session: Not on file  . Stress: Not on file  Relationships  . Social Herbalist on phone: Not on file    Gets together: Not on file    Attends religious service: Not on file    Active member of club or organization: Not on file    Attends meetings of clubs or organizations: Not on file    Relationship status: Not on file  Other Topics Concern  . Not on file  Social History Narrative   HSG.  Married '61- '73, divorced; married '79.  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  1 adopted son- from [place], 2 step-sons. Disability with multiple military related problems. Did work for the [employer] until 1993.       Lives in [place].   Family History  Problem Relation Age of Onset  . Breast cancer Mother        ?  . Diabetes Mother   . Kidney disease Mother   . Lung cancer Father   . Pancreatic cancer Sister   . Prostate cancer Brother   . Cancer Brother   . Breast cancer Sister        x 2  . Cancer Son    No Known Allergies Prior to Admission medications   Medication Sig Start Date End Date Taking? Authorizing Provider  acetaminophen (TYLENOL) 325 MG tablet Take 650 mg by mouth every 6 (six) hours as needed for mild pain.    Yes [provider]  allopurinol (ZYLOPRIM) 100 MG tablet  12/22/18  Yes [provider]  amLODipine (NORVASC) 5 MG tablet  03/03/19  Yes [provider]  ammonium lactate (LAC-HYDRIN) 12 % lotion Apply 1 application topically daily as needed for dry skin.   Yes [provider]  aspirin 81 MG tablet Take 81 mg by mouth daily as needed for pain.    Yes [provider]  atenolol (TENORMIN) 25 MG tablet Take 25  mg by mouth every morning.    Yes [provider]  atorvastatin (LIPITOR) 40 MG tablet Take 40 mg by mouth daily.   Yes [provider]  azithromycin (ZITHROMAX) 500 MG tablet Take 500 mg by mouth daily. 07/28/18  Yes [provider]  bisacodyl (DULCOLAX) 10 MG suppository Place 10 mg rectally daily as needed for moderate constipation.   Yes [provider]  brimonidine (ALPHAGAN P) 0.1 % SOLN    Yes [provider]  brimonidine (ALPHAGAN) 0.2 % ophthalmic solution  11/22/18  Yes [provider]  Coenzyme Q10 (CO Q 10) 100 MG CAPS Take 100 mg by mouth 2 (two) times daily.   Yes [provider]  Colchicine 0.6 MG CAPS TAKE 1 CAPSULE BY MOUTH ONCE DAILY FOR 30 DAYS 12/11/18  Yes [provider]  finasteride (PROSCAR) 5 MG tablet Take 5 mg by mouth at bedtime.   Yes [provider]  gabapentin (NEURONTIN) 100 MG capsule Take 100 mg by mouth 3 (three) times daily.   Yes [provider]  gabapentin (NEURONTIN) 300 MG capsule  09/18/18  Yes [provider]  hydrochlorothiazide (HYDRODIURIL) 25 MG tablet Take 25 mg by mouth daily.   Yes [provider]  latanoprost (XALATAN) 0.005 % ophthalmic solution Place 1 drop into the right eye at bedtime.    Yes [provider]  lisinopril (PRINIVIL,ZESTRIL) 40 MG tablet Take 40 mg by mouth daily.   Yes [provider]  metFORMIN (GLUCOPHAGE) 500 MG tablet Take by mouth.   Yes [provider]  omega-3 acid ethyl esters (LOVAZA) 1 g capsule Take 1 g by mouth daily at 2 PM.   Yes [provider]  omeprazole (PRILOSEC) 20 MG capsule Take 20 mg by mouth 2 (two) times daily.    Yes [provider]  omeprazole (PRILOSEC) 40 MG capsule Take 1 tablet twice daily. 06/22/16  Yes Zehr, Laban Emperor, PA-C  predniSONE (DELTASONE) 10 MG tablet TAKE 6 TABLETS ON DAY 1 THEN TAKE 5 TABLETS ON DAY 2 THEN TAKE 4 TABLETS ON DAY 3 THEN TAKE 3  TABLETS ON DAY 4 THEN TAKE 2 TABLETS ON DAY 5 11/06/18  Yes [provider]  tamsulosin (FLOMAX) 0.4 MG CAPS capsule  10/22/18  Yes [provider]  terazosin (HYTRIN) 2 MG capsule Take 2 mg by mouth at bedtime.   Yes [provider]  azelastine (ASTELIN) 0.1 % nasal spray Place 1 spray into both nostrils 2 (two) times daily. Use in each nostril as directed 07/02/19   Rozetta Nunnery, MD  fluticasone [facility]) 50 MCG/ACT nasal spray Place 2 sprays into both nostrils at bedtime. 07/02/19   Rozetta Nunnery, MD     Positive ROS: otherwise negative  All other systems have been reviewed and were otherwise negative with the exception of those mentioned in the HPI and as above.  Physical Exam: Constitutional: Alert, well-appearing, no acute distress Ears: External ears without lesions or tenderness. Ear canals with mild wax build up bilaterally but otherwise clear EACs and intact, clear TMs. Tuning forks reveal subjectively decreased hearing bilaterally Nasal: External nose without lesions. Septum deviates mildly to right. Moderate mucosal edema. Middle meatus with clear drainage bilaterally but no polyps or masses. Oral: Lips and gums without lesions. Tongue and palate mucosa without lesions. Posterior oropharynx clear. Neck: No palpable adenopathy or masses Respiratory: Breathing comfortably  Skin: No facial/neck lesions or rash noted.  Assessment: Chronic rhinitis Rebound congestion with long-term Afrin use SNHL with vestibular weakness and unspecified dizziness  Plan: Recommend patient use Flonase nasal spray two sprays each nostril and saline rinses throughout the day. Discussed with patient rebound congestion and recommended patient discontinue the Afrin nasal spray. Also gave patient prescription for Astelin, one  spray each nostril BID for drainage. If patient develops colored drainage or fever, he should call for re-assessment.  Discussed with patient  dizziness likely due to vestibular weakness. He has PT appointment scheduled through the [place]. He will follow up PRN.  Christin Hoffstadt, PA-C  I agree with the assessment and plan as outlined above. Radene Journey, MD

## 2019-07-06 DIAGNOSIS — H11153 Pinguecula, bilateral: Secondary | ICD-10-CM | POA: Diagnosis not present

## 2019-07-06 DIAGNOSIS — H16223 Keratoconjunctivitis sicca, not specified as Sjogren's, bilateral: Secondary | ICD-10-CM | POA: Diagnosis not present

## 2019-07-06 DIAGNOSIS — H0102B Squamous blepharitis left eye, upper and lower eyelids: Secondary | ICD-10-CM | POA: Diagnosis not present

## 2019-07-06 DIAGNOSIS — H18413 Arcus senilis, bilateral: Secondary | ICD-10-CM | POA: Diagnosis not present

## 2019-07-06 DIAGNOSIS — H0102A Squamous blepharitis right eye, upper and lower eyelids: Secondary | ICD-10-CM | POA: Diagnosis not present

## 2019-07-06 DIAGNOSIS — H04123 Dry eye syndrome of bilateral lacrimal glands: Secondary | ICD-10-CM | POA: Diagnosis not present

## 2019-07-06 DIAGNOSIS — Z961 Presence of intraocular lens: Secondary | ICD-10-CM | POA: Diagnosis not present

## 2019-07-06 DIAGNOSIS — H401231 Low-tension glaucoma, bilateral, mild stage: Secondary | ICD-10-CM | POA: Diagnosis not present

## 2019-07-07 ENCOUNTER — Encounter: Payer: Self-pay | Admitting: Sports Medicine

## 2019-07-07 ENCOUNTER — Ambulatory Visit (INDEPENDENT_AMBULATORY_CARE_PROVIDER_SITE_OTHER): Payer: Medicare PPO | Admitting: Sports Medicine

## 2019-07-07 ENCOUNTER — Other Ambulatory Visit: Payer: Self-pay

## 2019-07-07 DIAGNOSIS — M79674 Pain in right toe(s): Secondary | ICD-10-CM | POA: Diagnosis not present

## 2019-07-07 DIAGNOSIS — M79675 Pain in left toe(s): Secondary | ICD-10-CM

## 2019-07-07 DIAGNOSIS — E1142 Type 2 diabetes mellitus with diabetic polyneuropathy: Secondary | ICD-10-CM | POA: Diagnosis not present

## 2019-07-07 DIAGNOSIS — B351 Tinea unguium: Secondary | ICD-10-CM | POA: Diagnosis not present

## 2019-07-07 NOTE — Progress Notes (Signed)
Subjective: NARAIN WAUGAMAN is a 85 y.o. male patient with history of diabetes who presents to office today complaining of long,mildly painful nails while ambulating in shoes; unable to trim. Patient states that the glucose reading this morning was not recorded but last A1c was 6.4 like before. No other issues noted.   Patient Active Problem List   Diagnosis Date Noted  . Gastroesophageal reflux disease without esophagitis 06/22/2016  . Constipation 06/22/2016  . Bladder cancer ([place]) 06/03/2014  . Iron deficiency anemia, unspecified 06/13/2012  . Sleep disorder 04/16/2012  . Nonsustained ventricular tachycardia ([place]) 09/17/2011  . Atypical chest pain 08/27/2011  . GOUT, ACUTE 07/16/2008  . RENAL CYST, RIGHT 07/16/2008  . TRANSAMINASES, SERUM, ELEVATED 07/16/2008  . DIABETES MELLITUS, TYPE II 04/12/2008  . PERIPHERAL EDEMA 04/12/2008  . POSTTRAUMATIC STRESS DISORDER 08/11/2007  . PERIPHERAL NEUROPATHY 08/11/2007  . HYPERTENSION 08/11/2007  . ALLERGIC RHINITIS 08/11/2007  . BENIGN PROSTATIC HYPERTROPHY 08/11/2007  . DEGENERATIVE JOINT DISEASE 08/11/2007  . OSTEOPOROSIS 08/11/2007  . COLON CANCER, HX OF 08/11/2007  . Other postprocedural status(V45.89) 08/11/2007   Current Outpatient Medications on File Prior to Visit  Medication Sig Dispense Refill  . acetaminophen (TYLENOL) 325 MG tablet Take 650 mg by mouth every 6 (six) hours as needed for mild pain.     Marland Kitchen allopurinol (ZYLOPRIM) 100 MG tablet     . amLODipine (NORVASC) 5 MG tablet     . ammonium lactate (LAC-HYDRIN) 12 % lotion Apply 1 application topically daily as needed for dry skin.    Marland Kitchen aspirin 81 MG tablet Take 81 mg by mouth daily as needed for pain.     Marland Kitchen atenolol (TENORMIN) 25 MG tablet Take 25 mg by mouth every morning.     Marland Kitchen atorvastatin (LIPITOR) 40 MG tablet Take 40 mg by mouth daily.    Marland Kitchen azelastine (ASTELIN) 0.1 % nasal spray Place 1 spray into both nostrils 2 (two) times daily. Use in each nostril as directed 30  mL 12  . azithromycin (ZITHROMAX) 500 MG tablet Take 500 mg by mouth daily.    . bisacodyl (DULCOLAX) 10 MG suppository Place 10 mg rectally daily as needed for moderate constipation.    . brimonidine (ALPHAGAN P) 0.1 % SOLN     . brimonidine (ALPHAGAN) 0.2 % ophthalmic solution     . Coenzyme Q10 (CO Q 10) 100 MG CAPS Take 100 mg by mouth 2 (two) times daily.    . Colchicine 0.6 MG CAPS TAKE 1 CAPSULE BY MOUTH ONCE DAILY FOR 30 DAYS    . finasteride (PROSCAR) 5 MG tablet Take 5 mg by mouth at bedtime.    . fluticasone (FLONASE) 50 MCG/ACT nasal spray Place 2 sprays into both nostrils at bedtime. 16 g 6  . gabapentin (NEURONTIN) 100 MG capsule Take 100 mg by mouth 3 (three) times daily.    Marland Kitchen gabapentin (NEURONTIN) 300 MG capsule     . hydrochlorothiazide (HYDRODIURIL) 25 MG tablet Take 25 mg by mouth daily.    Marland Kitchen latanoprost (XALATAN) 0.005 % ophthalmic solution Place 1 drop into the right eye at bedtime.     Marland Kitchen lisinopril (PRINIVIL,ZESTRIL) 40 MG tablet Take 40 mg by mouth daily.    . metFORMIN (GLUCOPHAGE) 500 MG tablet Take by mouth.    . omega-3 acid ethyl esters (LOVAZA) 1 g capsule Take 1 g by mouth daily at 2 PM.    . omeprazole (PRILOSEC) 20 MG capsule Take 20 mg by mouth 2 (two) times daily.     Marland Kitchen  omeprazole (PRILOSEC) 40 MG capsule Take 1 tablet twice daily. 180 capsule 3  . predniSONE (DELTASONE) 10 MG tablet TAKE 6 TABLETS ON DAY 1 THEN TAKE 5 TABLETS ON DAY 2 THEN TAKE 4 TABLETS ON DAY 3 THEN TAKE 3 TABLETS ON DAY 4 THEN TAKE 2 TABLETS ON DAY 5    . tamsulosin (FLOMAX) 0.4 MG CAPS capsule     . terazosin (HYTRIN) 2 MG capsule Take 2 mg by mouth at bedtime.     No current facility-administered medications on file prior to visit.    No Known Allergies  No results found for this or any previous visit (from the past 2160 hour(s)).  Objective: General: Patient is awake, alert, and oriented x 3 and in no acute distress.  Integument: Skin is warm, dry and supple bilateral. Nails  are tender, long, thickened and dystrophic with subungual debris, consistent with onychomycosis, 1-5 bilateral. No signs of infection. No open lesions or preulcerative lesions present bilateral. Remaining integument unremarkable.  Vasculature:  Dorsalis Pedis pulse 1/4 bilateral. Posterior Tibial pulse  1/4 bilateral. Capillary fill time <3 sec 1-5 bilateral. Scant hair growth to the level of the digits.Temperature gradient within normal limits. No varicosities present bilateral. No edema present bilateral.   Neurology: The patient has intact sensation measured with a 5.07/10g Semmes Weinstein Monofilament at all pedal sites bilateral . Vibratory sensation diminished bilateral with tuning fork. No Babinski sign present bilateral.   Musculoskeletal:  Asymptomatic hammertoes and pes planus pedal deformities noted bilateral. Muscular strength 5/5 in all lower extremity muscular groups bilateral without pain on range of motion, no recurrent achilles pain. No tenderness with calf compression bilateral.  Assessment and Plan: Problem List Items Addressed This Visit    None    Visit Diagnoses    Pain due to onychomycosis of toenails of both feet    -  Primary   Diabetic polyneuropathy associated with type 2 diabetes mellitus ([place])          -Examined patient. -Re-discussed and educated patient on diabetic foot care, especially with regards to the vascular, neurological and musculoskeletal systems.  -Mechanically debrided all nails 1-5 bilateral using sterile nail nipper and filed with dremel without incident  -Continue with diabetic shoes  -Answered all patient questions -Patient to return  in 3 months for at risk foot care -Patient advised to call the office if any problems or questions arise in the meantime.  [name], DPM

## 2019-07-14 DIAGNOSIS — E1140 Type 2 diabetes mellitus with diabetic neuropathy, unspecified: Secondary | ICD-10-CM | POA: Diagnosis not present

## 2019-07-14 DIAGNOSIS — I10 Essential (primary) hypertension: Secondary | ICD-10-CM | POA: Diagnosis not present

## 2019-07-20 DIAGNOSIS — Z8551 Personal history of malignant neoplasm of bladder: Secondary | ICD-10-CM | POA: Diagnosis not present

## 2019-07-20 DIAGNOSIS — Z23 Encounter for immunization: Secondary | ICD-10-CM | POA: Diagnosis not present

## 2019-07-20 DIAGNOSIS — E7800 Pure hypercholesterolemia, unspecified: Secondary | ICD-10-CM | POA: Diagnosis not present

## 2019-07-20 DIAGNOSIS — I10 Essential (primary) hypertension: Secondary | ICD-10-CM | POA: Diagnosis not present

## 2019-07-20 DIAGNOSIS — E1140 Type 2 diabetes mellitus with diabetic neuropathy, unspecified: Secondary | ICD-10-CM | POA: Diagnosis not present

## 2019-09-29 ENCOUNTER — Other Ambulatory Visit: Payer: Self-pay

## 2019-09-29 ENCOUNTER — Ambulatory Visit (INDEPENDENT_AMBULATORY_CARE_PROVIDER_SITE_OTHER): Payer: Non-veteran care | Admitting: Sports Medicine

## 2019-09-29 ENCOUNTER — Encounter: Payer: Self-pay | Admitting: Sports Medicine

## 2019-09-29 DIAGNOSIS — B351 Tinea unguium: Secondary | ICD-10-CM

## 2019-09-29 DIAGNOSIS — E1142 Type 2 diabetes mellitus with diabetic polyneuropathy: Secondary | ICD-10-CM | POA: Diagnosis not present

## 2019-09-29 DIAGNOSIS — M79674 Pain in right toe(s): Secondary | ICD-10-CM

## 2019-09-29 DIAGNOSIS — M79675 Pain in left toe(s): Secondary | ICD-10-CM

## 2019-09-29 NOTE — Progress Notes (Signed)
Subjective: [name] is a 86 y.o. male patient with history of diabetes who returns to office today complaining of long,mildly painful nails while ambulating in shoes; unable to trim. Patient states that the glucose reading this morning was not recorded but last A1c was 6.4 like before and will see PCP next week for more bloodwork. Reports that is still working on [place] honoring his mental disorder. No other issues noted.   Patient Active Problem List   Diagnosis Date Noted  . Gastroesophageal reflux disease without esophagitis 06/22/2016  . Constipation 06/22/2016  . Bladder cancer ([place]) 06/03/2014  . Iron deficiency anemia, unspecified 06/13/2012  . Sleep disorder 04/16/2012  . Nonsustained ventricular tachycardia ([place]) 09/17/2011  . Atypical chest pain 08/27/2011  . GOUT, ACUTE 07/16/2008  . RENAL CYST, RIGHT 07/16/2008  . TRANSAMINASES, SERUM, ELEVATED 07/16/2008  . DIABETES MELLITUS, TYPE II 04/12/2008  . PERIPHERAL EDEMA 04/12/2008  . POSTTRAUMATIC STRESS DISORDER 08/11/2007  . PERIPHERAL NEUROPATHY 08/11/2007  . HYPERTENSION 08/11/2007  . ALLERGIC RHINITIS 08/11/2007  . BENIGN PROSTATIC HYPERTROPHY 08/11/2007  . DEGENERATIVE JOINT DISEASE 08/11/2007  . OSTEOPOROSIS 08/11/2007  . COLON CANCER, HX OF 08/11/2007  . Other postprocedural status(V45.89) 08/11/2007   Current Outpatient Medications on File Prior to Visit  Medication Sig Dispense Refill  . acetaminophen (TYLENOL) 325 MG tablet Take 650 mg by mouth every 6 (six) hours as needed for mild pain.     Marland Kitchen allopurinol (ZYLOPRIM) 100 MG tablet     . amLODipine (NORVASC) 5 MG tablet     . ammonium lactate (LAC-HYDRIN) 12 % lotion Apply 1 application topically daily as needed for dry skin.    Marland Kitchen aspirin 81 MG tablet Take 81 mg by mouth daily as needed for pain.     Marland Kitchen atenolol (TENORMIN) 25 MG tablet Take 25 mg by mouth every morning.     Marland Kitchen atorvastatin (LIPITOR) 40 MG tablet Take 40 mg by mouth daily.    Marland Kitchen azelastine  (ASTELIN) 0.1 % nasal spray Place 1 spray into both nostrils 2 (two) times daily. Use in each nostril as directed 30 mL 12  . azithromycin (ZITHROMAX) 500 MG tablet Take 500 mg by mouth daily.    Marland Kitchen BETASEPT SURGICAL SCRUB 4 % external liquid     . bisacodyl (DULCOLAX) 10 MG suppository Place 10 mg rectally daily as needed for moderate constipation.    . brimonidine (ALPHAGAN P) 0.1 % SOLN     . brimonidine (ALPHAGAN) 0.2 % ophthalmic solution     . Coenzyme Q10 (CO Q 10) 100 MG CAPS Take 100 mg by mouth 2 (two) times daily.    . colchicine 0.6 MG tablet     . finasteride (PROSCAR) 5 MG tablet Take 5 mg by mouth at bedtime.    . fluticasone (FLONASE) 50 MCG/ACT nasal spray Place 2 sprays into both nostrils at bedtime. 16 g 6  . gabapentin (NEURONTIN) 100 MG capsule Take 100 mg by mouth 3 (three) times daily.    Marland Kitchen gabapentin (NEURONTIN) 300 MG capsule     . hydrochlorothiazide (HYDRODIURIL) 25 MG tablet Take 25 mg by mouth daily.    Marland Kitchen latanoprost (XALATAN) 0.005 % ophthalmic solution Place 1 drop into the right eye at bedtime.     Marland Kitchen lisinopril (PRINIVIL,ZESTRIL) 40 MG tablet Take 40 mg by mouth daily.    . metFORMIN (GLUCOPHAGE) 500 MG tablet Take by mouth.    . omega-3 acid ethyl esters (LOVAZA) 1 g capsule Take 1 g by  mouth daily at 2 PM.    . omeprazole (PRILOSEC) 20 MG capsule Take 20 mg by mouth 2 (two) times daily.     Marland Kitchen omeprazole (PRILOSEC) 40 MG capsule Take 1 tablet twice daily. 180 capsule 3  . predniSONE (DELTASONE) 10 MG tablet TAKE 6 TABLETS ON DAY 1 THEN TAKE 5 TABLETS ON DAY 2 THEN TAKE 4 TABLETS ON DAY 3 THEN TAKE 3 TABLETS ON DAY 4 THEN TAKE 2 TABLETS ON DAY 5    . tamsulosin (FLOMAX) 0.4 MG CAPS capsule     . terazosin (HYTRIN) 2 MG capsule Take 2 mg by mouth at bedtime.     No current facility-administered medications on file prior to visit.   No Known Allergies  No results found for this or any previous visit (from the past 2160 hour(s)).  Objective: General: Patient  is awake, alert, and oriented x 3 and in no acute distress.  Integument: Skin is warm, dry and supple bilateral. Nails are tender, long, thickened and dystrophic with subungual debris, consistent with onychomycosis, 1-5 bilateral. No signs of infection. No open lesions or preulcerative lesions present bilateral, minimal callus sub met 5 bilateral. Remaining integument unremarkable.  Vasculature:  Dorsalis Pedis pulse 1/4 bilateral. Posterior Tibial pulse  1/4 bilateral. Capillary fill time <3 sec 1-5 bilateral. Scant hair growth to the level of the digits.Temperature gradient within normal limits. No varicosities present bilateral. No edema present bilateral.   Neurology: The patient has intact sensation measured with a 5.07/10g Semmes Weinstein Monofilament at all pedal sites bilateral . Vibratory sensation diminished bilateral with tuning fork. No Babinski sign present bilateral.   Musculoskeletal:  Asymptomatic hammertoes and pes planus pedal deformities noted bilateral. Muscular strength 5/5 in all lower extremity muscular groups bilateral without pain on range of motion, no recurrent achilles pain. No tenderness with calf compression bilateral.  Assessment and Plan: Problem List Items Addressed This Visit    None    Visit Diagnoses    Pain due to onychomycosis of toenails of both feet    -  Primary   Diabetic polyneuropathy associated with type 2 diabetes mellitus ([place])          -Examined patient. -Re-discussed and educated patient on diabetic foot care, especially with regards to the vascular, neurological and musculoskeletal systems.  -Mechanically debrided all nails 1-5 bilateral using sterile nail nipper and filed with dremel without incident  -At no charge debrided callus sub met 5 bilateral using chisel blade without incident  -Continue with diabetic shoes  -Answered all patient questions -Patient to return  in 3 months for at risk foot care -Patient advised to call the office  if any problems or questions arise in the meantime.  [name], DPM

## 2019-09-30 DIAGNOSIS — H18413 Arcus senilis, bilateral: Secondary | ICD-10-CM | POA: Diagnosis not present

## 2019-09-30 DIAGNOSIS — H0102B Squamous blepharitis left eye, upper and lower eyelids: Secondary | ICD-10-CM | POA: Diagnosis not present

## 2019-09-30 DIAGNOSIS — H0102A Squamous blepharitis right eye, upper and lower eyelids: Secondary | ICD-10-CM | POA: Diagnosis not present

## 2019-09-30 DIAGNOSIS — H04123 Dry eye syndrome of bilateral lacrimal glands: Secondary | ICD-10-CM | POA: Diagnosis not present

## 2019-09-30 DIAGNOSIS — H16223 Keratoconjunctivitis sicca, not specified as Sjogren's, bilateral: Secondary | ICD-10-CM | POA: Diagnosis not present

## 2019-09-30 DIAGNOSIS — Z961 Presence of intraocular lens: Secondary | ICD-10-CM | POA: Diagnosis not present

## 2019-09-30 DIAGNOSIS — H11153 Pinguecula, bilateral: Secondary | ICD-10-CM | POA: Diagnosis not present

## 2019-09-30 DIAGNOSIS — H401231 Low-tension glaucoma, bilateral, mild stage: Secondary | ICD-10-CM | POA: Diagnosis not present

## 2019-11-18 DIAGNOSIS — Z79899 Other long term (current) drug therapy: Secondary | ICD-10-CM | POA: Diagnosis not present

## 2019-11-18 DIAGNOSIS — N289 Disorder of kidney and ureter, unspecified: Secondary | ICD-10-CM | POA: Diagnosis not present

## 2019-11-18 DIAGNOSIS — E1140 Type 2 diabetes mellitus with diabetic neuropathy, unspecified: Secondary | ICD-10-CM | POA: Diagnosis not present

## 2019-11-18 DIAGNOSIS — G629 Polyneuropathy, unspecified: Secondary | ICD-10-CM | POA: Diagnosis not present

## 2019-11-18 DIAGNOSIS — I10 Essential (primary) hypertension: Secondary | ICD-10-CM | POA: Diagnosis not present

## 2019-11-18 DIAGNOSIS — D649 Anemia, unspecified: Secondary | ICD-10-CM | POA: Diagnosis not present

## 2019-11-25 DIAGNOSIS — E1140 Type 2 diabetes mellitus with diabetic neuropathy, unspecified: Secondary | ICD-10-CM | POA: Diagnosis not present

## 2019-11-25 DIAGNOSIS — I10 Essential (primary) hypertension: Secondary | ICD-10-CM | POA: Diagnosis not present

## 2019-11-25 DIAGNOSIS — N289 Disorder of kidney and ureter, unspecified: Secondary | ICD-10-CM | POA: Diagnosis not present

## 2019-11-25 DIAGNOSIS — K219 Gastro-esophageal reflux disease without esophagitis: Secondary | ICD-10-CM | POA: Diagnosis not present

## 2019-12-29 ENCOUNTER — Other Ambulatory Visit: Payer: Self-pay

## 2019-12-29 ENCOUNTER — Encounter: Payer: Self-pay | Admitting: Sports Medicine

## 2019-12-29 ENCOUNTER — Ambulatory Visit (INDEPENDENT_AMBULATORY_CARE_PROVIDER_SITE_OTHER): Payer: Non-veteran care | Admitting: Sports Medicine

## 2019-12-29 DIAGNOSIS — M79675 Pain in left toe(s): Secondary | ICD-10-CM | POA: Diagnosis not present

## 2019-12-29 DIAGNOSIS — E1142 Type 2 diabetes mellitus with diabetic polyneuropathy: Secondary | ICD-10-CM

## 2019-12-29 DIAGNOSIS — M79674 Pain in right toe(s): Secondary | ICD-10-CM

## 2019-12-29 DIAGNOSIS — B351 Tinea unguium: Secondary | ICD-10-CM

## 2019-12-29 NOTE — Progress Notes (Signed)
Subjective: [name] is a 86 y.o. male patient with history of diabetes who returns to office today complaining of long,mildly painful nails while ambulating in shoes; unable to trim. Patient states that the glucose reading this morning was not recorded but last week was 140 when he went to the doctor.  No other issues noted.   Patient Active Problem List   Diagnosis Date Noted  . Gastroesophageal reflux disease without esophagitis 06/22/2016  . Constipation 06/22/2016  . Bladder cancer ([place]) 06/03/2014  . Iron deficiency anemia, unspecified 06/13/2012  . Sleep disorder 04/16/2012  . Nonsustained ventricular tachycardia ([place]) 09/17/2011  . Atypical chest pain 08/27/2011  . GOUT, ACUTE 07/16/2008  . RENAL CYST, RIGHT 07/16/2008  . TRANSAMINASES, SERUM, ELEVATED 07/16/2008  . DIABETES MELLITUS, TYPE II 04/12/2008  . PERIPHERAL EDEMA 04/12/2008  . POSTTRAUMATIC STRESS DISORDER 08/11/2007  . PERIPHERAL NEUROPATHY 08/11/2007  . HYPERTENSION 08/11/2007  . ALLERGIC RHINITIS 08/11/2007  . BENIGN PROSTATIC HYPERTROPHY 08/11/2007  . DEGENERATIVE JOINT DISEASE 08/11/2007  . OSTEOPOROSIS 08/11/2007  . COLON CANCER, HX OF 08/11/2007  . Other postprocedural status(V45.89) 08/11/2007   Current Outpatient Medications on File Prior to Visit  Medication Sig Dispense Refill  . acetaminophen (TYLENOL) 325 MG tablet Take 650 mg by mouth every 6 (six) hours as needed for mild pain.     Marland Kitchen allopurinol (ZYLOPRIM) 100 MG tablet     . amLODipine (NORVASC) 5 MG tablet     . ammonium lactate (LAC-HYDRIN) 12 % lotion Apply 1 application topically daily as needed for dry skin.    Marland Kitchen aspirin 81 MG tablet Take 81 mg by mouth daily as needed for pain.     Marland Kitchen atenolol (TENORMIN) 25 MG tablet Take 25 mg by mouth every morning.     Marland Kitchen atorvastatin (LIPITOR) 40 MG tablet Take 40 mg by mouth daily.    Marland Kitchen azelastine (ASTELIN) 0.1 % nasal spray Place 1 spray into both nostrils 2 (two) times daily. Use in each nostril  as directed 30 mL 12  . azithromycin (ZITHROMAX) 500 MG tablet Take 500 mg by mouth daily.    Marland Kitchen BETASEPT SURGICAL SCRUB 4 % external liquid     . bisacodyl (DULCOLAX) 10 MG suppository Place 10 mg rectally daily as needed for moderate constipation.    . brimonidine (ALPHAGAN P) 0.1 % SOLN     . brimonidine (ALPHAGAN) 0.2 % ophthalmic solution     . Coenzyme Q10 (CO Q 10) 100 MG CAPS Take 100 mg by mouth 2 (two) times daily.    . colchicine 0.6 MG tablet     . finasteride (PROSCAR) 5 MG tablet Take 5 mg by mouth at bedtime.    . fluticasone (FLONASE) 50 MCG/ACT nasal spray Place 2 sprays into both nostrils at bedtime. 16 g 6  . gabapentin (NEURONTIN) 100 MG capsule Take 100 mg by mouth 3 (three) times daily.    Marland Kitchen gabapentin (NEURONTIN) 300 MG capsule     . hydrochlorothiazide (HYDRODIURIL) 25 MG tablet Take 25 mg by mouth daily.    Marland Kitchen latanoprost (XALATAN) 0.005 % ophthalmic solution Place 1 drop into the right eye at bedtime.     Marland Kitchen lisinopril (PRINIVIL,ZESTRIL) 40 MG tablet Take 40 mg by mouth daily.    . metFORMIN (GLUCOPHAGE) 500 MG tablet Take by mouth.    . omega-3 acid ethyl esters (LOVAZA) 1 g capsule Take 1 g by mouth daily at 2 PM.    . omeprazole (PRILOSEC) 20 MG capsule Take  20 mg by mouth 2 (two) times daily.     Marland Kitchen omeprazole (PRILOSEC) 40 MG capsule Take 1 tablet twice daily. 180 capsule 3  . predniSONE (DELTASONE) 10 MG tablet TAKE 6 TABLETS ON DAY 1 THEN TAKE 5 TABLETS ON DAY 2 THEN TAKE 4 TABLETS ON DAY 3 THEN TAKE 3 TABLETS ON DAY 4 THEN TAKE 2 TABLETS ON DAY 5    . tamsulosin (FLOMAX) 0.4 MG CAPS capsule     . terazosin (HYTRIN) 2 MG capsule Take 2 mg by mouth at bedtime.     No current facility-administered medications on file prior to visit.   No Known Allergies  No results found for this or any previous visit (from the past 2160 hour(s)).  Objective: General: Patient is awake, alert, and oriented x 3 and in no acute distress.  Integument: Skin is warm, dry and  supple bilateral. Nails are tender, long, thickened and dystrophic with subungual debris, consistent with onychomycosis, 1-5 bilateral. No signs of infection. No open lesions or preulcerative lesions present bilateral, very minimal callus sub met 5 bilateral. Remaining integument unremarkable.  Vasculature:  Dorsalis Pedis pulse 1/4 bilateral. Posterior Tibial pulse  1/4 bilateral. Capillary fill time <3 sec 1-5 bilateral. Scant hair growth to the level of the digits.Temperature gradient within normal limits. No varicosities present bilateral. No edema present bilateral.   Neurology: The patient has intact sensation measured with a 5.07/10g Semmes Weinstein Monofilament at all pedal sites bilateral . Vibratory sensation diminished bilateral with tuning fork. No Babinski sign present bilateral.   Musculoskeletal:  Asymptomatic hammertoes and pes planus pedal deformities noted bilateral. Muscular strength 5/5 in all lower extremity muscular groups bilateral without pain on range of motion, no recurrent achilles pain. No tenderness with calf compression bilateral.  Assessment and Plan: Problem List Items Addressed This Visit    None    Visit Diagnoses    Pain due to onychomycosis of toenails of both feet    -  Primary   Diabetic polyneuropathy associated with type 2 diabetes mellitus ([place])          -Examined patient. -Re-discussed and educated patient on diabetic foot care, especially with regards to the vascular, neurological and musculoskeletal systems.  -Mechanically debrided all nails 1-5 bilateral using sterile nail nipper and filed with dremel without incident  -Continue with diabetic shoes and recommend gentle stretching and walking for exercise to help with circulation and blood flow and overall health -Answered all patient questions -Patient to return  in 3 months for at risk foot care -Patient advised to call the office if any problems or questions arise in the meantime.  [name], DPM

## 2019-12-30 DIAGNOSIS — H401234 Low-tension glaucoma, bilateral, indeterminate stage: Secondary | ICD-10-CM | POA: Diagnosis not present

## 2019-12-30 DIAGNOSIS — Z961 Presence of intraocular lens: Secondary | ICD-10-CM | POA: Diagnosis not present

## 2019-12-30 DIAGNOSIS — H04123 Dry eye syndrome of bilateral lacrimal glands: Secondary | ICD-10-CM | POA: Diagnosis not present

## 2019-12-30 DIAGNOSIS — H16223 Keratoconjunctivitis sicca, not specified as Sjogren's, bilateral: Secondary | ICD-10-CM | POA: Diagnosis not present

## 2019-12-30 DIAGNOSIS — H0102B Squamous blepharitis left eye, upper and lower eyelids: Secondary | ICD-10-CM | POA: Diagnosis not present

## 2019-12-30 DIAGNOSIS — H11153 Pinguecula, bilateral: Secondary | ICD-10-CM | POA: Diagnosis not present

## 2019-12-30 DIAGNOSIS — H18413 Arcus senilis, bilateral: Secondary | ICD-10-CM | POA: Diagnosis not present

## 2019-12-30 DIAGNOSIS — H0102A Squamous blepharitis right eye, upper and lower eyelids: Secondary | ICD-10-CM | POA: Diagnosis not present

## 2020-02-16 DIAGNOSIS — E1140 Type 2 diabetes mellitus with diabetic neuropathy, unspecified: Secondary | ICD-10-CM | POA: Diagnosis not present

## 2020-02-16 DIAGNOSIS — D649 Anemia, unspecified: Secondary | ICD-10-CM | POA: Diagnosis not present

## 2020-02-16 DIAGNOSIS — E7800 Pure hypercholesterolemia, unspecified: Secondary | ICD-10-CM | POA: Diagnosis not present

## 2020-02-16 DIAGNOSIS — I10 Essential (primary) hypertension: Secondary | ICD-10-CM | POA: Diagnosis not present

## 2020-02-23 DIAGNOSIS — G629 Polyneuropathy, unspecified: Secondary | ICD-10-CM | POA: Diagnosis not present

## 2020-02-23 DIAGNOSIS — E7800 Pure hypercholesterolemia, unspecified: Secondary | ICD-10-CM | POA: Diagnosis not present

## 2020-02-23 DIAGNOSIS — Z0000 Encounter for general adult medical examination without abnormal findings: Secondary | ICD-10-CM | POA: Diagnosis not present

## 2020-02-23 DIAGNOSIS — I10 Essential (primary) hypertension: Secondary | ICD-10-CM | POA: Diagnosis not present

## 2020-02-23 DIAGNOSIS — E1140 Type 2 diabetes mellitus with diabetic neuropathy, unspecified: Secondary | ICD-10-CM | POA: Diagnosis not present

## 2020-03-14 DIAGNOSIS — M545 Low back pain: Secondary | ICD-10-CM | POA: Diagnosis not present

## 2020-03-14 DIAGNOSIS — M542 Cervicalgia: Secondary | ICD-10-CM | POA: Diagnosis not present

## 2020-03-22 DIAGNOSIS — S134XXD Sprain of ligaments of cervical spine, subsequent encounter: Secondary | ICD-10-CM | POA: Diagnosis not present

## 2020-03-22 DIAGNOSIS — S338XXD Sprain of other parts of lumbar spine and pelvis, subsequent encounter: Secondary | ICD-10-CM | POA: Diagnosis not present

## 2020-03-29 DIAGNOSIS — S338XXD Sprain of other parts of lumbar spine and pelvis, subsequent encounter: Secondary | ICD-10-CM | POA: Diagnosis not present

## 2020-03-29 DIAGNOSIS — S134XXD Sprain of ligaments of cervical spine, subsequent encounter: Secondary | ICD-10-CM | POA: Diagnosis not present

## 2020-03-31 ENCOUNTER — Ambulatory Visit: Payer: Non-veteran care | Admitting: Sports Medicine

## 2020-03-31 DIAGNOSIS — S134XXD Sprain of ligaments of cervical spine, subsequent encounter: Secondary | ICD-10-CM | POA: Diagnosis not present

## 2020-03-31 DIAGNOSIS — S338XXD Sprain of other parts of lumbar spine and pelvis, subsequent encounter: Secondary | ICD-10-CM | POA: Diagnosis not present

## 2020-04-05 DIAGNOSIS — S338XXD Sprain of other parts of lumbar spine and pelvis, subsequent encounter: Secondary | ICD-10-CM | POA: Diagnosis not present

## 2020-04-05 DIAGNOSIS — S134XXD Sprain of ligaments of cervical spine, subsequent encounter: Secondary | ICD-10-CM | POA: Diagnosis not present

## 2020-04-07 DIAGNOSIS — S134XXD Sprain of ligaments of cervical spine, subsequent encounter: Secondary | ICD-10-CM | POA: Diagnosis not present

## 2020-04-07 DIAGNOSIS — S338XXD Sprain of other parts of lumbar spine and pelvis, subsequent encounter: Secondary | ICD-10-CM | POA: Diagnosis not present

## 2020-04-11 DIAGNOSIS — L219 Seborrheic dermatitis, unspecified: Secondary | ICD-10-CM | POA: Diagnosis not present

## 2020-04-11 DIAGNOSIS — L723 Sebaceous cyst: Secondary | ICD-10-CM | POA: Diagnosis not present

## 2020-04-11 DIAGNOSIS — L821 Other seborrheic keratosis: Secondary | ICD-10-CM | POA: Diagnosis not present

## 2020-04-12 DIAGNOSIS — H02886 Meibomian gland dysfunction of left eye, unspecified eyelid: Secondary | ICD-10-CM | POA: Diagnosis not present

## 2020-04-12 DIAGNOSIS — H0102B Squamous blepharitis left eye, upper and lower eyelids: Secondary | ICD-10-CM | POA: Diagnosis not present

## 2020-04-12 DIAGNOSIS — H16223 Keratoconjunctivitis sicca, not specified as Sjogren's, bilateral: Secondary | ICD-10-CM | POA: Diagnosis not present

## 2020-04-12 DIAGNOSIS — H401234 Low-tension glaucoma, bilateral, indeterminate stage: Secondary | ICD-10-CM | POA: Diagnosis not present

## 2020-04-12 DIAGNOSIS — H0102A Squamous blepharitis right eye, upper and lower eyelids: Secondary | ICD-10-CM | POA: Diagnosis not present

## 2020-04-12 DIAGNOSIS — H02883 Meibomian gland dysfunction of right eye, unspecified eyelid: Secondary | ICD-10-CM | POA: Diagnosis not present

## 2020-04-21 DIAGNOSIS — S338XXD Sprain of other parts of lumbar spine and pelvis, subsequent encounter: Secondary | ICD-10-CM | POA: Diagnosis not present

## 2020-04-21 DIAGNOSIS — S134XXD Sprain of ligaments of cervical spine, subsequent encounter: Secondary | ICD-10-CM | POA: Diagnosis not present

## 2020-04-27 DIAGNOSIS — S134XXD Sprain of ligaments of cervical spine, subsequent encounter: Secondary | ICD-10-CM | POA: Diagnosis not present

## 2020-04-27 DIAGNOSIS — S338XXD Sprain of other parts of lumbar spine and pelvis, subsequent encounter: Secondary | ICD-10-CM | POA: Diagnosis not present

## 2020-05-04 DIAGNOSIS — S338XXD Sprain of other parts of lumbar spine and pelvis, subsequent encounter: Secondary | ICD-10-CM | POA: Diagnosis not present

## 2020-05-04 DIAGNOSIS — S134XXD Sprain of ligaments of cervical spine, subsequent encounter: Secondary | ICD-10-CM | POA: Diagnosis not present

## 2020-05-05 ENCOUNTER — Ambulatory Visit (INDEPENDENT_AMBULATORY_CARE_PROVIDER_SITE_OTHER): Payer: Medicare PPO | Admitting: Sports Medicine

## 2020-05-05 ENCOUNTER — Encounter: Payer: Self-pay | Admitting: Sports Medicine

## 2020-05-05 ENCOUNTER — Other Ambulatory Visit: Payer: Self-pay

## 2020-05-05 DIAGNOSIS — B351 Tinea unguium: Secondary | ICD-10-CM | POA: Diagnosis not present

## 2020-05-05 DIAGNOSIS — E1142 Type 2 diabetes mellitus with diabetic polyneuropathy: Secondary | ICD-10-CM

## 2020-05-05 DIAGNOSIS — M2042 Other hammer toe(s) (acquired), left foot: Secondary | ICD-10-CM

## 2020-05-05 DIAGNOSIS — M2142 Flat foot [pes planus] (acquired), left foot: Secondary | ICD-10-CM

## 2020-05-05 DIAGNOSIS — M79674 Pain in right toe(s): Secondary | ICD-10-CM | POA: Diagnosis not present

## 2020-05-05 DIAGNOSIS — M2041 Other hammer toe(s) (acquired), right foot: Secondary | ICD-10-CM

## 2020-05-05 DIAGNOSIS — M79675 Pain in left toe(s): Secondary | ICD-10-CM

## 2020-05-05 DIAGNOSIS — M2141 Flat foot [pes planus] (acquired), right foot: Secondary | ICD-10-CM

## 2020-05-05 NOTE — Progress Notes (Signed)
Subjective: [name] is a 86 y.o. male patient with history of diabetes who returns to office today complaining of long,mildly painful nails while ambulating in shoes; unable to trim. Patient states that the glucose reading this morning was not recorded.  No other issues noted.   Patient Active Problem List   Diagnosis Date Noted   Gastroesophageal reflux disease without esophagitis 06/22/2016   Constipation 06/22/2016   Bladder cancer ([place]) 06/03/2014   Iron deficiency anemia, unspecified 06/13/2012   Sleep disorder 04/16/2012   Nonsustained ventricular tachycardia ([place]) 09/17/2011   Atypical chest pain 08/27/2011   GOUT, ACUTE 07/16/2008   RENAL CYST, RIGHT 07/16/2008   TRANSAMINASES, SERUM, ELEVATED 07/16/2008   DIABETES MELLITUS, TYPE II 04/12/2008   PERIPHERAL EDEMA 04/12/2008   POSTTRAUMATIC STRESS DISORDER 08/11/2007   PERIPHERAL NEUROPATHY 08/11/2007   HYPERTENSION 08/11/2007   ALLERGIC RHINITIS 08/11/2007   BENIGN PROSTATIC HYPERTROPHY 08/11/2007   DEGENERATIVE JOINT DISEASE 08/11/2007   OSTEOPOROSIS 08/11/2007   COLON CANCER, HX OF 08/11/2007   Other postprocedural status(V45.89) 08/11/2007   Current Outpatient Medications on File Prior to Visit  Medication Sig Dispense Refill   acetaminophen (TYLENOL) 325 MG tablet Take 650 mg by mouth every 6 (six) hours as needed for mild pain.      allopurinol (ZYLOPRIM) 100 MG tablet      amLODipine (NORVASC) 10 MG tablet Take by mouth.     amLODipine (NORVASC) 5 MG tablet      ammonium lactate (LAC-HYDRIN) 12 % lotion Apply 1 application topically daily as needed for dry skin.     aspirin 81 MG tablet Take 81 mg by mouth daily as needed for pain.      atenolol (TENORMIN) 25 MG tablet Take 25 mg by mouth every morning.      atorvastatin (LIPITOR) 40 MG tablet Take 40 mg by mouth daily.     azelastine (ASTELIN) 0.1 % nasal spray Place 1 spray into both nostrils 2 (two) times daily. Use in each  nostril as directed 30 mL 12   azithromycin (ZITHROMAX) 500 MG tablet Take 500 mg by mouth daily.     BETASEPT SURGICAL SCRUB 4 % external liquid      bisacodyl (DULCOLAX) 10 MG suppository Place 10 mg rectally daily as needed for moderate constipation.     brimonidine (ALPHAGAN P) 0.1 % SOLN      brimonidine (ALPHAGAN) 0.2 % ophthalmic solution      Coenzyme Q10 (CO Q 10) 100 MG CAPS Take 100 mg by mouth 2 (two) times daily.     colchicine 0.6 MG tablet      COMBIGAN 0.2-0.5 % ophthalmic solution      dorzolamide (TRUSOPT) 2 % ophthalmic solution      finasteride (PROSCAR) 5 MG tablet Take 5 mg by mouth at bedtime.     fluticasone (FLONASE) 50 MCG/ACT nasal spray Place 2 sprays into both nostrils at bedtime. 16 g 6   gabapentin (NEURONTIN) 100 MG capsule Take 100 mg by mouth 3 (three) times daily.     gabapentin (NEURONTIN) 300 MG capsule      hydrochlorothiazide (HYDRODIURIL) 25 MG tablet Take 25 mg by mouth daily.     ketoconazole (NIZORAL) 2 % cream Apply topically 2 (two) times daily.     latanoprost (XALATAN) 0.005 % ophthalmic solution Place 1 drop into the right eye at bedtime.      lisinopril (PRINIVIL,ZESTRIL) 40 MG tablet Take 40 mg by mouth daily.     LUMIGAN  0.01 % SOLN      metFORMIN (GLUCOPHAGE) 500 MG tablet Take by mouth.     omega-3 acid ethyl esters (LOVAZA) 1 g capsule Take 1 g by mouth daily at 2 PM.     omeprazole (PRILOSEC) 20 MG capsule Take 20 mg by mouth 2 (two) times daily.      omeprazole (PRILOSEC) 40 MG capsule Take 1 tablet twice daily. 180 capsule 3   predniSONE (DELTASONE) 10 MG tablet TAKE 6 TABLETS ON DAY 1 THEN TAKE 5 TABLETS ON DAY 2 THEN TAKE 4 TABLETS ON DAY 3 THEN TAKE 3 TABLETS ON DAY 4 THEN TAKE 2 TABLETS ON DAY 5     tamsulosin (FLOMAX) 0.4 MG CAPS capsule      terazosin (HYTRIN) 2 MG capsule Take 2 mg by mouth at bedtime.     timolol (TIMOPTIC) 0.5 % ophthalmic solution      No current facility-administered medications  on file prior to visit.   No Known Allergies  No results found for this or any previous visit (from the past 2160 hour(s)).  Objective: General: Patient is awake, alert, and oriented x 3 and in no acute distress.  Integument: Skin is warm, dry and supple bilateral. Nails are tender, long, thickened and dystrophic with subungual debris, consistent with onychomycosis, 1-5 bilateral. No signs of infection. No open lesions or preulcerative lesions present bilateral, very minimal callus sub met 5 bilateral. Remaining integument unremarkable.  Vasculature:  Dorsalis Pedis pulse 1/4 bilateral. Posterior Tibial pulse  1/4 bilateral. Capillary fill time <3 sec 1-5 bilateral. Scant hair growth to the level of the digits.Temperature gradient within normal limits. No varicosities present bilateral. No edema present bilateral.   Neurology: The patient has intact sensation measured with a 5.07/10g Semmes Weinstein Monofilament at all pedal sites bilateral . Vibratory sensation diminished bilateral with tuning fork. No Babinski sign present bilateral.   Musculoskeletal:  Asymptomatic hammertoes and pes planus pedal deformities noted bilateral. Muscular strength 5/5 in all lower extremity bilateral. No tenderness with calf compression bilateral.  Assessment and Plan: Problem List Items Addressed This Visit    None    Visit Diagnoses    Pain due to onychomycosis of toenails of both feet    -  Primary   Relevant Medications   ketoconazole (NIZORAL) 2 % cream   Diabetic polyneuropathy associated with type 2 diabetes mellitus ([facility])       Hammer toes of both feet       Pes planus of both feet          -Examined patient. -Re-discussed and educated patient on diabetic foot care, especially with regards to the vascular, neurological and musculoskeletal systems.  -Mechanically debrided all nails 1-5 bilateral using sterile nail nipper and filed with dremel without incident  -Answered all patient  questions -Patient to return  in 3 months for at risk foot care -Patient advised to call the office if any problems or questions arise in the meantime.  [name], DPM

## 2020-05-09 DIAGNOSIS — S134XXD Sprain of ligaments of cervical spine, subsequent encounter: Secondary | ICD-10-CM | POA: Diagnosis not present

## 2020-05-09 DIAGNOSIS — S338XXD Sprain of other parts of lumbar spine and pelvis, subsequent encounter: Secondary | ICD-10-CM | POA: Diagnosis not present

## 2020-05-12 DIAGNOSIS — S134XXD Sprain of ligaments of cervical spine, subsequent encounter: Secondary | ICD-10-CM | POA: Diagnosis not present

## 2020-05-12 DIAGNOSIS — S338XXD Sprain of other parts of lumbar spine and pelvis, subsequent encounter: Secondary | ICD-10-CM | POA: Diagnosis not present

## 2020-05-16 DIAGNOSIS — S338XXD Sprain of other parts of lumbar spine and pelvis, subsequent encounter: Secondary | ICD-10-CM | POA: Diagnosis not present

## 2020-05-16 DIAGNOSIS — S134XXD Sprain of ligaments of cervical spine, subsequent encounter: Secondary | ICD-10-CM | POA: Diagnosis not present

## 2020-05-18 DIAGNOSIS — S134XXD Sprain of ligaments of cervical spine, subsequent encounter: Secondary | ICD-10-CM | POA: Diagnosis not present

## 2020-05-18 DIAGNOSIS — S338XXD Sprain of other parts of lumbar spine and pelvis, subsequent encounter: Secondary | ICD-10-CM | POA: Diagnosis not present

## 2020-05-24 DIAGNOSIS — S134XXD Sprain of ligaments of cervical spine, subsequent encounter: Secondary | ICD-10-CM | POA: Diagnosis not present

## 2020-05-24 DIAGNOSIS — S338XXD Sprain of other parts of lumbar spine and pelvis, subsequent encounter: Secondary | ICD-10-CM | POA: Diagnosis not present

## 2020-05-27 DIAGNOSIS — S134XXD Sprain of ligaments of cervical spine, subsequent encounter: Secondary | ICD-10-CM | POA: Diagnosis not present

## 2020-05-27 DIAGNOSIS — S338XXD Sprain of other parts of lumbar spine and pelvis, subsequent encounter: Secondary | ICD-10-CM | POA: Diagnosis not present

## 2020-05-30 DIAGNOSIS — S338XXD Sprain of other parts of lumbar spine and pelvis, subsequent encounter: Secondary | ICD-10-CM | POA: Diagnosis not present

## 2020-05-30 DIAGNOSIS — S134XXD Sprain of ligaments of cervical spine, subsequent encounter: Secondary | ICD-10-CM | POA: Diagnosis not present

## 2020-06-03 DIAGNOSIS — S338XXD Sprain of other parts of lumbar spine and pelvis, subsequent encounter: Secondary | ICD-10-CM | POA: Diagnosis not present

## 2020-06-03 DIAGNOSIS — S134XXD Sprain of ligaments of cervical spine, subsequent encounter: Secondary | ICD-10-CM | POA: Diagnosis not present

## 2020-06-22 DIAGNOSIS — I10 Essential (primary) hypertension: Secondary | ICD-10-CM | POA: Diagnosis not present

## 2020-06-22 DIAGNOSIS — E1140 Type 2 diabetes mellitus with diabetic neuropathy, unspecified: Secondary | ICD-10-CM | POA: Diagnosis not present

## 2020-06-22 DIAGNOSIS — E7800 Pure hypercholesterolemia, unspecified: Secondary | ICD-10-CM | POA: Diagnosis not present

## 2020-06-27 DIAGNOSIS — N401 Enlarged prostate with lower urinary tract symptoms: Secondary | ICD-10-CM | POA: Diagnosis not present

## 2020-06-27 DIAGNOSIS — N3281 Overactive bladder: Secondary | ICD-10-CM | POA: Diagnosis not present

## 2020-06-27 DIAGNOSIS — R351 Nocturia: Secondary | ICD-10-CM | POA: Diagnosis not present

## 2020-06-27 DIAGNOSIS — Z8551 Personal history of malignant neoplasm of bladder: Secondary | ICD-10-CM | POA: Diagnosis not present

## 2020-06-29 DIAGNOSIS — Z23 Encounter for immunization: Secondary | ICD-10-CM | POA: Diagnosis not present

## 2020-06-29 DIAGNOSIS — E1140 Type 2 diabetes mellitus with diabetic neuropathy, unspecified: Secondary | ICD-10-CM | POA: Diagnosis not present

## 2020-06-29 DIAGNOSIS — E7800 Pure hypercholesterolemia, unspecified: Secondary | ICD-10-CM | POA: Diagnosis not present

## 2020-06-29 DIAGNOSIS — I10 Essential (primary) hypertension: Secondary | ICD-10-CM | POA: Diagnosis not present

## 2020-07-21 DIAGNOSIS — H16223 Keratoconjunctivitis sicca, not specified as Sjogren's, bilateral: Secondary | ICD-10-CM | POA: Diagnosis not present

## 2020-07-21 DIAGNOSIS — H524 Presbyopia: Secondary | ICD-10-CM | POA: Diagnosis not present

## 2020-07-21 DIAGNOSIS — H0288B Meibomian gland dysfunction left eye, upper and lower eyelids: Secondary | ICD-10-CM | POA: Diagnosis not present

## 2020-07-21 DIAGNOSIS — H0102B Squamous blepharitis left eye, upper and lower eyelids: Secondary | ICD-10-CM | POA: Diagnosis not present

## 2020-07-21 DIAGNOSIS — H401234 Low-tension glaucoma, bilateral, indeterminate stage: Secondary | ICD-10-CM | POA: Diagnosis not present

## 2020-07-21 DIAGNOSIS — H0102A Squamous blepharitis right eye, upper and lower eyelids: Secondary | ICD-10-CM | POA: Diagnosis not present

## 2020-07-21 DIAGNOSIS — H0288A Meibomian gland dysfunction right eye, upper and lower eyelids: Secondary | ICD-10-CM | POA: Diagnosis not present

## 2020-08-01 ENCOUNTER — Ambulatory Visit (INDEPENDENT_AMBULATORY_CARE_PROVIDER_SITE_OTHER): Payer: Medicare PPO | Admitting: Pulmonary Disease

## 2020-08-01 ENCOUNTER — Other Ambulatory Visit: Payer: Self-pay

## 2020-08-01 ENCOUNTER — Ambulatory Visit (INDEPENDENT_AMBULATORY_CARE_PROVIDER_SITE_OTHER): Payer: Medicare PPO

## 2020-08-01 ENCOUNTER — Encounter: Payer: Self-pay | Admitting: Pulmonary Disease

## 2020-08-01 VITALS — BP 120/70 | HR 88 | Temp 97.4°F | Ht 74.0 in | Wt 219.2 lb

## 2020-08-01 DIAGNOSIS — R0600 Dyspnea, unspecified: Secondary | ICD-10-CM

## 2020-08-01 DIAGNOSIS — J90 Pleural effusion, not elsewhere classified: Secondary | ICD-10-CM | POA: Diagnosis not present

## 2020-08-01 DIAGNOSIS — R0609 Other forms of dyspnea: Secondary | ICD-10-CM

## 2020-08-01 DIAGNOSIS — J439 Emphysema, unspecified: Secondary | ICD-10-CM | POA: Diagnosis not present

## 2020-08-01 NOTE — Patient Instructions (Addendum)
Nice to see you!  We will get a chest xray today   We will schedule breathing tests in the coming days to weeks - not urgent  This information will give [place] the best chance at finding out why you are short of breath  Come back in 3 months with Dr. Silas Flood

## 2020-08-01 NOTE — Progress Notes (Signed)
@Patient  ID: [name], male    DOB: May 16, 1933, 86 y.o.   MRN: [id number]  Chief Complaint  Patient presents with  . Consult    Referred by PCP Dr. Jani Gravel for history of asthma.     Referring provider: Jani Gravel, MD  HPI:   86 year old man whom are seen in consultation for related evaluation of asthma.  Chief complaint is dyspnea on exertion.  Notes from referring provider reviewed.  Patient is unsure if he is ever been diagnosed with asthma.  He does not recall using a maintenance inhaler in the past.  When he was younger he was told his nasal patches were narrow and that is what limited some of his long distance issues.  He recently picked up a over-the-counter inhaler which he has been using sparingly.  A chief complaint of dyspnea on exertion.  Present for the last 6 to 8 months.  No clear timing during the day that is better or worse.  Worse with stairs, inclines.  No other alleviating or exacerbating factors.  No cough.  He denies any weight loss or weight gain.  Does seem a bit less active compared to prepandemic levels.  Notes that he is still traveled, recently returned from [place] where his oldest sister lives in where he grew up per his report.  He does report a near 28-year smoking history.  He is concerned that may be his issues are related to smoking damage over time as he says "the body can never totally filter it out."  No chest imaging to review.  He says not much has been tried to help with shortness of breath per his report.  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  MMRC: mMRC Dyspnea Scale mMRC Score  08/01/2020 2    Epworth:  No  flowsheet data found.  Tests:   FENO:  No results found for: NITRICOXIDE  PFT: No flowsheet data found.  WALK:  No flowsheet data found.  Imaging: None seen in EMR  Lab Results: Personally reviewed and as per EMR, notably eosinophils 200 whn most recently checked via EMR in 2016 CBC    Component Value Date/Time   WBC 4.3 07/22/2015 0945   RBC 4.74 07/22/2015 0945   HGB 13.7 07/22/2015 0945   HCT 41.6 07/22/2015 0945   PLT 182 07/22/2015 0945   MCV 87.8 07/22/2015 0945   MCV 88.8 09/04/2012 1822   MCH 28.9 07/22/2015 0945   MCHC 32.9 07/22/2015 0945   RDW 13.5 07/22/2015 0945   LYMPHSABS 1.4 06/10/2012 1610   MONOABS 0.4 06/10/2012 1610   EOSABS 0.2 06/10/2012 1610   BASOSABS 0.0 06/10/2012 1610    BMET    Component Value Date/Time   NA 142 07/22/2015 0945   K 3.8 07/22/2015 0945   CL 107 07/22/2015 0945   CO2 28 07/22/2015 0945   GLUCOSE 108 (H) 07/22/2015 0945   BUN 21 (H) 07/22/2015 0945   CREATININE 1.07 07/22/2015 0945   CALCIUM 9.7 07/22/2015 0945   GFRNONAA >60 07/22/2015 0945   GFRAA >60 07/22/2015 0945    BNP No results found for: BNP  ProBNP No results found for: PROBNP  Specialty Problems  Pulmonary Problems   ALLERGIC RHINITIS    Qualifier: Diagnosis of  By: Quentin Cornwall CMA, Jessica           No Known Allergies  Immunization History  Administered Date(s) Administered  . Influenza Whole 07/04/2012  . Influenza, High Dose Seasonal PF 05/20/2016  . Influenza, Quadrivalent, Recombinant, Inj, Pf 07/07/2018, 07/20/2019, 06/29/2020  . PFIZER SARS-COV-2 Vaccination 09/20/2019, 10/10/2019, 05/18/2020  . Pneumococcal Conjugate-13 01/12/2019    Past Medical History:  Diagnosis Date  . Allergic rhinitis   . Anemia   . Anxiety   . Bladder tumor    WAS HAVING BLOOD IN URINE - CLEARED BUT OCCAS CLOT NOW  . BPH (benign prostatic hypertrophy)    FREQUENT URINATION   . Cancer ([place])   . Cataract    bilateral extrraction and lens  implants '88-,'89  . Depression   . Diverticulosis   . DJD (degenerative joint disease)   . DM2 (diabetes mellitus, type 2) ([place])   . Dyspepsia   . GERD (gastroesophageal reflux disease)   . Glaucoma   . Glaucoma   . Headache   . Hearing loss    bilateral  . History of colon cancer   . Hypertension   . Multiple pulmonary nodules    SEEN ON CT CHEST DONE 05/25/14 AT [facility]  . Nonsustained ventricular tachycardia ([facility])    PAST HISTORY  . Osteoporosis   . Peripheral neuropathy   . Posttraumatic stress disorder   . Restless leg syndrome   . Tubular adenoma 02/17/2008    Tobacco History: Social History   Tobacco Use  Smoking Status Former Smoker  . Packs/day: 0.75  . Years: 30.00  . Pack years: 22.50  . Start date: 15  . Quit date: 35  . Years since quitting: 42.9  Smokeless Tobacco Never Used   Counseling given: Not Answered   Continue to not smoke  Outpatient Encounter Medications as of 08/01/2020  Medication Sig  . acetaminophen (TYLENOL) 325 MG tablet Take 650 mg by mouth every 6 (six) hours as needed for mild pain.   Marland Kitchen allopurinol (ZYLOPRIM) 100 MG tablet   . amLODipine (NORVASC) 5 MG tablet   . ammonium lactate (LAC-HYDRIN) 12 % lotion Apply 1 application topically daily as needed for dry skin.  Marland Kitchen aspirin 81 MG tablet Take 81 mg by mouth daily as needed for pain.   Marland Kitchen atenolol (TENORMIN) 25 MG tablet Take 25 mg by mouth every morning.   Marland Kitchen atorvastatin (LIPITOR) 40 MG tablet Take 40 mg by mouth daily.  Marland Kitchen azelastine (ASTELIN) 0.1 % nasal spray Place 1 spray into both nostrils 2 (two) times daily. Use in each nostril as directed  . azithromycin (ZITHROMAX) 500 MG tablet Take 500 mg by mouth daily.  Marland Kitchen BETASEPT SURGICAL SCRUB 4 % external liquid   . bisacodyl (DULCOLAX) 10 MG suppository Place 10 mg rectally daily as needed for moderate constipation.  . brimonidine (ALPHAGAN P) 0.1 % SOLN   . brimonidine (ALPHAGAN) 0.2 % ophthalmic solution    . Coenzyme Q10 (CO Q 10) 100 MG CAPS Take 100 mg by mouth 2 (two) times daily.  . colchicine 0.6 MG tablet   . COMBIGAN 0.2-0.5 % ophthalmic solution   . dorzolamide (TRUSOPT) 2 % ophthalmic solution   . finasteride (PROSCAR) 5 MG tablet Take 5 mg by mouth at bedtime.  . fluticasone (FLONASE) 50 MCG/ACT nasal spray Place 2 sprays into both nostrils at bedtime.  . gabapentin (NEURONTIN) 100 MG capsule  Take 100 mg by mouth 3 (three) times daily.  Marland Kitchen gabapentin (NEURONTIN) 300 MG capsule   . hydrochlorothiazide (HYDRODIURIL) 25 MG tablet Take 25 mg by mouth daily.  Marland Kitchen ketoconazole (NIZORAL) 2 % cream Apply topically 2 (two) times daily.  Marland Kitchen latanoprost (XALATAN) 0.005 % ophthalmic solution Place 1 drop into the right eye at bedtime.   Marland Kitchen lisinopril (PRINIVIL,ZESTRIL) 40 MG tablet Take 40 mg by mouth daily.  Marland Kitchen LUMIGAN 0.01 % SOLN   . metFORMIN (GLUCOPHAGE) 500 MG tablet Take by mouth.  . omega-3 acid ethyl esters (LOVAZA) 1 g capsule Take 1 g by mouth daily at 2 PM.  . omeprazole (PRILOSEC) 20 MG capsule Take 20 mg by mouth 2 (two) times daily.  Marland Kitchen omeprazole (PRILOSEC) 40 MG capsule Take 1 tablet twice daily.  . predniSONE (DELTASONE) 10 MG tablet TAKE 6 TABLETS ON DAY 1 THEN TAKE 5 TABLETS ON DAY 2 THEN TAKE 4 TABLETS ON DAY 3 THEN TAKE 3 TABLETS ON DAY 4 THEN TAKE 2 TABLETS ON DAY 5  . tamsulosin (FLOMAX) 0.4 MG CAPS capsule   . terazosin (HYTRIN) 2 MG capsule Take 2 mg by mouth at bedtime.  . timolol (TIMOPTIC) 0.5 % ophthalmic solution   . [DISCONTINUED] amLODipine (NORVASC) 10 MG tablet Take by mouth.   No facility-administered encounter medications on file as of 08/01/2020.     Review of Systems  Review of Systems  No chest pain with exertion. No lower extremity swelling. No orthopnea or PND. Comprehensive review of systems otherwise negative. Physical Exam  BP 120/70   Pulse 88   Temp (!) 97.4 F (36.3 C) (Temporal)   Ht 6\' 2"  (1.88 m)   Wt 219 lb 3.2 oz (99.4 kg)   SpO2 98%  Comment: on RA  BMI 28.14 kg/m   Wt Readings from Last 5 Encounters:  08/01/20 219 lb 3.2 oz (99.4 kg)  06/22/16 217 lb (98.4 kg)  07/26/15 210 lb (95.3 kg)  07/22/15 210 lb (95.3 kg)  06/24/14 200 lb 12.8 oz (91.1 kg)    BMI Readings from Last 5 Encounters:  08/01/20 28.14 kg/m  06/22/16 27.86 kg/m  07/26/15 26.96 kg/m  07/22/15 26.96 kg/m  06/24/14 25.78 kg/m     Physical Exam General: Well-appearing, no acute distress Eyes: EOMI, no icterus Neck: Supple, no JVP appreciated Respiratory: Clear to auscultation bilaterally, distant breath sounds Cardiovascular: Regular rate and rhythm, no murmurs, no edema Abdomen: Nondistended, bowel sounds present MSK: No synovitis, no joint effusion, digit on left hand appears knotted, arthritic Neuro: Normal gait with assistance of walker, no weakness Psych: Normal mood, full affect   Assessment & Plan:   DOE: Likely multifactorial. Deconditioning, possible COPD, possible asthma. Breath sounds distance, likely obstructive process. Will obtain PFTs in coming weeks. CXR today. Further discussion with patient pending results.   Asthma: Reason for consult. Patient does not recall diagnosis of asthma or using inhalers. Does have significant atopic symptoms, rhinitis which is treated with nasal sprays. He says he has not used inhalers in the past but has obtained an over-the-counter inhaler recently. Not sure how this was obtained. Asthma is potential contributor to his symptom of dyspnea exertion. We'll start with PFTs. Consider ICS/LABA therapy even if normal PFTs.  Return in about 3 months (around 10/30/2020).   Lanier Clam, MD 08/01/2020

## 2020-08-02 ENCOUNTER — Telehealth: Payer: Self-pay | Admitting: Pulmonary Disease

## 2020-08-03 NOTE — Telephone Encounter (Signed)
Nothing noted in message. Will close encounter as it was created in error.  °

## 2020-08-04 ENCOUNTER — Ambulatory Visit (INDEPENDENT_AMBULATORY_CARE_PROVIDER_SITE_OTHER): Payer: Medicare PPO | Admitting: Sports Medicine

## 2020-08-04 ENCOUNTER — Encounter: Payer: Self-pay | Admitting: Sports Medicine

## 2020-08-04 ENCOUNTER — Other Ambulatory Visit: Payer: Self-pay

## 2020-08-04 DIAGNOSIS — M79675 Pain in left toe(s): Secondary | ICD-10-CM

## 2020-08-04 DIAGNOSIS — M2042 Other hammer toe(s) (acquired), left foot: Secondary | ICD-10-CM

## 2020-08-04 DIAGNOSIS — B351 Tinea unguium: Secondary | ICD-10-CM

## 2020-08-04 DIAGNOSIS — M2141 Flat foot [pes planus] (acquired), right foot: Secondary | ICD-10-CM

## 2020-08-04 DIAGNOSIS — M2142 Flat foot [pes planus] (acquired), left foot: Secondary | ICD-10-CM

## 2020-08-04 DIAGNOSIS — M79674 Pain in right toe(s): Secondary | ICD-10-CM

## 2020-08-04 DIAGNOSIS — E1142 Type 2 diabetes mellitus with diabetic polyneuropathy: Secondary | ICD-10-CM | POA: Diagnosis not present

## 2020-08-04 DIAGNOSIS — M2041 Other hammer toe(s) (acquired), right foot: Secondary | ICD-10-CM

## 2020-08-04 NOTE — Progress Notes (Signed)
Subjective: [name] is a 86 y.o. male patient with history of diabetes who returns to office today complaining of long,mildly painful nails while ambulating in shoes; unable to trim. Patient states that the glucose reading this morning was not recorded.  No other issues noted.   Reports that he will be going [place] for several months.    Patient Active Problem List   Diagnosis Date Noted  . Gastroesophageal reflux disease without esophagitis 06/22/2016  . Constipation 06/22/2016  . Bladder cancer ([place]) 06/03/2014  . Iron deficiency anemia, unspecified 06/13/2012  . Sleep disorder 04/16/2012  . Nonsustained ventricular tachycardia ([place]) 09/17/2011  . Atypical chest pain 08/27/2011  . GOUT, ACUTE 07/16/2008  . RENAL CYST, RIGHT 07/16/2008  . TRANSAMINASES, SERUM, ELEVATED 07/16/2008  . DIABETES MELLITUS, TYPE II 04/12/2008  . PERIPHERAL EDEMA 04/12/2008  . POSTTRAUMATIC STRESS DISORDER 08/11/2007  . PERIPHERAL NEUROPATHY 08/11/2007  . HYPERTENSION 08/11/2007  . ALLERGIC RHINITIS 08/11/2007  . BENIGN PROSTATIC HYPERTROPHY 08/11/2007  . DEGENERATIVE JOINT DISEASE 08/11/2007  . OSTEOPOROSIS 08/11/2007  . COLON CANCER, HX OF 08/11/2007  . Other postprocedural status(V45.89) 08/11/2007   Current Outpatient Medications on File Prior to Visit  Medication Sig Dispense Refill  . acetaminophen (TYLENOL) 325 MG tablet Take 650 mg by mouth every 6 (six) hours as needed for mild pain.     Marland Kitchen allopurinol (ZYLOPRIM) 100 MG tablet     . amLODipine (NORVASC) 5 MG tablet     . ammonium lactate (LAC-HYDRIN) 12 % lotion Apply 1 application topically daily as needed for dry skin.    Marland Kitchen aspirin 81 MG tablet Take 81 mg by mouth daily as needed for pain.     Marland Kitchen atenolol (TENORMIN) 25 MG tablet Take 25 mg by mouth every morning.     Marland Kitchen atorvastatin (LIPITOR) 40 MG tablet Take 40 mg by mouth daily.    Marland Kitchen azelastine (ASTELIN) 0.1 % nasal spray Place 1 spray into both nostrils 2 (two) times daily.  Use in each nostril as directed 30 mL 12  . azithromycin (ZITHROMAX) 500 MG tablet Take 500 mg by mouth daily.    Marland Kitchen BETASEPT SURGICAL SCRUB 4 % external liquid     . bisacodyl (DULCOLAX) 10 MG suppository Place 10 mg rectally daily as needed for moderate constipation.    . brimonidine (ALPHAGAN P) 0.1 % SOLN     . brimonidine (ALPHAGAN) 0.2 % ophthalmic solution     . Coenzyme Q10 (CO Q 10) 100 MG CAPS Take 100 mg by mouth 2 (two) times daily.    . colchicine 0.6 MG tablet     . COMBIGAN 0.2-0.5 % ophthalmic solution     . dorzolamide (TRUSOPT) 2 % ophthalmic solution     . finasteride (PROSCAR) 5 MG tablet Take 5 mg by mouth at bedtime.    . fluticasone (FLONASE) 50 MCG/ACT nasal spray Place 2 sprays into both nostrils at bedtime. 16 g 6  . gabapentin (NEURONTIN) 100 MG capsule Take 100 mg by mouth 3 (three) times daily.    Marland Kitchen gabapentin (NEURONTIN) 300 MG capsule     . hydrochlorothiazide (HYDRODIURIL) 25 MG tablet Take 25 mg by mouth daily.    Marland Kitchen ketoconazole (NIZORAL) 2 % cream Apply topically 2 (two) times daily.    Marland Kitchen latanoprost (XALATAN) 0.005 % ophthalmic solution Place 1 drop into the right eye at bedtime.     Marland Kitchen lisinopril (PRINIVIL,ZESTRIL) 40 MG tablet Take 40 mg by mouth daily.    Marland Kitchen  LUMIGAN 0.01 % SOLN     . metFORMIN (GLUCOPHAGE) 500 MG tablet Take by mouth.    . omega-3 acid ethyl esters (LOVAZA) 1 g capsule Take 1 g by mouth daily at 2 PM.    . omeprazole (PRILOSEC) 20 MG capsule Take 20 mg by mouth 2 (two) times daily.    Marland Kitchen omeprazole (PRILOSEC) 40 MG capsule Take 1 tablet twice daily. 180 capsule 3  . predniSONE (DELTASONE) 10 MG tablet TAKE 6 TABLETS ON DAY 1 THEN TAKE 5 TABLETS ON DAY 2 THEN TAKE 4 TABLETS ON DAY 3 THEN TAKE 3 TABLETS ON DAY 4 THEN TAKE 2 TABLETS ON DAY 5    . tamsulosin (FLOMAX) 0.4 MG CAPS capsule     . terazosin (HYTRIN) 2 MG capsule Take 2 mg by mouth at bedtime.    . timolol (TIMOPTIC) 0.5 % ophthalmic solution      No current facility-administered  medications on file prior to visit.   No Known Allergies  No results found for this or any previous visit (from the past 2160 hour(s)).  Objective: General: Patient is awake, alert, and oriented x 3 and in no acute distress.  Integument: Skin is warm, dry and supple bilateral. Nails are tender, long, thickened and dystrophic with subungual debris, consistent with onychomycosis, 1-5 bilateral. No signs of infection. No open lesions or preulcerative lesions present bilateral, very minimal callus sub met 5 bilateral. Remaining integument unremarkable.  Vasculature:  Dorsalis Pedis pulse 1/4 bilateral. Posterior Tibial pulse  1/4 bilateral. Capillary fill time <3 sec 1-5 bilateral. Scant hair growth to the level of the digits.Temperature gradient within normal limits. No varicosities present bilateral. No edema present bilateral.   Neurology: The patient has intact sensation measured with a 5.07/10g Semmes Weinstein Monofilament at all pedal sites bilateral . Vibratory sensation diminished bilateral with tuning fork. No Babinski sign present bilateral.   Musculoskeletal:  Asymptomatic hammertoes and pes planus pedal deformities noted bilateral. Muscular strength 5/5 in all lower extremity bilateral. No tenderness with calf compression bilateral.  Assessment and Plan: Problem List Items Addressed This Visit   None   Visit Diagnoses    Pain due to onychomycosis of toenails of both feet    -  Primary   Diabetic polyneuropathy associated with type 2 diabetes mellitus (HCC)       Hammer toes of both feet       Pes planus of both feet          -Examined patient. -Re-discussed importance of daily foot inspection in the setting of diabetes -Mechanically debrided all nails 1-5 bilateral using sterile nail nipper and filed with dremel without incident  -Answered all patient questions -Patient to return  in 3 months for at risk foot care -Patient advised to call the office if any problems or  questions arise in the meantime.  [name], DPM

## 2020-08-17 ENCOUNTER — Other Ambulatory Visit: Payer: Self-pay

## 2020-08-17 ENCOUNTER — Ambulatory Visit (INDEPENDENT_AMBULATORY_CARE_PROVIDER_SITE_OTHER): Payer: Medicare PPO | Admitting: Pulmonary Disease

## 2020-08-17 DIAGNOSIS — R0609 Other forms of dyspnea: Secondary | ICD-10-CM

## 2020-08-17 DIAGNOSIS — R0600 Dyspnea, unspecified: Secondary | ICD-10-CM

## 2020-08-17 LAB — PULMONARY FUNCTION TEST
DL/VA % pred: 65 %
DL/VA: 3.18 ml/min/mmHg/L
DLCO unc % pred: 42 %
DLCO unc: 16.65 ml/min/mmHg
FEF 25-75 Post: 0.82 L/sec
FEF 25-75 Pre: 0.54 L/sec
FEF2575-%Change-Post: 52 %
FEF2575-%Pred-Post: 45 %
FEF2575-%Pred-Pre: 29 %
FEV1-%Change-Post: 17 %
FEV1-%Pred-Post: 54 %
FEV1-%Pred-Pre: 45 %
FEV1-Post: 1.47 L
FEV1-Pre: 1.25 L
FEV1FVC-%Change-Post: 7 %
FEV1FVC-%Pred-Pre: 67 %
FEV6-%Change-Post: 9 %
FEV6-%Pred-Post: 66 %
FEV6-%Pred-Pre: 60 %
FEV6-Post: 2.72 L
FEV6-Pre: 2.48 L
FEV6FVC-%Change-Post: 0 %
FEV6FVC-%Pred-Post: 90 %
FEV6FVC-%Pred-Pre: 90 %
FVC-%Change-Post: 9 %
FVC-%Pred-Post: 73 %
FVC-%Pred-Pre: 66 %
FVC-Post: 2.73 L
FVC-Pre: 2.49 L
Post FEV1/FVC ratio: 54 %
Post FEV6/FVC ratio: 100 %
Pre FEV1/FVC ratio: 50 %
Pre FEV6/FVC Ratio: 99 %

## 2020-08-17 NOTE — Progress Notes (Signed)
Spriometry pre and post/ DLCO performed today. Attempted both Pleth and Nitrogen washout as patient was not able to perform.

## 2020-11-02 ENCOUNTER — Ambulatory Visit (INDEPENDENT_AMBULATORY_CARE_PROVIDER_SITE_OTHER): Payer: Medicare PPO | Admitting: Pulmonary Disease

## 2020-11-02 ENCOUNTER — Encounter: Payer: Self-pay | Admitting: Pulmonary Disease

## 2020-11-02 ENCOUNTER — Other Ambulatory Visit: Payer: Self-pay

## 2020-11-02 VITALS — BP 134/78 | HR 87 | Temp 98.3°F | Ht 74.0 in | Wt 203.8 lb

## 2020-11-02 DIAGNOSIS — J4520 Mild intermittent asthma, uncomplicated: Secondary | ICD-10-CM

## 2020-11-02 DIAGNOSIS — J439 Emphysema, unspecified: Secondary | ICD-10-CM

## 2020-11-02 MED ORDER — FLUTICASONE-SALMETEROL 250-50 MCG/DOSE IN AEPB: 1.0000 | INHALATION_SPRAY | Freq: Two times a day (BID) | RESPIRATORY_TRACT | 11 refills | Status: DC

## 2020-11-02 MED ORDER — MONTELUKAST SODIUM 10 MG PO TABS: 10.0000 mg | ORAL_TABLET | Freq: Every day | ORAL | 11 refills | Status: AC

## 2020-11-02 NOTE — Patient Instructions (Signed)
Nice to see you again  I sent prescription for a new inhaler and new pill to help with your breathing and allergies to the [facility].  Use Wixela 1 puff twice a day every day.  Please rinse out your mouth with water after every use.  This is to help your breathing.  Take montelukast 1 pill every day at bedtime.  I suggest this because it makes some people sleepy.  This should help with your nasal congestion and breathing.  Return to clinic in 6 months, if you have moved to [facility] by then I wish you the best, you can call and cancel that appointment

## 2020-11-02 NOTE — Progress Notes (Signed)
@Patient  ID: [name], male    DOB: Aug 27, 1932, 87 y.o.   MRN: [id number]  Chief Complaint  Patient presents with  . Follow-up    Nasal drainage with white mucus    Referring provider: Jani Gravel, MD  HPI:   87 year old man whom we are seeing in follow-up of dyspnea on exertion.  Breathing the same.  Still with nasal congestion, occasional cough.  Reviewed PFTs in detail which showed severe obstruction, significant bronchodilator response.  Counseled and I think he would respond well to inhalers.  Given his nasal congestion, will start with ICS/LABA treat primarily asthma-like symptoms.  Discussed use of montelukast as well for nasal symptoms.  He expressed understanding.  Unfortunate, his wife died last month.  He is planned emergently to be closer to his son and grandsons in [place].  The other option was [place] where he has family.  He plans to establish care with [facility] physicians there for ongoing follow-up.  He says his PCP via the [place] will arrange all this.  HPI at initial visit Patient is unsure if he is ever been diagnosed with asthma.  He does not recall using a maintenance inhaler in the past.  When he was younger he was told his nasal patches were narrow and that is what limited some of his long distance issues.  He recently picked up a over-the-counter inhaler which he has been using sparingly.  A chief complaint of dyspnea on exertion.  Present for the last 6 to 8 months.  No clear timing during the day that is better or worse.  Worse with stairs, inclines.  No other alleviating or exacerbating factors.  No cough.  He denies any weight loss or weight gain.  Does seem a bit less active compared to prepandemic levels.  Notes that he is still traveled, recently returned from [place] where his oldest sister lives in where he grew up per his report.  He does report a near 28-year smoking history.  He is concerned that may be his issues are related to smoking damage over  time as he says "the body can never totally filter it out."  No chest imaging to review.  He says not much has been tried to help with shortness of breath per his report.  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  MMRC: mMRC Dyspnea Scale mMRC Score  11/02/2020 2  08/01/2020 2    Epworth:  No flowsheet data found.  Tests:   FENO:  No results found for: NITRICOXIDE  PFT: PFT Results Latest Ref Rng & Units 08/17/2020  FVC-Pre L 2.49  FVC-Predicted Pre % 66  FVC-Post L 2.73  FVC-Predicted Post % 73  Pre FEV1/FVC % % 50  Post FEV1/FCV % % 54  FEV1-Pre L 1.25  FEV1-Predicted Pre % 45  FEV1-Post L 1.47  DLCO uncorrected ml/min/mmHg 16.65  DLCO UNC% % 42  DLVA Predicted % 65  Personally reviewed, fixed obstruction with significant bronchodilator response, gas trapping, severely reduced DLCO  WALK:  No flowsheet data found.  Imaging: None seen in EMR  Lab Results: Personally reviewed and as per EMR, notably eosinophils 200 when most recently checked via EMR in 2016 CBC  Component Value Date/Time   WBC 4.3 07/22/2015 0945   RBC 4.74 07/22/2015 0945   HGB 13.7 07/22/2015 0945   HCT 41.6 07/22/2015 0945   PLT 182 07/22/2015 0945   MCV 87.8 07/22/2015 0945   MCV 88.8 09/04/2012 1822   MCH 28.9 07/22/2015 0945   MCHC 32.9 07/22/2015 0945   RDW 13.5 07/22/2015 0945   LYMPHSABS 1.4 06/10/2012 1610   MONOABS 0.4 06/10/2012 1610   EOSABS 0.2 06/10/2012 1610   BASOSABS 0.0 06/10/2012 1610    BMET    Component Value Date/Time   NA 142 07/22/2015 0945   K 3.8 07/22/2015 0945   CL 107 07/22/2015 0945   CO2 28 07/22/2015 0945   GLUCOSE 108 (H)  07/22/2015 0945   BUN 21 (H) 07/22/2015 0945   CREATININE 1.07 07/22/2015 0945   CALCIUM 9.7 07/22/2015 0945   GFRNONAA >60 07/22/2015 0945   GFRAA >60 07/22/2015 0945    BNP No results found for: BNP  ProBNP No results found for: PROBNP  Specialty Problems      Pulmonary Problems   ALLERGIC RHINITIS    Qualifier: Diagnosis of  By: Quentin Cornwall CMA, Jessica           No Known Allergies  Immunization History  Administered Date(s) Administered  . Hep A / Hep B 06/24/2013  . Hepatitis A, Adult 09/21/1998  . Influenza Whole 07/04/2012  . Influenza, High Dose Seasonal PF 11/16/2015, 05/20/2016  . Influenza, Quadrivalent, Recombinant, Inj, Pf 07/07/2018, 07/20/2019, 06/29/2020  . Influenza, Seasonal, Injecte, Preservative Fre 08/04/2009, 05/28/2013  . Influenza,trivalent, recombinat, inj, PF 06/12/2017  . Influenza-Unspecified 06/01/1997  . PFIZER(Purple Top)SARS-COV-2 Vaccination 09/03/2019, 09/24/2019, 05/18/2020  . Pneumococcal Conjugate-13 05/28/2013, 01/12/2019  . Pneumococcal Polysaccharide-23 06/01/1997, 06/25/2002, 10/18/2011  . Tdap 08/26/2012  . Tetanus 02/02/1998  . Zoster 08/08/2010  . Zoster Recombinat (Shingrix) 03/02/2020, 05/06/2020    Past Medical History:  Diagnosis Date  . Allergic rhinitis   . Anemia   . Anxiety   . Bladder tumor    WAS HAVING BLOOD IN URINE - CLEARED BUT OCCAS CLOT NOW  . BPH (benign prostatic hypertrophy)    FREQUENT URINATION   . Cancer ([place])   . Cataract    bilateral extrraction and lens implants '88-,'89  . Depression   . Diverticulosis   . DJD (degenerative joint disease)   . DM2 (diabetes mellitus, type 2) ([place])   . Dyspepsia   . GERD (gastroesophageal reflux disease)   . Glaucoma   . Glaucoma   . Headache   . Hearing loss    bilateral  . History of colon cancer   . Hypertension   . Multiple pulmonary nodules    SEEN ON CT CHEST D[facility] 05/25/14 AT [facility]  . Nonsustained ventricular  tachycardia (HCC)    PAST HISTORY  . Osteoporosis   . Peripheral neuropathy   . Posttraumatic stress disorder   . Restless leg syndrome   . Tubular adenoma 02/17/2008    Tobacco History: Social History   Tobacco Use  Smoking Status Former Smoker  . Packs/day: 0.75  . Years: 30.00  . Pack years: 22.50  . Start date: 64  . Quit date: 96  . Years since quitting: 43.2  Smokeless Tobacco Never Used   Counseling given: Not Answered   Continue to not smoke  Outpatient Encounter Medications as of 11/02/2020  Medication Sig  . acetaminophen (TYLENOL) 325 MG tablet Take 650 mg by mouth every 6 (six) hours as needed  for mild pain.   Marland Kitchen allopurinol (ZYLOPRIM) 100 MG tablet   . amLODipine (NORVASC) 5 MG tablet   . ammonium lactate (LAC-HYDRIN) 12 % lotion Apply 1 application topically daily as needed for dry skin.  Marland Kitchen aspirin 81 MG tablet Take 81 mg by mouth daily as needed for pain.   Marland Kitchen atenolol (TENORMIN) 25 MG tablet Take 25 mg by mouth every morning.   Marland Kitchen atorvastatin (LIPITOR) 40 MG tablet Take 40 mg by mouth daily.  Marland Kitchen azelastine (ASTELIN) 0.1 % nasal spray Place 1 spray into both nostrils 2 (two) times daily. Use in each nostril as directed  . azithromycin (ZITHROMAX) 500 MG tablet Take 500 mg by mouth daily.  Marland Kitchen BETASEPT SURGICAL SCRUB 4 % external liquid   . bisacodyl (DULCOLAX) 10 MG suppository Place 10 mg rectally daily as needed for moderate constipation.  . brimonidine (ALPHAGAN P) 0.1 % SOLN   . brimonidine (ALPHAGAN) 0.2 % ophthalmic solution   . Coenzyme Q10 (CO Q 10) 100 MG CAPS Take 100 mg by mouth 2 (two) times daily.  . colchicine 0.6 MG tablet   . COMBIGAN 0.2-0.5 % ophthalmic solution   . dorzolamide (TRUSOPT) 2 % ophthalmic solution   . finasteride (PROSCAR) 5 MG tablet Take 5 mg by mouth at bedtime.  . fluticasone (FLONASE) 50 MCG/ACT nasal spray Place 2 sprays into both nostrils at bedtime.  . Fluticasone-Salmeterol (WIXELA INHUB) 250-50 MCG/DOSE AEPB Inhale 1  puff into the lungs 2 (two) times daily.  Marland Kitchen gabapentin (NEURONTIN) 100 MG capsule Take 100 mg by mouth 3 (three) times daily.  Marland Kitchen gabapentin (NEURONTIN) 300 MG capsule   . hydrochlorothiazide (HYDRODIURIL) 25 MG tablet Take 25 mg by mouth daily.  Marland Kitchen ketoconazole (NIZORAL) 2 % cream Apply topically 2 (two) times daily.  Marland Kitchen latanoprost (XALATAN) 0.005 % ophthalmic solution Place 1 drop into the right eye at bedtime.   Marland Kitchen lisinopril (PRINIVIL,ZESTRIL) 40 MG tablet Take 40 mg by mouth daily.  Marland Kitchen LUMIGAN 0.01 % SOLN   . metFORMIN (GLUCOPHAGE) 500 MG tablet Take by mouth.  . montelukast (SINGULAIR) 10 MG tablet Take 1 tablet (10 mg total) by mouth at bedtime.  Marland Kitchen omega-3 acid ethyl esters (LOVAZA) 1 g capsule Take 1 g by mouth daily at 2 PM.  . omeprazole (PRILOSEC) 20 MG capsule Take 20 mg by mouth 2 (two) times daily.  Marland Kitchen omeprazole (PRILOSEC) 40 MG capsule Take 1 tablet twice daily.  . predniSONE (DELTASONE) 10 MG tablet TAKE 6 TABLETS ON DAY 1 THEN TAKE 5 TABLETS ON DAY 2 THEN TAKE 4 TABLETS ON DAY 3 THEN TAKE 3 TABLETS ON DAY 4 THEN TAKE 2 TABLETS ON DAY 5  . tamsulosin (FLOMAX) 0.4 MG CAPS capsule   . terazosin (HYTRIN) 2 MG capsule Take 2 mg by mouth at bedtime.  . timolol (TIMOPTIC) 0.5 % ophthalmic solution    No facility-administered encounter medications on file as of 11/02/2020.     Review of Systems  Review of Systems  n/a Physical Exam  BP 134/78 (BP Location: Left Arm, Cuff Size: Normal)   Pulse 87   Temp 98.3 F (36.8 C) (Temporal)   Ht 6\' 2"  (1.88 m)   Wt 203 lb 12.8 oz (92.4 kg)   SpO2 98% Comment: Room air  BMI 26.17 kg/m   Wt Readings from Last 5 Encounters:  11/02/20 203 lb 12.8 oz (92.4 kg)  08/01/20 219 lb 3.2 oz (99.4 kg)  06/22/16 217 lb (98.4 kg)  07/26/15 210  lb (95.3 kg)  07/22/15 210 lb (95.3 kg)    BMI Readings from Last 5 Encounters:  11/02/20 26.17 kg/m  08/01/20 28.14 kg/m  06/22/16 27.86 kg/m  07/26/15 26.96 kg/m  07/22/15 26.96 kg/m      Physical Exam General: Well-appearing, no acute distress Eyes: EOMI, no icterus Neck: Supple, no JVP appreciated Respiratory: Clear to auscultation bilaterally, distant breath sounds Cardiovascular: Regular rate and rhythm, no murmurs, no edema Psych: Normal mood, full affect   Assessment & Plan:   DOE: Likely multifactorial. Deconditioning, COPD, asthma. Wixela ordered today. Would benefit from pulmonary rehab.   Asthma/COPD: PFTs with fixed obstruction, significant BD response. Wixela BID ordered today.  Moving to [facility] in coming months after death of wife. Likely will not be in town and ok to cancel if moves before next visit.  Return in about 6 months (around 05/05/2021).   Lanier Clam, MD 11/02/2020

## 2020-11-03 ENCOUNTER — Telehealth: Payer: Self-pay | Admitting: Pulmonary Disease

## 2020-11-03 ENCOUNTER — Ambulatory Visit: Payer: Non-veteran care | Admitting: Sports Medicine

## 2020-11-03 NOTE — Telephone Encounter (Signed)
Called the pt  He states that he has everything taken care of  Nothing needed

## 2020-11-10 ENCOUNTER — Telehealth: Payer: Self-pay | Admitting: Pulmonary Disease

## 2020-11-10 DIAGNOSIS — J439 Emphysema, unspecified: Secondary | ICD-10-CM

## 2020-11-10 DIAGNOSIS — J4520 Mild intermittent asthma, uncomplicated: Secondary | ICD-10-CM

## 2020-11-10 NOTE — Telephone Encounter (Signed)
Attempted to call pt but unable to reach. Left message for him to return call. °

## 2020-11-10 NOTE — Telephone Encounter (Signed)
Pt states the inhaler was not sent properly to [facility]. Pt needs a paper Rx for Wixela(?) and CMN for medication because there are channels in order to get in with the [place]. Pt states he will come pick rx and info up to hand deliver to the PCP at [facility]. Pt also needs results of pft to bring to the [place]. This will establish chain between [facility] and pt's PCP- [facility].Please advise [phone]

## 2020-11-11 MED ORDER — FLUTICASONE-SALMETEROL 250-50 MCG/DOSE IN AEPB: 1.0000 | INHALATION_SPRAY | Freq: Two times a day (BID) | RESPIRATORY_TRACT | 11 refills | Status: DC

## 2020-11-11 MED ORDER — FLUTICASONE-SALMETEROL 250-50 MCG/DOSE IN AEPB: 1.0000 | INHALATION_SPRAY | Freq: Two times a day (BID) | RESPIRATORY_TRACT | 11 refills | Status: AC

## 2020-11-11 NOTE — Telephone Encounter (Signed)
Pt returning a phone call. Pt can be reached at [phone].

## 2020-11-11 NOTE — Telephone Encounter (Signed)
Called and spoke with patient. He is aware that I will leave the envelope downstairs at the front desk for him.   Nothing further needed at time of call.

## 2020-11-11 NOTE — Telephone Encounter (Signed)
Pt returning a phone call. Pt can be reached at [phone]

## 2020-11-11 NOTE — Telephone Encounter (Signed)
Attempted to call pt but unable to reach and unable to leave VM. Will try to call back later. 

## 2020-11-11 NOTE — Telephone Encounter (Signed)
Called and spoke with patient. He was requesting a copy of his most recent OV note and as well as the RX for the [facility] inhaler. He wants to take these documents to the [place] so his PCP can review everything. I advised him that I would print everything for him and stamp MH's name since [facility] is out of the office. Will call him back once I have everything at the front desk for him.

## 2020-11-11 NOTE — Telephone Encounter (Signed)
Called patient but he did not answer. His VM was full as well. I have printed all OV notes, PFT results and Wixela RX. Everything is labeled and as been placed in a manila envelope.   Will call patient again in a few minutes.

## 2020-11-15 ENCOUNTER — Telehealth: Payer: Self-pay | Admitting: Pulmonary Disease

## 2020-11-15 DIAGNOSIS — H401131 Primary open-angle glaucoma, bilateral, mild stage: Secondary | ICD-10-CM | POA: Diagnosis not present

## 2020-11-15 DIAGNOSIS — H16223 Keratoconjunctivitis sicca, not specified as Sjogren's, bilateral: Secondary | ICD-10-CM | POA: Diagnosis not present

## 2020-11-15 DIAGNOSIS — H0102A Squamous blepharitis right eye, upper and lower eyelids: Secondary | ICD-10-CM | POA: Diagnosis not present

## 2020-11-15 DIAGNOSIS — H0288B Meibomian gland dysfunction left eye, upper and lower eyelids: Secondary | ICD-10-CM | POA: Diagnosis not present

## 2020-11-15 DIAGNOSIS — H0288A Meibomian gland dysfunction right eye, upper and lower eyelids: Secondary | ICD-10-CM | POA: Diagnosis not present

## 2020-11-15 DIAGNOSIS — H0102B Squamous blepharitis left eye, upper and lower eyelids: Secondary | ICD-10-CM | POA: Diagnosis not present

## 2020-11-15 NOTE — Telephone Encounter (Signed)
Called and spoke with patient. He states the [facility] has his prescription there but can not fill it until they get authorization from patient's PCP. Patient states we need to send Rx to [facility] and she will call and give the ok to fill at the [place].    Rx faxed to number provided, Attn: Lelan Pons

## 2020-12-15 ENCOUNTER — Other Ambulatory Visit: Payer: Self-pay

## 2020-12-15 ENCOUNTER — Ambulatory Visit (INDEPENDENT_AMBULATORY_CARE_PROVIDER_SITE_OTHER): Payer: Medicare PPO | Admitting: Sports Medicine

## 2020-12-15 ENCOUNTER — Encounter: Payer: Self-pay | Admitting: Sports Medicine

## 2020-12-15 DIAGNOSIS — M2042 Other hammer toe(s) (acquired), left foot: Secondary | ICD-10-CM

## 2020-12-15 DIAGNOSIS — M79674 Pain in right toe(s): Secondary | ICD-10-CM | POA: Diagnosis not present

## 2020-12-15 DIAGNOSIS — E1142 Type 2 diabetes mellitus with diabetic polyneuropathy: Secondary | ICD-10-CM | POA: Diagnosis not present

## 2020-12-15 DIAGNOSIS — B351 Tinea unguium: Secondary | ICD-10-CM

## 2020-12-15 DIAGNOSIS — M79675 Pain in left toe(s): Secondary | ICD-10-CM

## 2020-12-15 DIAGNOSIS — M2141 Flat foot [pes planus] (acquired), right foot: Secondary | ICD-10-CM

## 2020-12-15 DIAGNOSIS — M2142 Flat foot [pes planus] (acquired), left foot: Secondary | ICD-10-CM

## 2020-12-15 DIAGNOSIS — M2041 Other hammer toe(s) (acquired), right foot: Secondary | ICD-10-CM

## 2020-12-15 NOTE — Progress Notes (Signed)
Subjective: [name] is a 87 y.o. male patient with history of diabetes who returns to office today complaining of long,mildly painful nails while ambulating in shoes; unable to trim. Patient states that the glucose reading this morning was not recorded.  No other issues noted.   Reports that his wife has died and he will be moving to [place] so this will be his last visit will be leaving this weekend.  Patient Active Problem List   Diagnosis Date Noted  . Gastroesophageal reflux disease without esophagitis 06/22/2016  . Constipation 06/22/2016  . Bladder cancer ([place]) 06/03/2014  . Iron deficiency anemia, unspecified 06/13/2012  . Sleep disorder 04/16/2012  . Nonsustained ventricular tachycardia ([place]) 09/17/2011  . Atypical chest pain 08/27/2011  . GOUT, ACUTE 07/16/2008  . RENAL CYST, RIGHT 07/16/2008  . TRANSAMINASES, SERUM, ELEVATED 07/16/2008  . DIABETES MELLITUS, TYPE II 04/12/2008  . PERIPHERAL EDEMA 04/12/2008  . POSTTRAUMATIC STRESS DISORDER 08/11/2007  . PERIPHERAL NEUROPATHY 08/11/2007  . HYPERTENSION 08/11/2007  . ALLERGIC RHINITIS 08/11/2007  . BENIGN PROSTATIC HYPERTROPHY 08/11/2007  . DEGENERATIVE JOINT DISEASE 08/11/2007  . OSTEOPOROSIS 08/11/2007  . COLON CANCER, HX OF 08/11/2007  . Other postprocedural status(V45.89) 08/11/2007   Current Outpatient Medications on File Prior to Visit  Medication Sig Dispense Refill  . acetaminophen (TYLENOL) 325 MG tablet Take 650 mg by mouth every 6 (six) hours as needed for mild pain.     Marland Kitchen allopurinol (ZYLOPRIM) 100 MG tablet     . amLODipine (NORVASC) 5 MG tablet     . ammonium lactate (LAC-HYDRIN) 12 % lotion Apply 1 application topically daily as needed for dry skin.    Marland Kitchen aspirin 81 MG tablet Take 81 mg by mouth daily as needed for pain.     Marland Kitchen atenolol (TENORMIN) 25 MG tablet Take 25 mg by mouth every morning.     Marland Kitchen atorvastatin (LIPITOR) 40 MG tablet Take 40 mg by mouth daily.    Marland Kitchen azelastine (ASTELIN) 0.1 %  nasal spray Place 1 spray into both nostrils 2 (two) times daily. Use in each nostril as directed 30 mL 12  . azithromycin (ZITHROMAX) 500 MG tablet Take 500 mg by mouth daily.    Marland Kitchen BETASEPT SURGICAL SCRUB 4 % external liquid     . bisacodyl (DULCOLAX) 10 MG suppository Place 10 mg rectally daily as needed for moderate constipation.    . brimonidine (ALPHAGAN P) 0.1 % SOLN     . brimonidine (ALPHAGAN) 0.2 % ophthalmic solution     . Coenzyme Q10 (CO Q 10) 100 MG CAPS Take 100 mg by mouth 2 (two) times daily.    . colchicine 0.6 MG tablet     . COMBIGAN 0.2-0.5 % ophthalmic solution     . dorzolamide (TRUSOPT) 2 % ophthalmic solution     . finasteride (PROSCAR) 5 MG tablet Take 5 mg by mouth at bedtime.    . fluticasone (FLONASE) 50 MCG/ACT nasal spray Place 2 sprays into both nostrils at bedtime. 16 g 6  . Fluticasone-Salmeterol (WIXELA INHUB) 250-50 MCG/DOSE AEPB Inhale 1 puff into the lungs 2 (two) times daily. 60 each 11  . gabapentin (NEURONTIN) 100 MG capsule Take 100 mg by mouth 3 (three) times daily.    Marland Kitchen gabapentin (NEURONTIN) 300 MG capsule     . hydrochlorothiazide (HYDRODIURIL) 25 MG tablet Take 25 mg by mouth daily.    Marland Kitchen ketoconazole (NIZORAL) 2 % cream Apply topically 2 (two) times daily.    Marland Kitchen  latanoprost (XALATAN) 0.005 % ophthalmic solution Place 1 drop into the right eye at bedtime.     Marland Kitchen lisinopril (PRINIVIL,ZESTRIL) 40 MG tablet Take 40 mg by mouth daily.    Marland Kitchen LUMIGAN 0.01 % SOLN     . metFORMIN (GLUCOPHAGE) 500 MG tablet Take by mouth.    . montelukast (SINGULAIR) 10 MG tablet Take 1 tablet (10 mg total) by mouth at bedtime. 30 tablet 11  . omega-3 acid ethyl esters (LOVAZA) 1 g capsule Take 1 g by mouth daily at 2 PM.    . omeprazole (PRILOSEC) 20 MG capsule Take 20 mg by mouth 2 (two) times daily.    Marland Kitchen omeprazole (PRILOSEC) 40 MG capsule Take 1 tablet twice daily. 180 capsule 3  . predniSONE (DELTASONE) 10 MG tablet TAKE 6 TABLETS ON DAY 1 THEN TAKE 5 TABLETS ON DAY 2  THEN TAKE 4 TABLETS ON DAY 3 THEN TAKE 3 TABLETS ON DAY 4 THEN TAKE 2 TABLETS ON DAY 5    . tamsulosin (FLOMAX) 0.4 MG CAPS capsule     . terazosin (HYTRIN) 2 MG capsule Take 2 mg by mouth at bedtime.    . timolol (TIMOPTIC) 0.5 % ophthalmic solution      No current facility-administered medications on file prior to visit.   No Known Allergies  No results found for this or any previous visit (from the past 2160 hour(s)).  Objective: General: Patient is awake, alert, and oriented x 3 and in no acute distress.  Integument: Skin is warm, dry and supple bilateral. Nails are tender, long, thickened and dystrophic with subungual debris, consistent with onychomycosis, 1-5 bilateral. No signs of infection. No open lesions or preulcerative lesions present bilateral, very minimal callus sub met 5 bilateral. Remaining integument unremarkable.  Vasculature:  Dorsalis Pedis pulse 1/4 bilateral. Posterior Tibial pulse  1/4 bilateral. Capillary fill time <3 sec 1-5 bilateral. Scant hair growth to the level of the digits.Temperature gradient within normal limits. No varicosities present bilateral. No edema present bilateral.   Neurology: The patient has intact sensation measured with a 5.07/10g Semmes Weinstein Monofilament at all pedal sites bilateral . Vibratory sensation diminished bilateral with tuning fork. No Babinski sign present bilateral.   Musculoskeletal:  Asymptomatic hammertoes and pes planus pedal deformities noted bilateral. Muscular strength 5/5 in all lower extremity bilateral. No tenderness with calf compression bilateral.  Assessment and Plan: Problem List Items Addressed This Visit   None   Visit Diagnoses    Pain due to onychomycosis of toenails of both feet    -  Primary   Diabetic polyneuropathy associated with type 2 diabetes mellitus (HCC)       Hammer toes of both feet       Pes planus of both feet          -Examined patient. -Re-discussed importance of daily foot  inspection in the setting of diabetes -Mechanically debrided all nails 1-5 bilateral using sterile nail nipper and filed with dremel without incident  -Answered all patient questions -Patient to return as needed or sooner problems or issues arise patient is moving away to [place] -Patient advised to call the office if any problems or questions arise in the meantime.  [name], DPM

## 2021-04-26 DIAGNOSIS — I10 Essential (primary) hypertension: Secondary | ICD-10-CM | POA: Diagnosis not present

## 2021-04-26 DIAGNOSIS — E785 Hyperlipidemia, unspecified: Secondary | ICD-10-CM | POA: Diagnosis not present

## 2021-04-26 DIAGNOSIS — G629 Polyneuropathy, unspecified: Secondary | ICD-10-CM | POA: Diagnosis not present

## 2021-04-26 DIAGNOSIS — M109 Gout, unspecified: Secondary | ICD-10-CM | POA: Diagnosis not present

## 2021-04-26 DIAGNOSIS — N400 Enlarged prostate without lower urinary tract symptoms: Secondary | ICD-10-CM | POA: Diagnosis not present

## 2021-04-26 DIAGNOSIS — M810 Age-related osteoporosis without current pathological fracture: Secondary | ICD-10-CM | POA: Diagnosis not present

## 2021-05-17 DIAGNOSIS — Z79899 Other long term (current) drug therapy: Secondary | ICD-10-CM | POA: Diagnosis not present

## 2021-05-17 DIAGNOSIS — E559 Vitamin D deficiency, unspecified: Secondary | ICD-10-CM | POA: Diagnosis not present

## 2021-05-17 DIAGNOSIS — E785 Hyperlipidemia, unspecified: Secondary | ICD-10-CM | POA: Diagnosis not present

## 2021-05-17 DIAGNOSIS — N400 Enlarged prostate without lower urinary tract symptoms: Secondary | ICD-10-CM | POA: Diagnosis not present

## 2021-05-17 DIAGNOSIS — M109 Gout, unspecified: Secondary | ICD-10-CM | POA: Diagnosis not present

## 2021-05-23 DIAGNOSIS — M810 Age-related osteoporosis without current pathological fracture: Secondary | ICD-10-CM | POA: Diagnosis not present

## 2021-05-23 DIAGNOSIS — R7303 Prediabetes: Secondary | ICD-10-CM | POA: Diagnosis not present

## 2021-05-23 DIAGNOSIS — E785 Hyperlipidemia, unspecified: Secondary | ICD-10-CM | POA: Diagnosis not present

## 2021-05-23 DIAGNOSIS — D72819 Decreased white blood cell count, unspecified: Secondary | ICD-10-CM | POA: Diagnosis not present

## 2021-05-23 DIAGNOSIS — I10 Essential (primary) hypertension: Secondary | ICD-10-CM | POA: Diagnosis not present

## 2021-08-08 DIAGNOSIS — E785 Hyperlipidemia, unspecified: Secondary | ICD-10-CM | POA: Diagnosis not present

## 2021-08-08 DIAGNOSIS — H8391 Unspecified disease of right inner ear: Secondary | ICD-10-CM | POA: Diagnosis not present

## 2021-08-08 DIAGNOSIS — I10 Essential (primary) hypertension: Secondary | ICD-10-CM | POA: Diagnosis not present

## 2021-08-08 DIAGNOSIS — R7303 Prediabetes: Secondary | ICD-10-CM | POA: Diagnosis not present

## 2021-08-08 DIAGNOSIS — L608 Other nail disorders: Secondary | ICD-10-CM | POA: Diagnosis not present

## 2022-08-27 IMAGING — DX DG CHEST 2V
2 series · 2 of 2 positions shown · non-contrast
Comparison: Two-view chest x-ray 07/09/2008. CT of the chest
09/07/14

CLINICAL DATA: Dyspnea on exertion.  Former smoker.

EXAM:
CHEST - 2 VIEW

[chest pa]
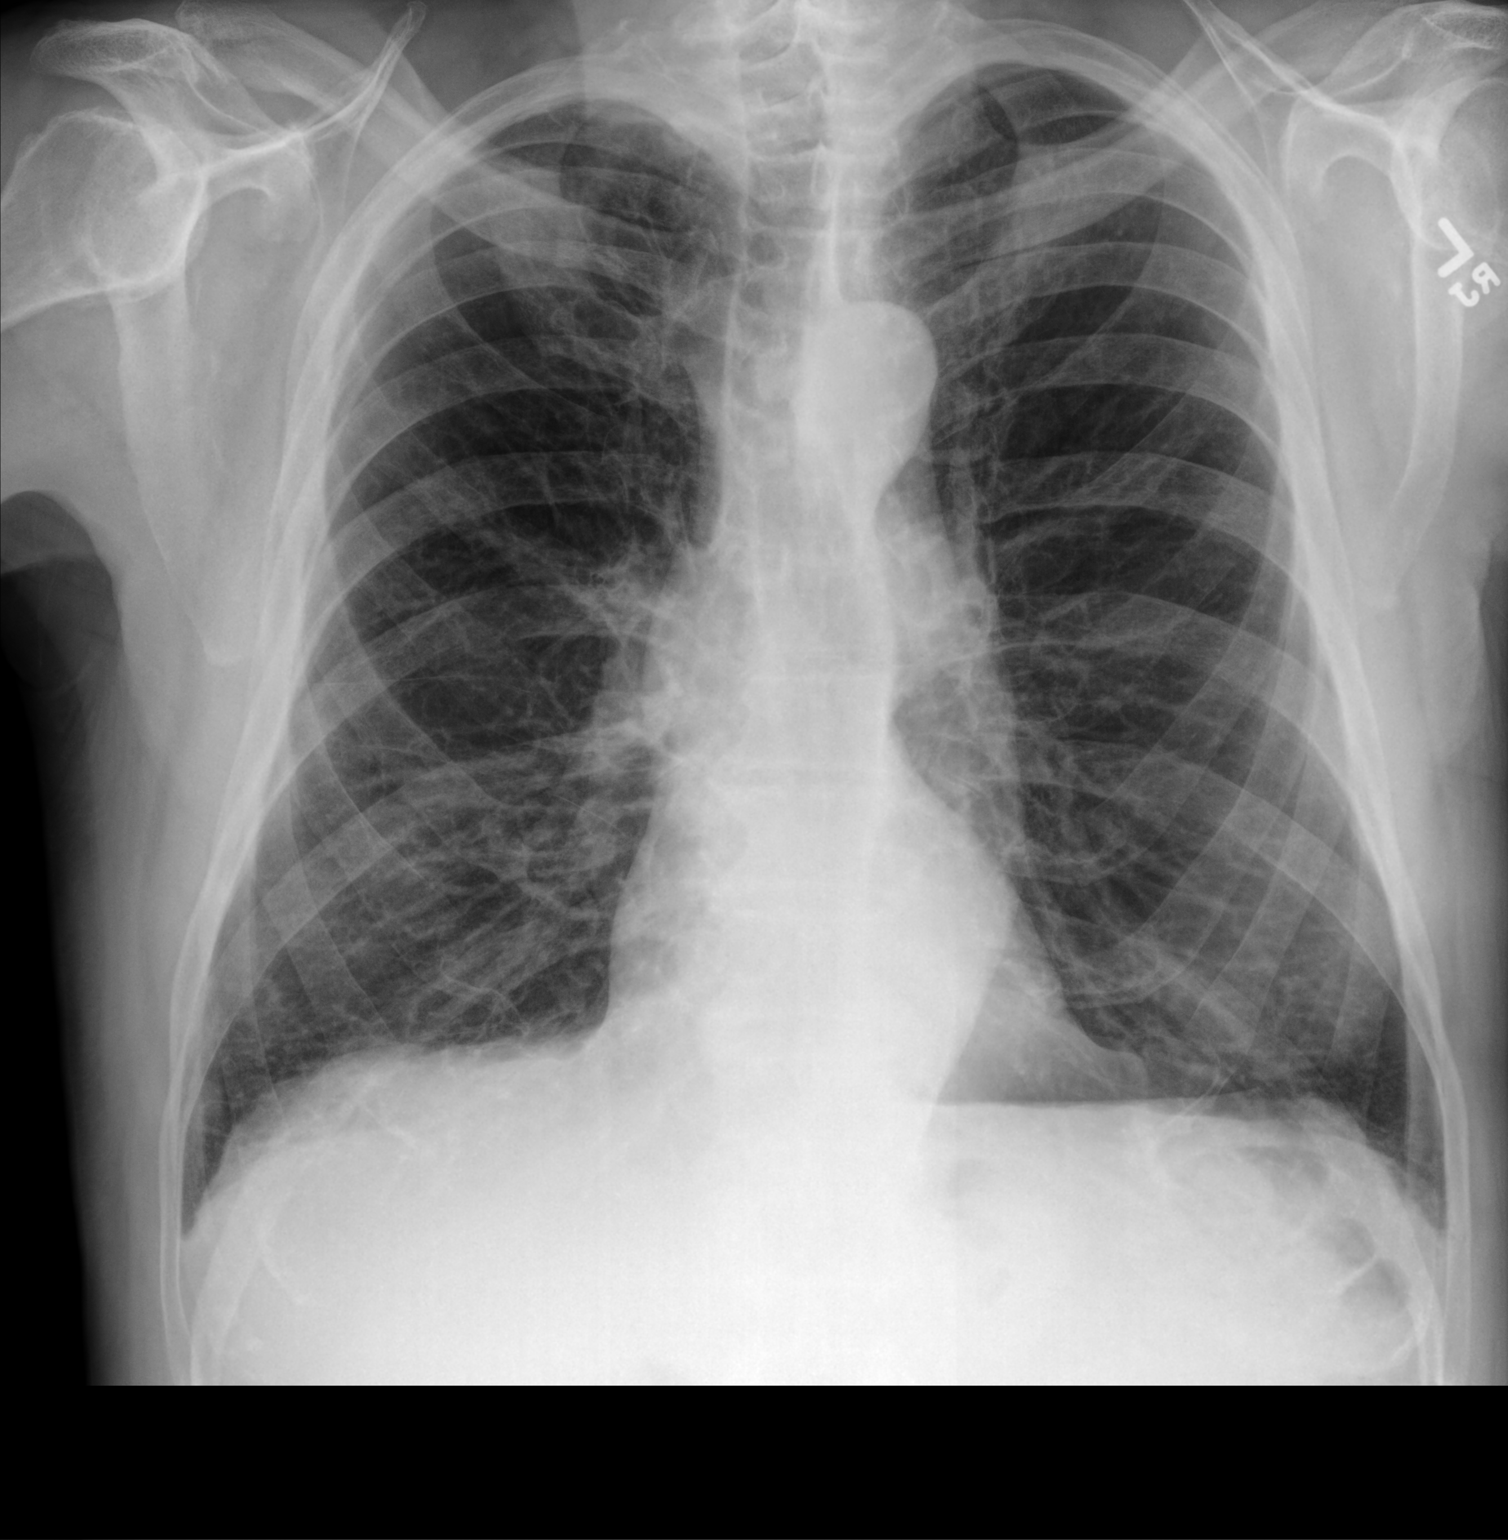

[chest lat]
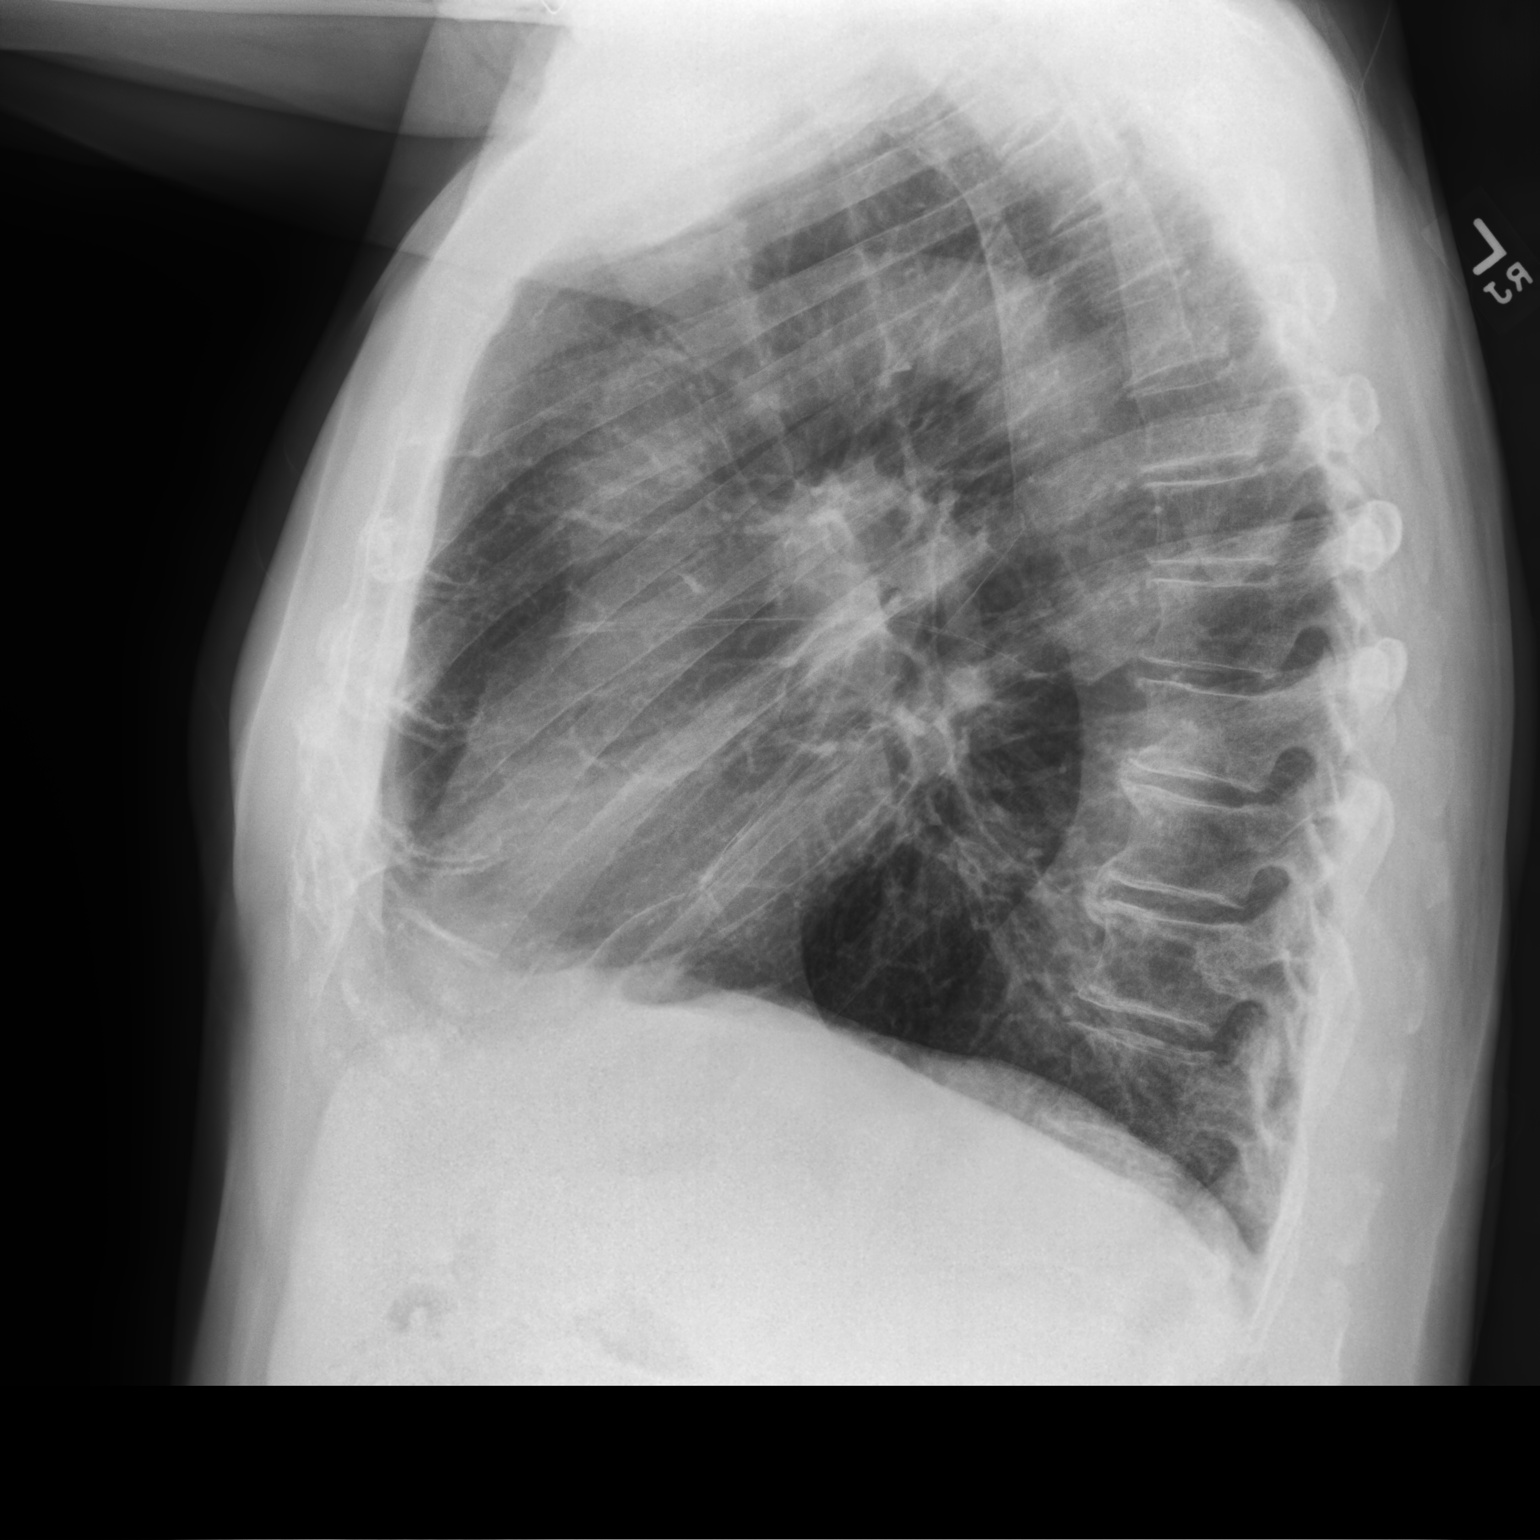

[2 of 2 positions shown; findings below may reference images not displayed]

FINDINGS: Emphysematous changes are again noted both lungs. No edema or
effusion is present. No focal airspace disease is present. Heart
size is normal. Atherosclerotic changes are noted at the aortic
arch. Axial skeleton is unremarkable.
IMPRESSION: 1. No acute cardiopulmonary disease or significant interval change.
2. Emphysema.
3. Aortic atherosclerosis.
